# Patient Record
Sex: Female | Born: 1950 | Race: Asian | Hispanic: No | Smoking: Never smoker
Health system: Southern US, Community
[De-identification: ages and names within clinical notes are randomized; demographics above are authoritative.]

## PROBLEM LIST (undated history)

## (undated) DIAGNOSIS — E119 Type 2 diabetes mellitus without complications: Secondary | ICD-10-CM

## (undated) DIAGNOSIS — R0989 Other specified symptoms and signs involving the circulatory and respiratory systems: Secondary | ICD-10-CM

## (undated) DIAGNOSIS — I10 Essential (primary) hypertension: Secondary | ICD-10-CM

## (undated) DIAGNOSIS — E785 Hyperlipidemia, unspecified: Secondary | ICD-10-CM

## (undated) HISTORY — DX: Hyperlipidemia, unspecified: E78.5

## (undated) HISTORY — DX: Essential (primary) hypertension: I10

## (undated) HISTORY — DX: Type 2 diabetes mellitus without complications: E11.9

## (undated) HISTORY — DX: Other specified symptoms and signs involving the circulatory and respiratory systems: R09.89

---

## 2000-11-11 ENCOUNTER — Ambulatory Visit (HOSPITAL_COMMUNITY): Admission: RE | Admit: 2000-11-11 | Discharge: 2000-11-11 | Payer: Self-pay | Admitting: Obstetrics & Gynecology

## 2001-10-22 ENCOUNTER — Encounter: Payer: Self-pay | Admitting: Internal Medicine

## 2001-10-22 ENCOUNTER — Observation Stay (HOSPITAL_COMMUNITY): Admission: AD | Admit: 2001-10-22 | Discharge: 2001-10-23 | Payer: Self-pay | Admitting: Internal Medicine

## 2001-11-02 ENCOUNTER — Ambulatory Visit (HOSPITAL_COMMUNITY): Admission: RE | Admit: 2001-11-02 | Discharge: 2001-11-02 | Payer: Self-pay | Admitting: Gastroenterology

## 2001-11-12 ENCOUNTER — Ambulatory Visit (HOSPITAL_COMMUNITY): Admission: RE | Admit: 2001-11-12 | Discharge: 2001-11-12 | Payer: Self-pay | Admitting: Internal Medicine

## 2002-11-22 ENCOUNTER — Encounter: Payer: Self-pay | Admitting: Internal Medicine

## 2002-11-22 ENCOUNTER — Ambulatory Visit (HOSPITAL_COMMUNITY): Admission: RE | Admit: 2002-11-22 | Discharge: 2002-11-22 | Payer: Self-pay | Admitting: Internal Medicine

## 2002-11-23 ENCOUNTER — Other Ambulatory Visit: Admission: RE | Admit: 2002-11-23 | Discharge: 2002-11-23 | Payer: Self-pay | Admitting: Obstetrics and Gynecology

## 2003-12-01 ENCOUNTER — Ambulatory Visit (HOSPITAL_COMMUNITY): Admission: RE | Admit: 2003-12-01 | Discharge: 2003-12-01 | Payer: Self-pay | Admitting: Internal Medicine

## 2003-12-07 ENCOUNTER — Other Ambulatory Visit: Admission: RE | Admit: 2003-12-07 | Discharge: 2003-12-07 | Payer: Self-pay | Admitting: Obstetrics and Gynecology

## 2004-12-13 ENCOUNTER — Ambulatory Visit (HOSPITAL_COMMUNITY): Admission: RE | Admit: 2004-12-13 | Discharge: 2004-12-13 | Payer: Self-pay | Admitting: Internal Medicine

## 2004-12-24 ENCOUNTER — Other Ambulatory Visit: Admission: RE | Admit: 2004-12-24 | Discharge: 2004-12-24 | Payer: Self-pay | Admitting: Obstetrics and Gynecology

## 2005-06-17 DIAGNOSIS — I10 Essential (primary) hypertension: Secondary | ICD-10-CM

## 2005-06-17 HISTORY — DX: Essential (primary) hypertension: I10

## 2005-12-16 ENCOUNTER — Ambulatory Visit (HOSPITAL_COMMUNITY): Admission: RE | Admit: 2005-12-16 | Discharge: 2005-12-16 | Payer: Self-pay | Admitting: Internal Medicine

## 2005-12-25 ENCOUNTER — Other Ambulatory Visit: Admission: RE | Admit: 2005-12-25 | Discharge: 2005-12-25 | Payer: Self-pay | Admitting: Obstetrics and Gynecology

## 2006-12-22 ENCOUNTER — Ambulatory Visit (HOSPITAL_COMMUNITY): Admission: RE | Admit: 2006-12-22 | Discharge: 2006-12-22 | Payer: Self-pay | Admitting: Internal Medicine

## 2006-12-29 ENCOUNTER — Other Ambulatory Visit: Admission: RE | Admit: 2006-12-29 | Discharge: 2006-12-29 | Payer: Self-pay | Admitting: Obstetrics and Gynecology

## 2008-01-11 ENCOUNTER — Ambulatory Visit (HOSPITAL_COMMUNITY): Admission: RE | Admit: 2008-01-11 | Discharge: 2008-01-11 | Payer: Self-pay | Admitting: Internal Medicine

## 2009-01-26 ENCOUNTER — Ambulatory Visit (HOSPITAL_COMMUNITY): Admission: RE | Admit: 2009-01-26 | Discharge: 2009-01-26 | Payer: Self-pay | Admitting: Internal Medicine

## 2010-02-27 ENCOUNTER — Ambulatory Visit (HOSPITAL_COMMUNITY): Admission: RE | Admit: 2010-02-27 | Discharge: 2010-02-27 | Payer: Self-pay | Admitting: Internal Medicine

## 2010-07-09 ENCOUNTER — Encounter: Payer: Self-pay | Admitting: Internal Medicine

## 2010-11-02 NOTE — Procedures (Signed)
[place]  Patient:    Cindy Ross, Cindy Ross Visit Number: [id number] MRN: [id number]          Service Type: END Location: ENDO Attending Physician:  Charna Elizabeth Dictated by:   Anselmo Rod, M.D. Proc. Date: 11/02/01 Admit Date:  11/02/2001 Discharge Date: 11/02/2001   CC:         Kern Reap, M.D.   Procedure Report  DATE OF BIRTH:  04-Feb-1951  REFERRING PHYSICIAN:  Kern Reap, M.D.  PROCEDURE PERFORMED:  Esophagogastroduodenoscopy.  ENDOSCOPIST:  Anselmo Rod, M.D.  INSTRUMENT USED:  Olympus video panendoscope.  INDICATIONS FOR PROCEDURE:  Iron deficiency anemia with hemoglobin down to 6.1 gm/dl requiring three units for packed cells in a 60 year old Asian female.  Rule out peptic ulcer disease, esophagitis, gastritis, etc.  PREPROCEDURE PREPARATION:  Informed consent was procured from the patient. The patient was fasted for eight hours prior to the procedure.  PREPROCEDURE PHYSICAL:  The patient had stable vital signs.  Neck supple. Chest clear to auscultation.  S1, S2 regular.  Abdomen soft with normal abdominal bowel sounds.  DESCRIPTION OF PROCEDURE:  The patient was placed in left lateral decubitus position and sedated with 70 mg of Demerol and 7 mg of Versed intravenously. Once the patient was adequately sedated and maintained on low-flow oxygen and continuous cardiac monitoring, the Olympus video panendoscope was advanced through the mouthpiece, over the tongue, into the esophagus under direct vision.  The entire esophagus appeared normal and without lesions.  On advancing the scope into the stomach, a small hiatal hernia was seen on high retroflexion.  The rest of the gastric mucosa and the proximal small bowel appeared normal.  IMPRESSION:  Normal esophagogastroduodenoscopy except for a small hiatal hernia.  RECOMMENDATION: 1. Proceed with a colonoscopy at this time. Dictated by:   Anselmo Rod,  M.D. Attending Physician:  Charna Elizabeth DD:  11/03/01 TD:  11/04/01 Job: [id number]

## 2010-11-02 NOTE — Procedures (Signed)
[place]  Patient:    Cindy Ross, Cindy Ross Visit Number: [id number] MRN: [id number]          Service Type: END Location: ENDO Attending Physician:  Charna Elizabeth Dictated by:   Anselmo Rod, M.D. Proc. Date: 11/02/01 Admit Date:  11/02/2001 Discharge Date: 11/02/2001   CC:         Kern Reap, M.D.   Procedure Report  DATE OF BIRTH:  Dec 03, 1950  PROCEDURE PERFORMED:  Colonoscopy.  ENDOSCOPIST:  Anselmo Rod, M.D.  INSTRUMENT USED:  Olympus video colonoscope (adjustable pediatric scope).  INDICATIONS FOR PROCEDURE:  Iron-deficiency anemia in a 60 year old Asian female, rule out colonic polyps, masses, hemorrhoids, etc.  PREPROCEDURE PREPARATION:  Informed consent was procured from the patient. The patient was fasted for eight hours prior to the procedure, and prepped with a bottle of magnesium citrate and a gallon of NuLytely the night prior to the procedure.  PREPROCEDURE PHYSICAL:  VITAL SIGNS:  Stable.  NECK:  Supple.  CHEST:  Clear to auscultation, S1 and S2 regular.  ABDOMEN:  Soft with normal bowel sounds.  DESCRIPTION OF PROCEDURE:  The patient was placed in the left lateral decubitus position and sedated with Demerol and Versed for the EGD.  No additional sedation was used for the colonoscopy.  Once the patient was adequately sedated, maintained on low flow oxygen and continuous cardiac monitoring, the Olympus video colonoscope was advanced from the rectum to the cecum and terminal ileum without difficulty, except for nonbleeding small internal hemorrhoids seen on retroflexion.  The entire colonic mucosa up to the cecum appeared healthy.  The terminal ileum was normal as well as without lesions.  IMPRESSION: 1. Normal colonoscopy up to the terminal ileum. 2. Small nonbleeding internal hemorrhoids.  RECOMMENDATIONS: 1. A gynecological evaluation has been recommended for the patient. 2. Continue serial CBCs. 3.  Outpatient follow up in the next seven to ten days. Dictated by:   Anselmo Rod, M.D. Attending Physician:  Charna Elizabeth DD:  11/03/01 TD:  11/04/01 Job: [id number]

## 2011-02-07 ENCOUNTER — Other Ambulatory Visit (HOSPITAL_COMMUNITY): Payer: Self-pay | Admitting: Family Medicine

## 2011-02-07 DIAGNOSIS — Z1231 Encounter for screening mammogram for malignant neoplasm of breast: Secondary | ICD-10-CM

## 2011-03-04 ENCOUNTER — Ambulatory Visit (HOSPITAL_COMMUNITY)
Admission: RE | Admit: 2011-03-04 | Discharge: 2011-03-04 | Disposition: A | Discharge status: Home / Self Care | Payer: BC Managed Care – PPO | Source: Ambulatory Visit | Attending: Family Medicine | Admitting: Family Medicine

## 2011-03-04 DIAGNOSIS — Z1231 Encounter for screening mammogram for malignant neoplasm of breast: Secondary | ICD-10-CM | POA: Insufficient documentation

## 2012-03-03 ENCOUNTER — Other Ambulatory Visit (HOSPITAL_COMMUNITY): Payer: Self-pay | Admitting: Internal Medicine

## 2012-03-03 DIAGNOSIS — Z1231 Encounter for screening mammogram for malignant neoplasm of breast: Secondary | ICD-10-CM

## 2012-03-13 ENCOUNTER — Ambulatory Visit (HOSPITAL_COMMUNITY)
Admission: RE | Admit: 2012-03-13 | Discharge: 2012-03-13 | Disposition: A | Discharge status: Home / Self Care | Payer: BC Managed Care – PPO | Source: Ambulatory Visit | Attending: Internal Medicine | Admitting: Internal Medicine

## 2012-03-13 DIAGNOSIS — Z1231 Encounter for screening mammogram for malignant neoplasm of breast: Secondary | ICD-10-CM | POA: Insufficient documentation

## 2013-04-29 ENCOUNTER — Other Ambulatory Visit (HOSPITAL_COMMUNITY): Payer: Self-pay | Admitting: Internal Medicine

## 2013-04-29 DIAGNOSIS — Z1231 Encounter for screening mammogram for malignant neoplasm of breast: Secondary | ICD-10-CM

## 2013-05-19 ENCOUNTER — Ambulatory Visit (HOSPITAL_COMMUNITY)
Admission: RE | Admit: 2013-05-19 | Discharge: 2013-05-19 | Disposition: A | Discharge status: Home / Self Care | Payer: Self-pay | Source: Ambulatory Visit | Attending: Internal Medicine | Admitting: Internal Medicine

## 2013-05-19 DIAGNOSIS — Z1231 Encounter for screening mammogram for malignant neoplasm of breast: Secondary | ICD-10-CM

## 2013-11-25 ENCOUNTER — Other Ambulatory Visit: Payer: Self-pay | Admitting: Physician Assistant

## 2013-11-25 ENCOUNTER — Ambulatory Visit (INDEPENDENT_AMBULATORY_CARE_PROVIDER_SITE_OTHER): Payer: BC Managed Care – PPO | Admitting: Family Medicine

## 2013-11-25 ENCOUNTER — Ambulatory Visit (INDEPENDENT_AMBULATORY_CARE_PROVIDER_SITE_OTHER): Payer: BC Managed Care – PPO

## 2013-11-25 VITALS — BP 125/78 | HR 60 | Temp 97.7°F | Resp 18 | Ht 61.0 in | Wt 146.0 lb

## 2013-11-25 DIAGNOSIS — M25561 Pain in right knee: Secondary | ICD-10-CM

## 2013-11-25 DIAGNOSIS — M25569 Pain in unspecified knee: Secondary | ICD-10-CM

## 2013-11-25 MED ORDER — MELOXICAM 15 MG PO TABS: 15.0000 mg | ORAL_TABLET | Freq: Every day | ORAL | Status: DC

## 2013-11-25 NOTE — Progress Notes (Signed)
   Subjective:    Patient ID: Cindy Ross, female    DOB: 10/01/1950, 63 y.o.   MRN: [id number]  HPI   Cindy Ross is a very pleasant 62 yr old female here with complaint of RIGHT knee pain for 1 week.  She woke up one morning with knee pain - could hardly walk due to pain - ?locking, catching but pt cannot articulate this.  There is a language barrier.  She denies injury to the knee.  She denies prior knee problems.  She has not currently having difficulty walking.  She works at [employer] and is on her feet during the day.  She has not taken any medicine for the knee.  She otherwise feels well today.     Review of Systems  Constitutional: Negative for fever and chills.  Respiratory: Negative.   Cardiovascular: Negative.   Gastrointestinal: Negative.   Musculoskeletal: Positive for arthralgias.  Skin: Negative.        Objective:   Physical Exam  Vitals reviewed. Constitutional: She is oriented to person, place, and time. She appears well-developed and well-nourished. No distress.  HENT:  Head: Normocephalic and atraumatic.  Eyes: Conjunctivae are normal. No scleral icterus.  Cardiovascular: Intact distal pulses.   Pulmonary/Chest: Effort normal.  Musculoskeletal:       Right knee: She exhibits swelling (slight, compared with left). She exhibits normal range of motion, no ecchymosis, no deformity, no erythema and no LCL laxity. Tenderness (anterior knee) found.       Legs: Full PROM; negative anterior drawer; negative McMurray's; no laxity; mild TTP anterior knee; full WB; normal gait  Neurological: She is alert and oriented to person, place, and time.  Skin: Skin is warm and dry.  Psychiatric: She has a normal mood and affect. Her behavior is normal.     [facility] reading (PRIMARY) by  Dr. Nyoka Cowden - well preserved joint space; ?flabella vs bony island off lateral femoral condyle; lateral patellar tilt       Assessment & Plan:  Right knee pain - Plan: meloxicam (MOBIC) 15 MG tablet,  CANCELED: DG Knee Complete 4 Views Right   Cindy Ross is a very pleasant 63 yr old female here with 1 wk of RIGHT knee pain.  Suspect degenerative disease.  Xrays with no acute findings today.  Will start mobic once daily.  Pt placed in hinged knee brace.  If she is worsening or not improving in the next 1-2 weeks she will RTC  Pt to call or RTC if worsening or not improving  E. Natividad Brood MHS, PA-C [facility] 6/11/201512:53 PM

## 2013-11-25 NOTE — Patient Instructions (Signed)
Your xrays of your knee look ok today  Wear the knee brace for about the next week  Take the meloxicam (Mobic) once daily for the next 1-2 weeks.  Do not take any additional ibuprofen or aleve with this medicine  Apply ice to the need 1-2 times per day  If your knee is worsening or not getting better, please come back   Knee Pain Knee pain can be a result of an injury or other medical conditions. Treatment will depend on the cause of your pain. HOME CARE  Only take medicine as told by your doctor.  Keep a healthy weight. Being overweight can make the knee hurt more.  Stretch before exercising or playing sports.  If there is constant knee pain, change the way you exercise. Ask your doctor for advice.  Make sure shoes fit well. Choose the right shoe for the sport or activity.  Protect your knees. Wear kneepads if needed.  Rest when you are tired. GET HELP RIGHT AWAY IF:   Your knee pain does not stop.  Your knee pain does not get better.  Your knee joint feels hot to the touch.  You have a fever. MAKE SURE YOU:   Understand these instructions.  Will watch this condition.  Will get help right away if you are not doing well or get worse. Document Released: 08/30/2008 Document Revised: 08/26/2011 Document Reviewed: 08/30/2008 [facility] Patient Information 2014 [place], [place].

## 2013-11-25 NOTE — Progress Notes (Signed)
Xray read and patient discussed with Ms. Elana Alm. Agree with assessment and plan of care per her note.

## 2014-04-18 ENCOUNTER — Other Ambulatory Visit (HOSPITAL_COMMUNITY): Payer: Self-pay | Admitting: Internal Medicine

## 2014-04-18 DIAGNOSIS — Z1231 Encounter for screening mammogram for malignant neoplasm of breast: Secondary | ICD-10-CM

## 2014-05-23 ENCOUNTER — Ambulatory Visit (HOSPITAL_COMMUNITY)
Admission: RE | Admit: 2014-05-23 | Discharge: 2014-05-23 | Disposition: A | Discharge status: Home / Self Care | Payer: BC Managed Care – PPO | Source: Ambulatory Visit | Attending: Internal Medicine | Admitting: Internal Medicine

## 2014-05-23 DIAGNOSIS — Z1231 Encounter for screening mammogram for malignant neoplasm of breast: Secondary | ICD-10-CM | POA: Insufficient documentation

## 2015-04-17 ENCOUNTER — Other Ambulatory Visit: Payer: Self-pay

## 2015-04-17 DIAGNOSIS — Z1231 Encounter for screening mammogram for malignant neoplasm of breast: Secondary | ICD-10-CM

## 2015-05-25 ENCOUNTER — Ambulatory Visit
Admission: RE | Admit: 2015-05-25 | Discharge: 2015-05-25 | Disposition: A | Discharge status: Home / Self Care | Payer: BLUE CROSS/BLUE SHIELD | Source: Ambulatory Visit

## 2015-05-25 DIAGNOSIS — Z1231 Encounter for screening mammogram for malignant neoplasm of breast: Secondary | ICD-10-CM

## 2015-11-28 ENCOUNTER — Encounter: Payer: Self-pay | Admitting: Internal Medicine

## 2015-11-28 ENCOUNTER — Ambulatory Visit (INDEPENDENT_AMBULATORY_CARE_PROVIDER_SITE_OTHER): Payer: Self-pay | Admitting: Internal Medicine

## 2015-11-28 VITALS — BP 150/100 | HR 80 | Resp 16 | Ht 61.0 in | Wt 141.0 lb

## 2015-11-28 DIAGNOSIS — K029 Dental caries, unspecified: Secondary | ICD-10-CM

## 2015-11-28 DIAGNOSIS — E118 Type 2 diabetes mellitus with unspecified complications: Secondary | ICD-10-CM

## 2015-11-28 DIAGNOSIS — I10 Essential (primary) hypertension: Secondary | ICD-10-CM

## 2015-11-28 LAB — GLUCOSE, POCT (MANUAL RESULT ENTRY): POC Glucose: 142 mg/dl — AB (ref 70–99)

## 2015-11-28 MED ORDER — GLUCOSE BLOOD VI STRP: ORAL_STRIP | Status: DC

## 2015-11-28 MED ORDER — METFORMIN HCL 1000 MG PO TABS: 1000.0000 mg | ORAL_TABLET | Freq: Two times a day (BID) | ORAL | Status: DC

## 2015-11-28 MED ORDER — LOSARTAN POTASSIUM-HCTZ 50-12.5 MG PO TABS: 1.0000 | ORAL_TABLET | Freq: Every day | ORAL | Status: DC

## 2015-11-28 MED ORDER — AGAMATRIX PRESTO PRO METER DEVI: Status: DC

## 2015-11-28 NOTE — Progress Notes (Signed)
   Subjective:    Patient ID: Cindy Ross, female    DOB: 06-08-1951, 65 y.o.   MRN: [id number]  HPI   New patient to establish. Previously follow by Dr. Priscille Loveless  1.  Essential Hypertension:  For over 10 years.  Has not taken medication for about 3 years. Called [facility] and they state she was on Losartan/HCTZ 50/12.5 mg  2.  DM Type 2:  Unknown length of time.  Has not taken Metformin 1000 mg twice daily since about 12.2016. Last eye check with Dr. Alois Cliche [phone].  Was told everything was fine. Did receive a flu vaccine this past winter Does not believe she has had a pneumovax. Denies renal, eye, cardiac, neurologic injury due to DM. Last blood work in October 2016.  Meds:  None currently  Allergies:  NKDA--though later gives a history that sounds like an ACE I cough  Past Medical History  Diagnosis Date  . Diabetes mellitus without complication ([place])   . Hypertension 2007   No past surgical history on file.   Family History  Problem Relation Age of Onset  . Hyperlipidemia Sister   . Diabetes Sister   . Hypertension Sister   . Heart disease Brother   . Liver disease Brother    Social History   Social History  . Marital Status: Widowed    Spouse Name: N/A  . Number of Children: 1  . Years of Education: 12   Occupational History  . unemployed     Worked for [employer] in past   Social History Main Topics  . Smoking status: Never Smoker   . Smokeless tobacco: Never Used  . Alcohol Use: No  . Drug Use: No  . Sexual Activity: No   Other Topics Concern  . Not on file   Social History Narrative   Originally from [place]   Came to [place]. In 16   Lives with younger sister, Freida Timmermans         Review of Systems     Objective:   Physical Exam NAD HEENT:  Pinguecula, left nasal iris with arcus senilis bilaterally.  TMs pearly gray, throat without injection, diffuse dental decay. Neck:  Supple, no adenopathy no thyromegaly Chest:  CTA CV:  RRR with normal  S1 and S2, No S3, S4 or murmur, no carotid bruits, carotid, radial and DP pulses normal and equal. LE:  No edema       Assessment & Plan:  1.  Essential Hypertension:  Restart LOsartan/HCT at [place].  CMP in [redacted] week along with other fasting labs  2.  DM type 2:  Restart Metformin.  A1C and FLP in 1 week  3.  Dental decay:  Dental referral  Send for old records

## 2015-12-07 ENCOUNTER — Other Ambulatory Visit (INDEPENDENT_AMBULATORY_CARE_PROVIDER_SITE_OTHER): Payer: Self-pay

## 2015-12-07 VITALS — BP 124/70 | HR 66

## 2015-12-07 DIAGNOSIS — I10 Essential (primary) hypertension: Secondary | ICD-10-CM

## 2015-12-07 DIAGNOSIS — E118 Type 2 diabetes mellitus with unspecified complications: Secondary | ICD-10-CM

## 2015-12-08 LAB — COMPREHENSIVE METABOLIC PANEL
ALT: 60 IU/L — ABNORMAL HIGH (ref 0–32)
AST: 42 IU/L — ABNORMAL HIGH (ref 0–40)
Albumin/Globulin Ratio: 1.2 (ref 1.2–2.2)
Albumin: 4.3 g/dL (ref 3.6–4.8)
Alkaline Phosphatase: 91 IU/L (ref 39–117)
BUN/Creatinine Ratio: 25 (ref 12–28)
BUN: 13 mg/dL (ref 8–27)
Bilirubin Total: 0.5 mg/dL (ref 0.0–1.2)
CO2: 21 mmol/L (ref 18–29)
Calcium: 9.9 mg/dL (ref 8.7–10.3)
Chloride: 99 mmol/L (ref 96–106)
Creatinine, Ser: 0.53 mg/dL — ABNORMAL LOW (ref 0.57–1.00)
GFR calc Af Amer: 117 mL/min/{1.73_m2} (ref >59)
GFR calc non Af Amer: 101 mL/min/{1.73_m2} (ref >59)
Globulin, Total: 3.5 g/dL (ref 1.5–4.5)
Glucose: 131 mg/dL — ABNORMAL HIGH (ref 65–99)
Potassium: 3.9 mmol/L (ref 3.5–5.2)
Sodium: 140 mmol/L (ref 134–144)
Total Protein: 7.8 g/dL (ref 6.0–8.5)

## 2015-12-08 LAB — HGB A1C W/O EAG: Hgb A1c MFr Bld: 9.1 % — ABNORMAL HIGH (ref 4.8–5.6)

## 2015-12-08 LAB — LIPID PANEL W/O CHOL/HDL RATIO
Cholesterol, Total: 198 mg/dL (ref 100–199)
HDL: 34 mg/dL — ABNORMAL LOW (ref >39)
LDL Calculated: 100 mg/dL — ABNORMAL HIGH (ref 0–99)
Triglycerides: 319 mg/dL — ABNORMAL HIGH (ref 0–149)
VLDL Cholesterol Cal: 64 mg/dL — ABNORMAL HIGH (ref 5–40)

## 2016-01-30 ENCOUNTER — Encounter: Payer: Self-pay | Admitting: Internal Medicine

## 2016-01-30 ENCOUNTER — Ambulatory Visit (INDEPENDENT_AMBULATORY_CARE_PROVIDER_SITE_OTHER): Payer: Self-pay | Admitting: Internal Medicine

## 2016-01-30 VITALS — BP 120/70 | HR 68 | Resp 16 | Ht 61.0 in | Wt 138.0 lb

## 2016-01-30 DIAGNOSIS — E119 Type 2 diabetes mellitus without complications: Secondary | ICD-10-CM

## 2016-01-30 DIAGNOSIS — E785 Hyperlipidemia, unspecified: Secondary | ICD-10-CM

## 2016-01-30 DIAGNOSIS — I10 Essential (primary) hypertension: Secondary | ICD-10-CM

## 2016-01-30 DIAGNOSIS — E118 Type 2 diabetes mellitus with unspecified complications: Secondary | ICD-10-CM

## 2016-01-30 DIAGNOSIS — Z23 Encounter for immunization: Secondary | ICD-10-CM

## 2016-01-30 HISTORY — DX: Hyperlipidemia, unspecified: E78.5

## 2016-01-30 LAB — GLUCOSE, POCT (MANUAL RESULT ENTRY): POC Glucose: 116 mg/dl — AB (ref 70–99)

## 2016-01-30 NOTE — Patient Instructions (Signed)
Keep track of your sugars and bring them in at each visit.

## 2016-01-30 NOTE — Progress Notes (Signed)
Subjective:    Patient ID: Cindy Ross, female    DOB: March 07, 1951, 65 y.o.   MRN: [id number]  HPI   1.  DM Type 2:  Only checking sugars 3 times weekly.  Sugars when checked are never higher than 115, even in the evening.  Taking Metformin twice daily  No polydipsia. No polyuria.  Last eye check in December. Dr. Alois Cliche, [address]  No diabetic changes. Glasses working well for her Last Flu vaccine in September of 2016 Has never had a pneumovax No numbness or tingling in hands or feet.  2.  Essential Hypertension:  Taking Losartan HCTZ regularly.  No problems.  Walks about 1/2 hour 3 times daily.  3.  Elevated Liver Enzymes:  Mild with labs in June.  No history of jaundice with illness when younger.  No history of hepatitis.  Does not drink alcohol.  Hepatic Function Latest Ref Rng & Units 12/07/2015  Total Protein 6.0 - 8.5 g/dL 7.8  Albumin 3.6 - 4.8 g/dL 4.3  AST 0 - 40 IU/L 42(H)  ALT 0 - 32 IU/L 60(H)  Alk Phosphatase 39 - 117 IU/L 91  Total Bilirubin 0.0 - 1.2 mg/dL 0.5   4.  Hyperlipidemia:  Total just under goal.  Trigs and LDL high and HDL low.  This just as restarting meds.  Lipid Panel     Component Value Date/Time   CHOL 198 12/07/2015 0929   TRIG 319 (H) 12/07/2015 0929   HDL 34 (L) 12/07/2015 0929   LDLCALC 100 (H) 12/07/2015 0929   Current Meds  Medication Sig  . Blood Glucose Monitoring Suppl (AGAMATRIX PRESTO PRO METER) DEVI Check sugars twice daily  . cholecalciferol (VITAMIN D) 1000 units tablet Take 1,000 Units by mouth daily.  Marland Kitchen glucose blood (AGAMATRIX PRESTO TEST) test strip Twice daily glucose checks  . losartan-hydrochlorothiazide (HYZAAR) 50-12.5 MG tablet Take 1 tablet by mouth daily.  . metFORMIN (GLUCOPHAGE) 1000 MG tablet Take 1 tablet (1,000 mg total) by mouth 2 (two) times daily with a meal.  . Multiple Vitamins-Minerals (ONE-A-DAY WOMENS 50+ ADVANTAGE PO) Take 1 tablet by mouth daily at 12 noon.   No Known Allergies        Review of Systems     Objective:   Physical Exam  Lab Results  Component Value Date   POCGLU 116 (A) 01/30/2016   NAD HEENT:  Nasal pinguecula, left eye.  PERRL, EOMI Throat without injection Neck:  Supple, No adenopathy Chest:  CTA CV:  RRR without murmur or rub, radial, DP and PT pulses normal and equal Abd:  S, NT, No HSM or mass, + BS LE: No edema Feet:  Dry and mild callous formation along edges of great toes and feet.  No wounds, Toenails clear and healthy.  10 g monofilament testing WNL.  Diabetic Foot Exam - Simple   Simple Foot Form Diabetic Foot exam was performed with the following findings:  Yes 01/30/2016  9:00 AM  Visual Inspection Sensation Testing Pulse Check Comments           Assessment & Plan:  1.  DM:  Asked to check sugars twice daily and bring those documented next visit.  A1C end of Sept. Up to date with eye check Pneumovax at next visit.  2.  Essential Hypertension:  Well controlled.  3.  Elevated Liver Enzymes:  Mild.  Likely fatty liver, but will recheck in Sept and if still high, reevaluate.  4.  Dyslipidemia:  Recheck in September.  If not at goal, start Statin.  To continue to work on lifestyle changes.  5.  Health Maintenance:  Had CPE with pap and mammogram 12.2016.  Went to physician on [place]. [facility] shows her last Td was in 1996, so Tdap today We are out of Pneumovax 23 valent--will give at lab visit Sept 22nd.

## 2016-03-08 ENCOUNTER — Other Ambulatory Visit (INDEPENDENT_AMBULATORY_CARE_PROVIDER_SITE_OTHER): Payer: Self-pay

## 2016-03-08 DIAGNOSIS — E785 Hyperlipidemia, unspecified: Secondary | ICD-10-CM

## 2016-03-08 DIAGNOSIS — I10 Essential (primary) hypertension: Secondary | ICD-10-CM

## 2016-03-08 DIAGNOSIS — E119 Type 2 diabetes mellitus without complications: Secondary | ICD-10-CM

## 2016-03-09 LAB — COMPREHENSIVE METABOLIC PANEL
ALT: 46 IU/L — ABNORMAL HIGH (ref 0–32)
AST: 38 IU/L (ref 0–40)
Albumin/Globulin Ratio: 1.1 — ABNORMAL LOW (ref 1.2–2.2)
Albumin: 4 g/dL (ref 3.6–4.8)
Alkaline Phosphatase: 70 IU/L (ref 39–117)
BUN/Creatinine Ratio: 25 (ref 12–28)
BUN: 18 mg/dL (ref 8–27)
Bilirubin Total: 0.5 mg/dL (ref 0.0–1.2)
CO2: 24 mmol/L (ref 18–29)
Calcium: 9.2 mg/dL (ref 8.7–10.3)
Chloride: 101 mmol/L (ref 96–106)
Creatinine, Ser: 0.73 mg/dL (ref 0.57–1.00)
GFR calc Af Amer: 101 mL/min/{1.73_m2} (ref >59)
GFR calc non Af Amer: 88 mL/min/{1.73_m2} (ref >59)
Globulin, Total: 3.8 g/dL (ref 1.5–4.5)
Glucose: 114 mg/dL — ABNORMAL HIGH (ref 65–99)
Potassium: 4 mmol/L (ref 3.5–5.2)
Sodium: 141 mmol/L (ref 134–144)
Total Protein: 7.8 g/dL (ref 6.0–8.5)

## 2016-03-09 LAB — LIPID PANEL W/O CHOL/HDL RATIO
Cholesterol, Total: 201 mg/dL — ABNORMAL HIGH (ref 100–199)
HDL: 37 mg/dL — ABNORMAL LOW (ref >39)
LDL Calculated: 133 mg/dL — ABNORMAL HIGH (ref 0–99)
Triglycerides: 157 mg/dL — ABNORMAL HIGH (ref 0–149)
VLDL Cholesterol Cal: 31 mg/dL (ref 5–40)

## 2016-03-09 LAB — HGB A1C W/O EAG: Hgb A1c MFr Bld: 6.7 % — ABNORMAL HIGH (ref 4.8–5.6)

## 2016-03-09 MED ORDER — ATORVASTATIN CALCIUM 20 MG PO TABS: ORAL_TABLET | ORAL | 11 refills | Status: DC

## 2016-03-19 ENCOUNTER — Encounter: Payer: Self-pay | Admitting: Internal Medicine

## 2016-03-19 ENCOUNTER — Ambulatory Visit (INDEPENDENT_AMBULATORY_CARE_PROVIDER_SITE_OTHER): Payer: Self-pay | Admitting: Internal Medicine

## 2016-03-19 VITALS — BP 144/80 | HR 60 | Resp 16 | Ht 61.0 in | Wt 134.5 lb

## 2016-03-19 DIAGNOSIS — Z23 Encounter for immunization: Secondary | ICD-10-CM

## 2016-03-19 NOTE — Progress Notes (Signed)
   Subjective:    Patient ID: Cindy Ross, female    DOB: 02-16-1951, 65 y.o.   MRN: [id number]  HPI   1.  DM Type 2:  A1C dropped to 6.7% with last check 03/08/2016.    2.  Hyperlipidemia with high triglycerides and LDL and low HDL.  Triglycerides almost halved with recent check, though LDL up from 100 to 133 with labs in Sept.  Pt. Recommended to start Atorvastatin, which she has not apparently yet picked up.  She is to have FLP and liver panel 6 weeks after starting.  3.  Elevated Liver enzymes:  Also improving.  AST normalized and ALT dropped to 40 range from 60.  Immunization History  Administered Date(s) Administered  . Td 10/31/1994, 04/10/1995  . Tdap 01/30/2016    Current Meds  Medication Sig  . Blood Glucose Monitoring Suppl (AGAMATRIX PRESTO PRO METER) DEVI Check sugars twice daily  . cholecalciferol (VITAMIN D) 1000 units tablet Take 1,000 Units by mouth daily.  Marland Kitchen glucose blood (AGAMATRIX PRESTO TEST) test strip Twice daily glucose checks  . losartan-hydrochlorothiazide (HYZAAR) 50-12.5 MG tablet Take 1 tablet by mouth daily.  . metFORMIN (GLUCOPHAGE) 1000 MG tablet Take 1 tablet (1,000 mg total) by mouth 2 (two) times daily with a meal.   No Known Allergies  Review of Systems     Objective:   Physical Exam NAD LUngs:  CTA CV:  RRR without murmur or rub, radial pulses normal and equal. Abd:  S, NT, No HSM or mass, + BS LE:  No edema Diabetic Foot Exam - Simple   Simple Foot Form Diabetic Foot exam was performed with the following findings:  Yes 03/19/2016  9:15 AM  Visual Inspection No deformities, no ulcerations, no other skin breakdown bilaterally:  Yes Sensation Testing Intact to touch and monofilament testing bilaterally:  Yes Pulse Check Posterior Tibialis and Dorsalis pulse intact bilaterally:  Yes Comments Dry feet          Assessment & Plan:  1.  DM Type 2:  Much improved control.  Encourage lotion to feet nightly  2.  Essential Hypertension:   Up a bit today, follow for now without change to regimen.  If still up next check, consider increase in medication.  Controlled on current Losartan HCTZ previously  3.  Hyperlipidemia:  To pick up Atorvastatin with recheck of FLP and liver profile in 6 weeks.  4.  HM  PNeumovax 23 V  Given.  2.

## 2016-05-02 ENCOUNTER — Other Ambulatory Visit (INDEPENDENT_AMBULATORY_CARE_PROVIDER_SITE_OTHER): Payer: Self-pay | Admitting: Internal Medicine

## 2016-05-02 DIAGNOSIS — Z79899 Other long term (current) drug therapy: Secondary | ICD-10-CM

## 2016-05-02 DIAGNOSIS — E785 Hyperlipidemia, unspecified: Secondary | ICD-10-CM

## 2016-05-03 LAB — LIPID PANEL W/O CHOL/HDL RATIO
Cholesterol, Total: 155 mg/dL (ref 100–199)
HDL: 39 mg/dL — ABNORMAL LOW (ref >39)
LDL Calculated: 85 mg/dL (ref 0–99)
Triglycerides: 154 mg/dL — ABNORMAL HIGH (ref 0–149)
VLDL Cholesterol Cal: 31 mg/dL (ref 5–40)

## 2016-05-03 LAB — HEPATIC FUNCTION PANEL
ALT: 43 IU/L — ABNORMAL HIGH (ref 0–32)
AST: 40 IU/L (ref 0–40)
Albumin: 4.6 g/dL (ref 3.6–4.8)
Alkaline Phosphatase: 68 IU/L (ref 39–117)
Bilirubin Total: 0.6 mg/dL (ref 0.0–1.2)
Bilirubin, Direct: 0.14 mg/dL (ref 0.00–0.40)
Total Protein: 8 g/dL (ref 6.0–8.5)

## 2016-05-03 NOTE — Progress Notes (Signed)
Duplicate message. 

## 2016-05-03 NOTE — Progress Notes (Signed)
Discussed lab results with patient. 

## 2016-06-26 ENCOUNTER — Other Ambulatory Visit: Payer: Self-pay | Admitting: Internal Medicine

## 2016-06-26 DIAGNOSIS — Z1231 Encounter for screening mammogram for malignant neoplasm of breast: Secondary | ICD-10-CM

## 2016-06-27 ENCOUNTER — Ambulatory Visit
Admission: RE | Admit: 2016-06-27 | Discharge: 2016-06-27 | Disposition: A | Discharge status: Home / Self Care | Payer: No Typology Code available for payment source | Source: Ambulatory Visit | Attending: Internal Medicine | Admitting: Internal Medicine

## 2016-06-27 DIAGNOSIS — Z1231 Encounter for screening mammogram for malignant neoplasm of breast: Secondary | ICD-10-CM

## 2016-07-23 ENCOUNTER — Encounter: Payer: Self-pay | Admitting: Internal Medicine

## 2016-07-23 ENCOUNTER — Ambulatory Visit (INDEPENDENT_AMBULATORY_CARE_PROVIDER_SITE_OTHER): Payer: Self-pay | Admitting: Internal Medicine

## 2016-07-23 VITALS — BP 130/82 | HR 76 | Resp 12 | Ht 60.0 in | Wt 134.0 lb

## 2016-07-23 DIAGNOSIS — I10 Essential (primary) hypertension: Secondary | ICD-10-CM

## 2016-07-23 DIAGNOSIS — E782 Mixed hyperlipidemia: Secondary | ICD-10-CM

## 2016-07-23 DIAGNOSIS — E119 Type 2 diabetes mellitus without complications: Secondary | ICD-10-CM

## 2016-07-23 DIAGNOSIS — Z79899 Other long term (current) drug therapy: Secondary | ICD-10-CM

## 2016-07-23 LAB — GLUCOSE, POCT (MANUAL RESULT ENTRY): POC Glucose: 111 mg/dl — AB (ref 70–99)

## 2016-07-23 NOTE — Patient Instructions (Signed)
Mix [place] Foot Cream with a drop or two of Tea Tree Oil and massage into feet, especially callouses at bedtime nightly--do not rub in between toes

## 2016-07-23 NOTE — Progress Notes (Signed)
   Subjective:    Patient ID: Cindy Ross, female    DOB: 1951/03/02, 66 y.o.   MRN: [id number]  HPI   1.  DM:  Last A1C was 6.7% 03/08/16.  Checking sugars 2 times daily.  Highest sugar is 113.    Has not had eye check this year. Pneumovax up to date. Diabetic foot exam up to date. Not checking feet nightly.  2.  Health Maintenance:  Reportedly had last pap in 2016.  Always normal.  Had mammogram last month.  3.  Essential Hypertension:  Taking Losartan/HCTZ.  Does not miss.  4.  Hyperlipidemia:  Improved last check in November with Atorvastatin, though was not quite at goal.  Has started exercising regularly for about 30 minutes.  Feels her diet is good already, eating lots of fruits and veggies.  Fasting today.  Current Meds  Medication Sig  . atorvastatin (LIPITOR) 20 MG tablet 1 tab by mouth with evening meal daily  . Blood Glucose Monitoring Suppl (AGAMATRIX PRESTO PRO METER) DEVI Check sugars twice daily  . cholecalciferol (VITAMIN D) 1000 units tablet Take 1,000 Units by mouth daily.  Marland Kitchen glucose blood (AGAMATRIX PRESTO TEST) test strip Twice daily glucose checks  . losartan-hydrochlorothiazide (HYZAAR) 50-12.5 MG tablet Take 1 tablet by mouth daily.  . metFORMIN (GLUCOPHAGE) 1000 MG tablet Take 1 tablet (1,000 mg total) by mouth 2 (two) times daily with a meal.  . Multiple Vitamins-Minerals (ONE-A-DAY WOMENS 50+ ADVANTAGE PO) Take 1 tablet by mouth daily at 12 noon.    No Known Allergies    Review of Systems     Objective:   Physical Exam NAD Lungs:  CTA CV:  RRR without murmur or rub, radial pulses normal and equal Abd:  S, NT, No HSM or mass, + BS Feet:  Extensive callus formation on posterior heel, 1st MTP area and great toes.  Feet dry.       Assessment & Plan:  1.  DM:   Has been well controlled.  Check A1C.  Eye referral.  2.  Hypertension:  BP okay.  Continue Losartan/HCTZ.  CMP  3.  Hyperlipidemia:  Atorvastatin.  FLP.  Increase medication if not yet at  goals this time.  4.  HM:  Schedule CPE without pap.  Mammogram done.   Encouraged obtaining flu vaccine.

## 2016-07-24 LAB — COMPREHENSIVE METABOLIC PANEL
ALT: 40 IU/L — ABNORMAL HIGH (ref 0–32)
AST: 30 IU/L (ref 0–40)
Albumin/Globulin Ratio: 1.3 (ref 1.2–2.2)
Albumin: 4.5 g/dL (ref 3.6–4.8)
Alkaline Phosphatase: 68 IU/L (ref 39–117)
BUN/Creatinine Ratio: 33 — ABNORMAL HIGH (ref 12–28)
BUN: 24 mg/dL (ref 8–27)
Bilirubin Total: 0.5 mg/dL (ref 0.0–1.2)
CO2: 23 mmol/L (ref 18–29)
Calcium: 9.2 mg/dL (ref 8.7–10.3)
Chloride: 100 mmol/L (ref 96–106)
Creatinine, Ser: 0.73 mg/dL (ref 0.57–1.00)
GFR calc Af Amer: 101 mL/min/{1.73_m2} (ref >59)
GFR calc non Af Amer: 88 mL/min/{1.73_m2} (ref >59)
Globulin, Total: 3.4 g/dL (ref 1.5–4.5)
Glucose: 110 mg/dL — ABNORMAL HIGH (ref 65–99)
Potassium: 4.3 mmol/L (ref 3.5–5.2)
Sodium: 140 mmol/L (ref 134–144)
Total Protein: 7.9 g/dL (ref 6.0–8.5)

## 2016-07-24 LAB — LIPID PANEL W/O CHOL/HDL RATIO
Cholesterol, Total: 131 mg/dL (ref 100–199)
HDL: 40 mg/dL (ref >39)
LDL Calculated: 65 mg/dL (ref 0–99)
Triglycerides: 129 mg/dL (ref 0–149)
VLDL Cholesterol Cal: 26 mg/dL (ref 5–40)

## 2016-07-24 LAB — HGB A1C W/O EAG: Hgb A1c MFr Bld: 6.3 % — ABNORMAL HIGH (ref 4.8–5.6)

## 2016-07-24 NOTE — Progress Notes (Signed)
Spoke with patient. Informed patient of lab results

## 2016-07-31 ENCOUNTER — Ambulatory Visit (INDEPENDENT_AMBULATORY_CARE_PROVIDER_SITE_OTHER): Payer: Self-pay

## 2016-07-31 DIAGNOSIS — Z23 Encounter for immunization: Secondary | ICD-10-CM

## 2016-10-24 ENCOUNTER — Other Ambulatory Visit: Payer: Self-pay

## 2016-10-24 DIAGNOSIS — I10 Essential (primary) hypertension: Secondary | ICD-10-CM

## 2016-10-24 MED ORDER — LOSARTAN POTASSIUM-HCTZ 50-12.5 MG PO TABS: 1.0000 | ORAL_TABLET | Freq: Every day | ORAL | 11 refills | Status: DC

## 2016-11-18 ENCOUNTER — Encounter: Payer: Self-pay | Admitting: Internal Medicine

## 2016-11-18 ENCOUNTER — Ambulatory Visit (INDEPENDENT_AMBULATORY_CARE_PROVIDER_SITE_OTHER): Payer: Self-pay | Admitting: Internal Medicine

## 2016-11-18 VITALS — BP 118/70 | HR 80 | Resp 12 | Ht 60.0 in | Wt 133.0 lb

## 2016-11-18 DIAGNOSIS — Z79899 Other long term (current) drug therapy: Secondary | ICD-10-CM

## 2016-11-18 DIAGNOSIS — E118 Type 2 diabetes mellitus with unspecified complications: Secondary | ICD-10-CM

## 2016-11-18 DIAGNOSIS — Z0000 Encounter for general adult medical examination without abnormal findings: Secondary | ICD-10-CM

## 2016-11-18 DIAGNOSIS — R918 Other nonspecific abnormal finding of lung field: Secondary | ICD-10-CM

## 2016-11-18 DIAGNOSIS — R748 Abnormal levels of other serum enzymes: Secondary | ICD-10-CM

## 2016-11-18 DIAGNOSIS — E782 Mixed hyperlipidemia: Secondary | ICD-10-CM

## 2016-11-18 DIAGNOSIS — I10 Essential (primary) hypertension: Secondary | ICD-10-CM

## 2016-11-18 LAB — GLUCOSE, POCT (MANUAL RESULT ENTRY): POC Glucose: 121 mg/dl — AB (ref 70–99)

## 2016-11-18 MED ORDER — METFORMIN HCL 1000 MG PO TABS: 1000.0000 mg | ORAL_TABLET | Freq: Two times a day (BID) | ORAL | 11 refills | Status: DC

## 2016-11-18 MED ORDER — GLUCOSE BLOOD VI STRP: ORAL_STRIP | 11 refills | Status: DC

## 2016-11-18 MED ORDER — ASPIRIN EC 81 MG PO TBEC: 81.0000 mg | DELAYED_RELEASE_TABLET | Freq: Every day | ORAL | Status: AC

## 2016-11-18 MED ORDER — LOSARTAN POTASSIUM-HCTZ 50-12.5 MG PO TABS: 1.0000 | ORAL_TABLET | Freq: Every day | ORAL | 11 refills | Status: DC

## 2016-11-18 MED ORDER — ATORVASTATIN CALCIUM 20 MG PO TABS: ORAL_TABLET | ORAL | 11 refills | Status: DC

## 2016-11-18 NOTE — Patient Instructions (Signed)
Can google "advance directives, [place]"  And bring up form from Secretary of State. Print and fill out Or can go to "5 wishes"  Which is also in Spanish and fill out--this costs $5--perhaps easier to use. Designate a Medical Power of Attorney to speak for you if you are unable to speak for yourself when ill or injured  

## 2016-11-18 NOTE — Progress Notes (Addendum)
Subjective:    Patient ID: Cindy Ross, female    DOB: 1950/09/03, 66 y.o.   MRN: [id number]  HPI   CPE without pap  1.  Pap:  States last was 2016 with a Dr. Quentin Cornwall on [place].  Always normal.  No family history.  2.  Mammogram:  Last mammogram was 06/27/16 and normal.  Always normal.  No family history.    3.  Osteoprevention:  Feels she had DXA done with Dr Quentin Cornwall 3 years ago and was told it was normal.  Little dairy.  Takes Calcium, but cannot say how much.  Takes cholecalciferol 1000 units daily.  Walks or runs about 30 minutes every day.   4.  Guaiac Cards:  Not clear if she has performed these before.  No family history of colon cancer.  5.  Colonoscopy:  Does not think she has had this done.  6.  Immunizations: Immunization History  Administered Date(s) Administered  . Influenza Inj Mdck Quad Pf 07/31/2016  . Pneumococcal Polysaccharide-23 03/19/2016  . Td 10/31/1994, 04/10/1995  . Tdap 01/30/2016     7.  Glucose/Cholesterol:  Has DM and Hyperlipidemia.  These are well controlled with last lab testing in February 2018.  Lipid Panel     Component Value Date/Time   CHOL 131 07/23/2016 0916   TRIG 129 07/23/2016 0916   HDL 40 07/23/2016 0916   LDLCALC 65 07/23/2016 0916   A1C 6.3% same date  Current Meds  Medication Sig  . atorvastatin (LIPITOR) 20 MG tablet 1 tab by mouth with evening meal daily  . Blood Glucose Monitoring Suppl (AGAMATRIX PRESTO PRO METER) DEVI Check sugars twice daily  . cholecalciferol (VITAMIN D) 1000 units tablet Take 1,000 Units by mouth daily.  Marland Kitchen glucose blood (AGAMATRIX PRESTO TEST) test strip Twice daily glucose checks  . losartan-hydrochlorothiazide (HYZAAR) 50-12.5 MG tablet Take 1 tablet by mouth daily.  . metFORMIN (GLUCOPHAGE) 1000 MG tablet Take 1 tablet (1,000 mg total) by mouth 2 (two) times daily with a meal.  . Multiple Vitamins-Minerals (ONE-A-DAY WOMENS 50+ ADVANTAGE PO) Take 1 tablet by mouth daily at 12 noon.     No Known Allergies   Past Medical History:  Diagnosis Date  . Diabetes mellitus without complication ([place])   . Hyperlipidemia 01/30/2016  . Hypertension 2007    History reviewed. No pertinent surgical history.   Family History  Problem Relation Age of Onset  . Hyperlipidemia Sister   . Diabetes Sister   . Hypertension Sister   . Heart disease Brother   . Liver disease Brother     Social History   Social History  . Marital status: Widowed    Spouse name: N/A  . Number of children: 1  . 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 Smoking status: Never Smoker  . Smokeless tobacco: Never Used  . Alcohol use No  . Drug use: No  . Sexual activity: No   Other Topics Concern  . Not on file   Social History Narrative   Originally from [place]   Came to [place]. In 41   Lives with younger sister, Khaleelah Yowell   Son lives in [place].  Works in [employer].       Review of Systems  Constitutional: Negative for appetite change, fatigue, fever and unexpected weight change.  HENT: Positive for dental problem (cavities).  Negative for ear pain, hearing loss, rhinorrhea, sneezing and sore throat.   Eyes: Positive for visual disturbance (wears bifocal--new Rx obtained in February). Negative for itching.  Respiratory: Negative for cough and shortness of breath.   Cardiovascular: Negative for chest pain, palpitations and leg swelling.  Gastrointestinal: Negative for abdominal pain, blood in stool (no melena), constipation, diarrhea and nausea.  Genitourinary: Negative for dysuria, frequency and vaginal discharge.  Musculoskeletal: Negative for arthralgias.  Skin: Negative for rash.       No skin lesions with changes  Neurological: Negative for dizziness, weakness, numbness and headaches.  Hematological: Negative for adenopathy. Does not bruise/bleed easily.  Psychiatric/Behavioral:  Negative for dysphoric mood. The patient is not nervous/anxious.        Objective:   Physical Exam  Constitutional: She is oriented to person, place, and time. She appears well-developed and well-nourished.  HENT:  Head: Normocephalic and atraumatic.  Right Ear: Hearing, tympanic membrane, external ear and ear canal normal.  Left Ear: Hearing, tympanic membrane, external ear and ear canal normal.  Nose: Nose normal.  Mouth/Throat: Uvula is midline, oropharynx is clear and moist and mucous membranes are normal.  Missing and caried teeth.  Eyes: Conjunctivae and EOM are normal. Pupils are equal, round, and reactive to light.  Pterygium nasal left eye.  Does not involve pupil area.  Neck: Normal range of motion and full passive range of motion without pain. Neck supple. No thyroid mass and no thyromegaly present.  Cardiovascular: Normal rate, regular rhythm, S1 normal and S2 normal.  Exam reveals no S3, no S4 and no friction rub.   No murmur heard. No carotid bruits.  Carotid, radial, femoral, DP and PT pulses normal and equal.   Pulmonary/Chest: Effort normal. Right breast exhibits no inverted nipple, no mass, no nipple discharge, no skin change and no tenderness. Left breast exhibits no inverted nipple, no mass, no nipple discharge, no skin change and no tenderness.  Dry crackles left posterior base area.  Otherwise normal BS.  Resonant to percussion at bases bilaterally.  Abdominal: Soft. Bowel sounds are normal. She exhibits no mass. There is no hepatosplenomegaly. There is no tenderness. No hernia.  Genitourinary: Rectal exam shows guaiac negative stool.  Genitourinary Comments: Normal external genitalia. No vaginal discharge No uterine or adnexal mass or tenderness.   Rectal exam with large amount of soft stool palpable, but no mass.  Good anal sphincter tone.  Heme negative light brown stool.  Musculoskeletal: Normal range of motion.  Lymphadenopathy:       Head (right side): No  submental and no submandibular adenopathy present.       Head (left side): No submental and no submandibular adenopathy present.    She has no cervical adenopathy.    She has no axillary adenopathy.       Right: No inguinal and no supraclavicular adenopathy present.       Left: No inguinal and no supraclavicular adenopathy present.  Neurological: She is alert and oriented to person, place, and time. She has normal strength and normal reflexes. No cranial nerve deficit or sensory deficit. Coordination and gait normal.  Diabetic Foot Exam - Simple   Simple Foot Form Diabetic Foot exam was performed with the following findings:  Yes  11/18/2016 10:02 AM  Visual Inspection No deformities, no ulcerations, no other skin breakdown bilaterally:  Yes Sensation Testing Intact to touch and monofilament testing bilaterally:  Yes Pulse Check Posterior Tibialis and Dorsalis pulse intact bilaterally:  Yes Comments Some  dryness and callousing at edges of feet    Skin: Skin is warm and dry. No lesion and no rash noted.  Psychiatric: She has a normal mood and affect. Her speech is normal and behavior is normal. Judgment and thought content normal. Cognition and memory are normal.          Assessment & Plan:  1.  CPE without pap Guaiac Card packet to return in 2 weeks. If positive, will set up with colonoscopy, otherwise, unable to obtain through orange card. Immunizations up to date ASA 81 mg daily Hep C, HIV, CBC   2.  Hyperlipidemia:  FLP, hepatic profile.  Looking to see increase in HDL  3.  DM:  Has been very well controlled.  A1C, urine microalbumin/crea  4.  Essential Hypertension:  Controlled  5.  Abnormal lung exam:  Check CXR.

## 2016-11-18 NOTE — Addendum Note (Signed)
Addended by: Marcelino Duster on: 11/18/2016 10:09 AM   Modules accepted: Orders

## 2016-11-19 LAB — CBC WITH DIFFERENTIAL/PLATELET
Basophils Absolute: 0 10*3/uL (ref 0.0–0.2)
Basos: 1 %
EOS (ABSOLUTE): 0.6 10*3/uL — ABNORMAL HIGH (ref 0.0–0.4)
Eos: 9 %
Hematocrit: 38.2 % (ref 34.0–46.6)
Hemoglobin: 12.4 g/dL (ref 11.1–15.9)
Immature Grans (Abs): 0 10*3/uL (ref 0.0–0.1)
Immature Granulocytes: 0 %
Lymphocytes Absolute: 3.9 10*3/uL — ABNORMAL HIGH (ref 0.7–3.1)
Lymphs: 60 %
MCH: 30.5 pg (ref 26.6–33.0)
MCHC: 32.5 g/dL (ref 31.5–35.7)
MCV: 94 fL (ref 79–97)
Monocytes Absolute: 0.5 10*3/uL (ref 0.1–0.9)
Monocytes: 7 %
Neutrophils Absolute: 1.5 10*3/uL (ref 1.4–7.0)
Neutrophils: 23 %
Platelets: 250 10*3/uL (ref 150–379)
RBC: 4.07 x10E6/uL (ref 3.77–5.28)
RDW: 12.9 % (ref 12.3–15.4)
WBC: 6.5 10*3/uL (ref 3.4–10.8)

## 2016-11-19 LAB — HGB A1C W/O EAG: Hgb A1c MFr Bld: 6.3 % — ABNORMAL HIGH (ref 4.8–5.6)

## 2016-11-19 LAB — HEPATITIS C ANTIBODY: Hep C Virus Ab: 0.4 s/co ratio (ref 0.0–0.9)

## 2016-11-19 LAB — LIPID PANEL W/O CHOL/HDL RATIO
Cholesterol, Total: 131 mg/dL (ref 100–199)
HDL: 42 mg/dL (ref >39)
LDL Calculated: 66 mg/dL (ref 0–99)
Triglycerides: 114 mg/dL (ref 0–149)
VLDL Cholesterol Cal: 23 mg/dL (ref 5–40)

## 2016-11-19 LAB — HEPATIC FUNCTION PANEL
ALT: 37 IU/L — ABNORMAL HIGH (ref 0–32)
AST: 33 IU/L (ref 0–40)
Albumin: 4.7 g/dL (ref 3.6–4.8)
Alkaline Phosphatase: 69 IU/L (ref 39–117)
Bilirubin Total: 0.6 mg/dL (ref 0.0–1.2)
Bilirubin, Direct: 0.16 mg/dL (ref 0.00–0.40)
Total Protein: 8.4 g/dL (ref 6.0–8.5)

## 2016-11-19 LAB — HIV ANTIBODY (ROUTINE TESTING W REFLEX): HIV Screen 4th Generation wRfx: NONREACTIVE

## 2016-11-19 LAB — MICROALBUMIN / CREATININE URINE RATIO
Creatinine, Urine: 51.3 mg/dL
Microalb/Creat Ratio: 29 mg/g creat (ref 0.0–30.0)
Microalbumin, Urine: 14.9 ug/mL

## 2016-12-10 ENCOUNTER — Other Ambulatory Visit (INDEPENDENT_AMBULATORY_CARE_PROVIDER_SITE_OTHER): Payer: Self-pay

## 2016-12-10 DIAGNOSIS — Z1211 Encounter for screening for malignant neoplasm of colon: Secondary | ICD-10-CM

## 2016-12-10 LAB — POC HEMOCCULT BLD/STL (HOME/3-CARD/SCREEN)
Card #2 Fecal Occult Blod, POC: NEGATIVE
Card #3 Fecal Occult Blood, POC: NEGATIVE
Fecal Occult Blood, POC: NEGATIVE

## 2017-05-19 ENCOUNTER — Ambulatory Visit: Payer: Self-pay | Admitting: Internal Medicine

## 2017-05-19 ENCOUNTER — Encounter: Payer: Self-pay | Admitting: Internal Medicine

## 2017-05-19 VITALS — BP 128/82 | HR 82 | Resp 12 | Ht 60.0 in | Wt 130.0 lb

## 2017-05-19 DIAGNOSIS — E782 Mixed hyperlipidemia: Secondary | ICD-10-CM

## 2017-05-19 DIAGNOSIS — Z1231 Encounter for screening mammogram for malignant neoplasm of breast: Secondary | ICD-10-CM

## 2017-05-19 DIAGNOSIS — R748 Abnormal levels of other serum enzymes: Secondary | ICD-10-CM

## 2017-05-19 DIAGNOSIS — Z23 Encounter for immunization: Secondary | ICD-10-CM

## 2017-05-19 DIAGNOSIS — R0989 Other specified symptoms and signs involving the circulatory and respiratory systems: Secondary | ICD-10-CM | POA: Insufficient documentation

## 2017-05-19 DIAGNOSIS — Z1239 Encounter for other screening for malignant neoplasm of breast: Secondary | ICD-10-CM

## 2017-05-19 DIAGNOSIS — I10 Essential (primary) hypertension: Secondary | ICD-10-CM

## 2017-05-19 DIAGNOSIS — E119 Type 2 diabetes mellitus without complications: Secondary | ICD-10-CM

## 2017-05-19 HISTORY — DX: Other specified symptoms and signs involving the circulatory and respiratory systems: R09.89

## 2017-05-19 LAB — GLUCOSE, POCT (MANUAL RESULT ENTRY): POC Glucose: 142 mg/dl — AB (ref 70–99)

## 2017-05-19 NOTE — Progress Notes (Signed)
   Subjective:    Patient ID: Cindy Ross, female    DOB: July 05, 1950, 66 y.o.   MRN: [id number]  HPI   1.  Mildly elevated ALT back in June.  Not clear why she did not get in for repeat in September.    2.  DM:  Sugars generally running in 130 to 140 range.  She feels she is eating healthy and is physically active.   Continues on Metformin. Eye exam due in Feb. Not checking feet nightly.  No numbness or tingling/burning. Has just received her influenza vaccine for the season. Up to date with Pneumococcal and Tdap No microalbuminuria earlier in June of this year.  3.  Hypertension:  Taking Losartan/hctz regularly.  No problems.  4.  Dyslipidemia:  Taking Atorvastatin.  HDL was just a bit low in June at 42, but rest of panel at goal.  Lipid Panel     Component Value Date/Time   CHOL 131 11/18/2016 0936   TRIG 114 11/18/2016 0936   HDL 42 11/18/2016 0936   LDLCALC 66 11/18/2016 0936    5.  Left posterior base lung crackles:  She did have a CXR for this on September 4th.  CXR was normal.  This was done at [facility] and is in [place].  No cough or dyspnea.  Current Meds  Medication Sig  . aspirin EC 81 MG tablet Take 1 tablet (81 mg total) by mouth daily.  Marland Kitchen atorvastatin (LIPITOR) 20 MG tablet 1 tab by mouth with evening meal daily  . Blood Glucose Monitoring Suppl (AGAMATRIX PRESTO PRO METER) DEVI Check sugars twice daily  . cholecalciferol (VITAMIN D) 1000 units tablet Take 1,000 Units by mouth daily.  Marland Kitchen glucose blood (AGAMATRIX PRESTO TEST) test strip Twice daily glucose checks  . losartan-hydrochlorothiazide (HYZAAR) 50-12.5 MG tablet Take 1 tablet by mouth daily.  . metFORMIN (GLUCOPHAGE) 1000 MG tablet Take 1 tablet (1,000 mg total) by mouth 2 (two) times daily with a meal.  . Multiple Vitamins-Minerals (ONE-A-DAY WOMENS 50+ ADVANTAGE PO) Take 1 tablet by mouth daily at 12 noon.    No Known Allergies    Review of Systems     Objective:   Physical Exam   NAD HEENT:  PERRL, EOMI, throat without injection Neck:  Supple, No adenopathy, no thyromegaly Chest:  CTA, still with dry crackles at left posterior base. CV:  RRR without murmur or rub, carotid, radial and DP pulses normal and equal. Abd:  S, NT, No HSM or mass, + BS LE:  Feet healthy, no edema of ankles or feet.        Assessment & Plan:  1.  Mildly elevated ALT:  Hepatic profile in recheck today.  2.  DM:  Has been well controlled since restarted on meds in June of 2017.  A1C one more time this year, then move toward checking 1-2 times yearly if remains at goal. Eye check due in Feb. Discussed PCV 13 due in May when turns 67 Encouraged checking feet nightly.  3.  Essential Hypertension:  Controlled.  4.  Hyperlipidemia:  Recheck to day to see if lifestyle changes have her HDL up at goal.  5.  Left lung base crackles:  No findings on CXR.  Likely atelectasis, though even that is not seen on XR.  6.  HM:  Due for mammogram after Jan 11.  Follow up in 6 months.

## 2017-05-20 LAB — LIPID PANEL W/O CHOL/HDL RATIO
Cholesterol, Total: 147 mg/dL (ref 100–199)
HDL: 43 mg/dL (ref >39)
LDL Calculated: 82 mg/dL (ref 0–99)
Triglycerides: 112 mg/dL (ref 0–149)
VLDL Cholesterol Cal: 22 mg/dL (ref 5–40)

## 2017-05-20 LAB — HEPATIC FUNCTION PANEL
ALT: 25 IU/L (ref 0–32)
AST: 33 IU/L (ref 0–40)
Albumin: 4.5 g/dL (ref 3.6–4.8)
Alkaline Phosphatase: 67 IU/L (ref 39–117)
Bilirubin Total: 0.3 mg/dL (ref 0.0–1.2)
Bilirubin, Direct: 0.1 mg/dL (ref 0.00–0.40)
Total Protein: 7.7 g/dL (ref 6.0–8.5)

## 2017-05-20 LAB — HGB A1C W/O EAG: Hgb A1c MFr Bld: 6.1 % — ABNORMAL HIGH (ref 4.8–5.6)

## 2017-07-24 ENCOUNTER — Other Ambulatory Visit: Payer: Self-pay | Admitting: Internal Medicine

## 2017-07-24 DIAGNOSIS — Z1239 Encounter for other screening for malignant neoplasm of breast: Secondary | ICD-10-CM

## 2017-08-20 ENCOUNTER — Ambulatory Visit
Admission: RE | Admit: 2017-08-20 | Discharge: 2017-08-20 | Disposition: A | Discharge status: Home / Self Care | Payer: No Typology Code available for payment source | Source: Ambulatory Visit | Attending: Internal Medicine | Admitting: Internal Medicine

## 2017-08-20 DIAGNOSIS — Z1239 Encounter for other screening for malignant neoplasm of breast: Secondary | ICD-10-CM

## 2017-09-01 ENCOUNTER — Other Ambulatory Visit (HOSPITAL_COMMUNITY): Payer: Self-pay | Admitting: *Deleted

## 2017-09-01 DIAGNOSIS — R928 Other abnormal and inconclusive findings on diagnostic imaging of breast: Secondary | ICD-10-CM

## 2017-09-18 ENCOUNTER — Ambulatory Visit
Admission: RE | Admit: 2017-09-18 | Discharge: 2017-09-18 | Disposition: A | Discharge status: Home / Self Care | Payer: No Typology Code available for payment source | Source: Ambulatory Visit | Attending: Obstetrics and Gynecology | Admitting: Obstetrics and Gynecology

## 2017-09-18 ENCOUNTER — Ambulatory Visit (HOSPITAL_COMMUNITY)
Admission: RE | Admit: 2017-09-18 | Discharge: 2017-09-18 | Disposition: A | Discharge status: Home / Self Care | Payer: Self-pay | Source: Ambulatory Visit | Attending: Obstetrics and Gynecology | Admitting: Obstetrics and Gynecology

## 2017-09-18 ENCOUNTER — Encounter (HOSPITAL_COMMUNITY): Payer: Self-pay

## 2017-09-18 ENCOUNTER — Other Ambulatory Visit (HOSPITAL_COMMUNITY): Payer: Self-pay | Admitting: Obstetrics and Gynecology

## 2017-09-18 VITALS — BP 124/70 | Ht 61.0 in

## 2017-09-18 DIAGNOSIS — Z1239 Encounter for other screening for malignant neoplasm of breast: Secondary | ICD-10-CM

## 2017-09-18 DIAGNOSIS — R599 Enlarged lymph nodes, unspecified: Secondary | ICD-10-CM

## 2017-09-18 DIAGNOSIS — R928 Other abnormal and inconclusive findings on diagnostic imaging of breast: Secondary | ICD-10-CM

## 2017-09-18 NOTE — Progress Notes (Signed)
Patient referred to [facility] by the [place] due to additional imaging of the right breast is recommended. Screening mammogram completed 08/20/2017.  Pap Smear: Pap smear not completed today. Last Pap smear was 11/18/2016 at [facility] and normal. Per patient has no history of an abnormal Pap smear. Last Pap smear result is in Epic.  Physical exam: Breasts Breasts symmetrical. No skin abnormalities bilateral breasts. No nipple retraction bilateral breasts. No nipple discharge bilateral breasts. No lymphadenopathy. No lumps palpated bilateral breasts. No complaints of pain or tenderness on exam. Referred patient to the [facility] for a right breast diagnostic mammogram and possible right breast ultrasound per recommendation. Appointment scheduled for Thursday, September 18, 2017 at 1130.        Pelvic/Bimanual No Pap smear completed today since last Pap smear was 11/18/2016. Pap smear not indicated per BCCCP guidelines.   Smoking History: Patient has never smoked.  Patient Navigation: Patient education provided. Access to services provided for patient through [place] program. Patient's sister Elky Funches interpreted for patient. Patient's sister signed form to interpret for patient.  Colorectal Cancer Screening: Per patient has never had a colonoscopy completed. No complaints today. FIT Test completed 12/10/2016 that was normal.  Breast and Cervical Cancer Risk Assessment: Patient has no family history of breast cancer, known genetic mutations, or radiation treatment to the chest before age 49. Patient has no history of cervical dysplasia, immunocompromised, or DES exposure in-utero.  Used patients sister Janese Radabaugh to assist with interpreting [place].

## 2017-09-18 NOTE — Patient Instructions (Signed)
Explained breast self awareness with Maxcine N Amason. Patient did not need a Pap smear today due to last Pap smear was 11/18/2016. Let her know BCCCP will cover Pap smears every 3 years unless has a history of abnormal Pap smears. Referred patient to the [facility] for a right breast diagnostic mammogram and possible right breast ultrasound per recommendation. Appointment scheduled for Thursday, September 18, 2017 at 1130. Tinley N Ebrahimi verbalized understanding.  Krishav Mamone, Arvil Chaco, RN 12:01 PM

## 2017-09-19 ENCOUNTER — Encounter (HOSPITAL_COMMUNITY): Payer: Self-pay | Admitting: *Deleted

## 2017-09-29 ENCOUNTER — Ambulatory Visit
Admission: RE | Admit: 2017-09-29 | Discharge: 2017-09-29 | Disposition: A | Discharge status: Home / Self Care | Payer: No Typology Code available for payment source | Source: Ambulatory Visit | Attending: Obstetrics and Gynecology | Admitting: Obstetrics and Gynecology

## 2017-09-29 DIAGNOSIS — R599 Enlarged lymph nodes, unspecified: Secondary | ICD-10-CM

## 2017-09-30 ENCOUNTER — Other Ambulatory Visit: Payer: Self-pay | Admitting: Obstetrics and Gynecology

## 2017-09-30 DIAGNOSIS — R599 Enlarged lymph nodes, unspecified: Secondary | ICD-10-CM

## 2017-09-30 DIAGNOSIS — N63 Unspecified lump in unspecified breast: Secondary | ICD-10-CM

## 2017-10-03 ENCOUNTER — Inpatient Hospital Stay: Admission: RE | Admit: 2017-10-03 | Payer: No Typology Code available for payment source | Source: Ambulatory Visit

## 2017-10-09 ENCOUNTER — Ambulatory Visit
Admission: RE | Admit: 2017-10-09 | Discharge: 2017-10-09 | Disposition: A | Discharge status: Home / Self Care | Payer: No Typology Code available for payment source | Source: Ambulatory Visit | Attending: Obstetrics and Gynecology | Admitting: Obstetrics and Gynecology

## 2017-10-09 ENCOUNTER — Other Ambulatory Visit (HOSPITAL_COMMUNITY)
Admission: RE | Admit: 2017-10-09 | Discharge: 2017-10-09 | Disposition: A | Discharge status: Home / Self Care | Payer: No Typology Code available for payment source | Source: Ambulatory Visit | Attending: Diagnostic Radiology | Admitting: Diagnostic Radiology

## 2017-10-09 DIAGNOSIS — R599 Enlarged lymph nodes, unspecified: Secondary | ICD-10-CM

## 2017-10-13 ENCOUNTER — Other Ambulatory Visit: Payer: Self-pay | Admitting: Obstetrics and Gynecology

## 2017-10-13 DIAGNOSIS — Z09 Encounter for follow-up examination after completed treatment for conditions other than malignant neoplasm: Secondary | ICD-10-CM

## 2017-10-28 ENCOUNTER — Other Ambulatory Visit: Payer: Self-pay | Admitting: Obstetrics and Gynecology

## 2017-10-28 DIAGNOSIS — N6310 Unspecified lump in the right breast, unspecified quadrant: Secondary | ICD-10-CM

## 2017-11-17 ENCOUNTER — Ambulatory Visit: Payer: Medicare Other | Admitting: Internal Medicine

## 2017-11-17 DIAGNOSIS — E782 Mixed hyperlipidemia: Secondary | ICD-10-CM

## 2017-11-17 DIAGNOSIS — I10 Essential (primary) hypertension: Secondary | ICD-10-CM

## 2017-11-17 DIAGNOSIS — E118 Type 2 diabetes mellitus with unspecified complications: Secondary | ICD-10-CM

## 2017-11-17 MED ORDER — LOSARTAN POTASSIUM-HCTZ 50-12.5 MG PO TABS: 1.0000 | ORAL_TABLET | Freq: Every day | ORAL | 5 refills | Status: DC

## 2017-11-17 MED ORDER — ATORVASTATIN CALCIUM 20 MG PO TABS: ORAL_TABLET | ORAL | 5 refills | Status: DC

## 2017-11-17 MED ORDER — METFORMIN HCL 1000 MG PO TABS: 1000.0000 mg | ORAL_TABLET | Freq: Two times a day (BID) | ORAL | 5 refills | Status: DC

## 2017-11-17 NOTE — Progress Notes (Signed)
Patient now with Medicare with [employer] with which we are not credentialed. Discussed we could see her for our flat fee for uninsured, but she states she cannot afford.  She will try to enroll with another Medicare entity when open enrollment occurs again.  Discussed [facility] for in the future to sign up for an appropriate Medicare.  She states she needs med refills. Sending her Losartan/HCTZ Metformin, Atorvastatin to [facility] on [address] and [address]

## 2017-12-02 DIAGNOSIS — R05 Cough: Secondary | ICD-10-CM | POA: Diagnosis not present

## 2017-12-02 DIAGNOSIS — E119 Type 2 diabetes mellitus without complications: Secondary | ICD-10-CM | POA: Diagnosis not present

## 2017-12-02 DIAGNOSIS — E559 Vitamin D deficiency, unspecified: Secondary | ICD-10-CM | POA: Diagnosis not present

## 2017-12-02 DIAGNOSIS — I10 Essential (primary) hypertension: Secondary | ICD-10-CM | POA: Diagnosis not present

## 2017-12-02 DIAGNOSIS — Z7689 Persons encountering health services in other specified circumstances: Secondary | ICD-10-CM | POA: Diagnosis not present

## 2017-12-09 DIAGNOSIS — H25012 Cortical age-related cataract, left eye: Secondary | ICD-10-CM | POA: Diagnosis not present

## 2017-12-09 DIAGNOSIS — H35033 Hypertensive retinopathy, bilateral: Secondary | ICD-10-CM | POA: Diagnosis not present

## 2017-12-09 DIAGNOSIS — H25011 Cortical age-related cataract, right eye: Secondary | ICD-10-CM | POA: Diagnosis not present

## 2017-12-09 DIAGNOSIS — E119 Type 2 diabetes mellitus without complications: Secondary | ICD-10-CM | POA: Diagnosis not present

## 2017-12-09 LAB — HM DIABETES EYE EXAM

## 2017-12-17 DIAGNOSIS — Z1212 Encounter for screening for malignant neoplasm of rectum: Secondary | ICD-10-CM | POA: Diagnosis not present

## 2017-12-17 DIAGNOSIS — E119 Type 2 diabetes mellitus without complications: Secondary | ICD-10-CM | POA: Diagnosis not present

## 2017-12-17 DIAGNOSIS — Z124 Encounter for screening for malignant neoplasm of cervix: Secondary | ICD-10-CM | POA: Diagnosis not present

## 2017-12-17 DIAGNOSIS — Z0000 Encounter for general adult medical examination without abnormal findings: Secondary | ICD-10-CM | POA: Diagnosis not present

## 2017-12-17 DIAGNOSIS — Z1211 Encounter for screening for malignant neoplasm of colon: Secondary | ICD-10-CM | POA: Diagnosis not present

## 2018-01-06 DIAGNOSIS — H5213 Myopia, bilateral: Secondary | ICD-10-CM | POA: Diagnosis not present

## 2018-01-08 DIAGNOSIS — Z1211 Encounter for screening for malignant neoplasm of colon: Secondary | ICD-10-CM | POA: Diagnosis not present

## 2018-01-08 DIAGNOSIS — K641 Second degree hemorrhoids: Secondary | ICD-10-CM | POA: Diagnosis not present

## 2018-02-23 DIAGNOSIS — K635 Polyp of colon: Secondary | ICD-10-CM | POA: Diagnosis not present

## 2018-02-23 DIAGNOSIS — D120 Benign neoplasm of cecum: Secondary | ICD-10-CM | POA: Diagnosis not present

## 2018-02-23 DIAGNOSIS — D125 Benign neoplasm of sigmoid colon: Secondary | ICD-10-CM | POA: Diagnosis not present

## 2018-02-23 DIAGNOSIS — Z1211 Encounter for screening for malignant neoplasm of colon: Secondary | ICD-10-CM | POA: Diagnosis not present

## 2018-02-23 DIAGNOSIS — D124 Benign neoplasm of descending colon: Secondary | ICD-10-CM | POA: Diagnosis not present

## 2018-02-23 LAB — HM COLONOSCOPY

## 2018-03-05 LAB — HM COLONOSCOPY

## 2018-04-13 ENCOUNTER — Ambulatory Visit
Admission: RE | Admit: 2018-04-13 | Discharge: 2018-04-13 | Disposition: A | Discharge status: Home / Self Care | Payer: No Typology Code available for payment source | Source: Ambulatory Visit | Attending: Obstetrics and Gynecology | Admitting: Obstetrics and Gynecology

## 2018-04-13 ENCOUNTER — Ambulatory Visit
Admission: RE | Admit: 2018-04-13 | Discharge: 2018-04-13 | Disposition: A | Discharge status: Home / Self Care | Payer: Medicare Other | Source: Ambulatory Visit | Attending: Obstetrics and Gynecology | Admitting: Obstetrics and Gynecology

## 2018-04-13 DIAGNOSIS — N6310 Unspecified lump in the right breast, unspecified quadrant: Secondary | ICD-10-CM

## 2018-04-13 DIAGNOSIS — N6489 Other specified disorders of breast: Secondary | ICD-10-CM | POA: Diagnosis not present

## 2018-04-13 DIAGNOSIS — R922 Inconclusive mammogram: Secondary | ICD-10-CM | POA: Diagnosis not present

## 2018-04-13 DIAGNOSIS — Z09 Encounter for follow-up examination after completed treatment for conditions other than malignant neoplasm: Secondary | ICD-10-CM

## 2018-04-17 ENCOUNTER — Other Ambulatory Visit: Payer: Self-pay | Admitting: Internal Medicine

## 2018-04-17 DIAGNOSIS — E782 Mixed hyperlipidemia: Secondary | ICD-10-CM

## 2018-04-17 DIAGNOSIS — E118 Type 2 diabetes mellitus with unspecified complications: Secondary | ICD-10-CM

## 2018-05-17 ENCOUNTER — Other Ambulatory Visit: Payer: Self-pay | Admitting: Internal Medicine

## 2018-06-02 ENCOUNTER — Other Ambulatory Visit: Payer: Self-pay

## 2018-06-02 ENCOUNTER — Ambulatory Visit (INDEPENDENT_AMBULATORY_CARE_PROVIDER_SITE_OTHER): Payer: Medicare Other | Admitting: Internal Medicine

## 2018-06-02 ENCOUNTER — Encounter: Payer: Self-pay | Admitting: Internal Medicine

## 2018-06-02 VITALS — BP 130/70 | HR 79 | Temp 97.8°F | Ht 60.0 in | Wt 131.6 lb

## 2018-06-02 DIAGNOSIS — E1165 Type 2 diabetes mellitus with hyperglycemia: Secondary | ICD-10-CM | POA: Diagnosis not present

## 2018-06-02 DIAGNOSIS — I10 Essential (primary) hypertension: Secondary | ICD-10-CM

## 2018-06-02 DIAGNOSIS — E782 Mixed hyperlipidemia: Secondary | ICD-10-CM

## 2018-06-02 DIAGNOSIS — E118 Type 2 diabetes mellitus with unspecified complications: Secondary | ICD-10-CM

## 2018-06-02 DIAGNOSIS — Z0000 Encounter for general adult medical examination without abnormal findings: Secondary | ICD-10-CM

## 2018-06-02 LAB — CBC
Hematocrit: 32.4 % — ABNORMAL LOW (ref 34.0–46.6)
Hemoglobin: 10.9 g/dL — ABNORMAL LOW (ref 11.1–15.9)
MCH: 30.6 pg (ref 26.6–33.0)
MCHC: 33.6 g/dL (ref 31.5–35.7)
MCV: 91 fL (ref 79–97)
Platelets: 223 10*3/uL (ref 150–450)
RBC: 3.56 x10E6/uL — ABNORMAL LOW (ref 3.77–5.28)
RDW: 12.9 % (ref 12.3–15.4)
WBC: 5.6 10*3/uL (ref 3.4–10.8)

## 2018-06-02 LAB — POCT URINALYSIS DIPSTICK
Bilirubin, UA: NEGATIVE
Glucose, UA: NEGATIVE
Ketones, UA: NEGATIVE
Nitrite, UA: NEGATIVE
Protein, UA: NEGATIVE
Spec Grav, UA: 1.01 (ref 1.010–1.025)
Urobilinogen, UA: 0.2 E.U./dL
pH, UA: 5.5 (ref 5.0–8.0)

## 2018-06-02 LAB — CMP14 + ANION GAP
ALT: 31 IU/L (ref 0–32)
AST: 25 IU/L (ref 0–40)
Albumin/Globulin Ratio: 1.4 (ref 1.2–2.2)
Albumin: 4.7 g/dL (ref 3.6–4.8)
Alkaline Phosphatase: 73 IU/L (ref 39–117)
Anion Gap: 15 mmol/L (ref 10.0–18.0)
BUN/Creatinine Ratio: 24 (ref 12–28)
BUN: 21 mg/dL (ref 8–27)
Bilirubin Total: 0.6 mg/dL (ref 0.0–1.2)
CO2: 23 mmol/L (ref 20–29)
Calcium: 10 mg/dL (ref 8.7–10.3)
Chloride: 102 mmol/L (ref 96–106)
Creatinine, Ser: 0.86 mg/dL (ref 0.57–1.00)
GFR calc Af Amer: 82 mL/min/{1.73_m2} (ref >59)
GFR calc non Af Amer: 71 mL/min/{1.73_m2} (ref >59)
Globulin, Total: 3.3 g/dL (ref 1.5–4.5)
Glucose: 114 mg/dL — ABNORMAL HIGH (ref 65–99)
Potassium: 3.9 mmol/L (ref 3.5–5.2)
Sodium: 140 mmol/L (ref 134–144)
Total Protein: 8 g/dL (ref 6.0–8.5)

## 2018-06-02 LAB — POCT UA - MICROALBUMIN
Albumin/Creatinine Ratio, Urine, POC: 30
Creatinine, POC: 300 mg/dL
Microalbumin Ur, POC: 10 mg/L

## 2018-06-02 LAB — HEMOGLOBIN A1C
Est. average glucose Bld gHb Est-mCnc: 134 mg/dL
Hgb A1c MFr Bld: 6.3 % — ABNORMAL HIGH (ref 4.8–5.6)

## 2018-06-02 MED ORDER — METFORMIN HCL 1000 MG PO TABS: 1000.0000 mg | ORAL_TABLET | Freq: Two times a day (BID) | ORAL | 0 refills | Status: DC

## 2018-06-02 MED ORDER — ATORVASTATIN CALCIUM 20 MG PO TABS: ORAL_TABLET | ORAL | 1 refills | Status: DC

## 2018-06-02 MED ORDER — LOSARTAN POTASSIUM-HCTZ 50-12.5 MG PO TABS: 1.0000 | ORAL_TABLET | Freq: Every day | ORAL | 0 refills | Status: DC

## 2018-06-02 NOTE — Progress Notes (Signed)
Subjective:     Patient ID: Cindy Ross , female    DOB: 16-Jan-1951 , 67 y.o.   MRN: [id number]   Chief Complaint  Patient presents with  . Medicare Wellness    HPI Pt is here for Welcome to Medicare Visit. She has a Optometrist with her today.    Past Medical History:  Diagnosis Date  . Diabetes mellitus without complication ([place])   . Hyperlipidemia 01/30/2016  . Hypertension 2007  . Respiratory crackles at left lung base 05/19/2017   CXR 02/2017 in [facility] normal     Family History  Problem Relation Age of Onset  . Hyperlipidemia Sister   . Diabetes Sister   . Hypertension Sister   . Heart disease Brother   . Liver disease Brother      Current Outpatient Medications:  .  aspirin EC 81 MG tablet, Take 1 tablet (81 mg total) by mouth daily., Disp: , Rfl:  .  Blood Glucose Monitoring Suppl (AGAMATRIX PRESTO PRO METER) DEVI, Check sugars twice daily, Disp: 1 Device, Rfl: 0 .  cholecalciferol (VITAMIN D) 1000 units tablet, Take 1,000 Units by mouth daily., Disp: , Rfl:  .  glucose blood (AGAMATRIX PRESTO TEST) test strip, Twice daily glucose checks, Disp: 100 each, Rfl: 11 .  Multiple Vitamins-Minerals (ONE-A-DAY WOMENS 50+ ADVANTAGE PO), Take 1 tablet by mouth daily at 12 noon., Disp: , Rfl:  .  atorvastatin (LIPITOR) 20 MG tablet, TAKE 1 TABLET BY MOUTH  EVERY DAY, Disp: 90 tablet, Rfl: 1 .  losartan-hydrochlorothiazide (HYZAAR) 50-12.5 MG tablet, Take 1 tablet by mouth daily., Disp: 90 tablet, Rfl: 0 .  metFORMIN (GLUCOPHAGE) 1000 MG tablet, Take 1 tablet (1,000 mg total) by mouth 2 (two) times daily., Disp: 180 tablet, Rfl: 0   No Known Allergies   Review of Systems  Constitutional: Negative.   HENT: Negative.   Eyes: Negative.   Respiratory: Negative.   Cardiovascular: Negative.   Gastrointestinal: Negative.   Endocrine: Negative.   Genitourinary: Negative.   Musculoskeletal: Negative.   Skin: Negative.   Allergic/Immunologic: Negative.     Neurological: Negative.   Hematological: Negative.   Psychiatric/Behavioral: Negative.      Today's Vitals   06/02/18 1000  BP: 130/70  Pulse: 79  Temp: 97.8 F (36.6 C)  TempSrc: Oral  SpO2: 96%  Weight: 131 lb 9.6 oz (59.7 kg)  Height: 5' (1.524 m)   Body mass index is 25.7 kg/m.   Objective:  Physical Exam Cardiovascular:     Rate and Rhythm: Normal rate and regular rhythm.     Pulses: Normal pulses.     Heart sounds: No murmur.  Pulmonary:     Effort: Pulmonary effort is normal.     Breath sounds: Normal breath sounds. No stridor. No rhonchi or rales.  Neurological:     Mental Status: She is alert.  Psychiatric:        Mood and Affect: Mood normal.        Behavior: Behavior normal.     Assessment And Plan:     1. Uncontrolled type 2 diabetes mellitus with hyperglycemia (HCC)-chronic FU 3 MONTHS - POCT Urinalysis Dipstick (81002) - POCT UA - Microalbumin - Hemoglobin A1c  2. Essential hypertension- chronic and stable. May continue same meds. Fu 3 MONTHS. - POCT Urinalysis Dipstick (81002) - POCT UA - Microalbumin - CBC no Diff - CMP14 + Anion Gap 3- Welcome to Medicare visit. Normal. Fu Affton, PA-C  Objective:    Today's Vitals   06/02/18 1000  BP: 130/70  Pulse: 79  Temp: 97.8 F (36.6 C)  TempSrc: Oral  SpO2: 96%  Weight: 131 lb 9.6 oz (59.7 kg)  Height: 5' (1.524 m)  Body mass index is 25.7 kg/m.  Medications Outpatient Encounter Medications as of 06/02/2018  Medication Sig  . aspirin EC 81 MG tablet Take 1 tablet (81 mg total) by mouth daily.  . Blood Glucose Monitoring Suppl (AGAMATRIX PRESTO PRO METER) DEVI Check sugars twice daily  . cholecalciferol (VITAMIN D) 1000 units tablet Take 1,000 Units by mouth daily.  Marland Kitchen glucose blood (AGAMATRIX PRESTO TEST) test strip Twice daily glucose checks  . Multiple Vitamins-Minerals (ONE-A-DAY WOMENS 50+ ADVANTAGE PO) Take 1 tablet by mouth daily at 12 noon.   . [DISCONTINUED] atorvastatin (LIPITOR) 20 MG tablet TAKE 1 TABLET BY MOUTH  EVERY DAY  . [DISCONTINUED] losartan-hydrochlorothiazide (HYZAAR) 50-12.5 MG tablet Take 1 tablet by mouth daily.  . [DISCONTINUED] metFORMIN (GLUCOPHAGE) 1000 MG tablet TAKE 1 TABLET BY MOUTH  TWICE A DAY  . [DISCONTINUED] olmesartan-hydrochlorothiazide (BENICAR HCT) 40-12.5 MG tablet TAKE 1 TABLET BY MOUTH EVERY DAY   No facility-administered encounter medications on file as of 06/02/2018.      History: Past Medical History:  Diagnosis Date  . Diabetes mellitus without complication ([place])   . Hyperlipidemia 01/30/2016  . Hypertension 2007  . Respiratory crackles at left lung base 05/19/2017   CXR 02/2017 in [facility] normal   History reviewed. No pertinent surgical history.  Family History  Problem Relation Age of Onset  . Hyperlipidemia Sister   . Diabetes Sister   . Hypertension Sister   . Heart disease Brother   . Liver disease Brother    Social History   Occupational History  . 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 Smoking status: Never Smoker  . Smokeless tobacco: Never Used  Substance and Sexual Activity  . Alcohol use: No    Alcohol/week: 0.0 standard drinks  . Drug use: No  . Sexual activity: Never    Tobacco Counseling Does not smoke  Immunizations and Health Maintenance Immunization History  Administered Date(s) Administered  . Influenza Inj Mdck Quad Pf 07/31/2016, 05/19/2017  . Influenza-Unspecified 04/17/2018  . Pneumococcal Polysaccharide-23 03/19/2016  . Td 10/31/1994, 04/10/1995  . Tdap 01/30/2016   Health Maintenance Due  Topic Date Due  . COLONOSCOPY  11/06/2002  . PAP SMEAR-Modifier  06/17/2017  . OPHTHALMOLOGY EXAM  07/18/2017  . DEXA SCAN  11/05/2017  . PNA vac Low Risk Adult (1 of 2 - PCV13) 11/05/2017  . HEMOGLOBIN A1C  11/17/2017  . INFLUENZA VACCINE  01/15/2018    Activities of Daily Living In  your present state of health, do you have any difficulty performing the following activities: 06/02/2018  Hearing? N  Vision? N  Difficulty concentrating or making decisions? N  Walking or climbing stairs? N  Dressing or bathing? N  Doing errands, shopping? N  Some recent data might be hidden   Advanced Directives: unknown      Assessment:      Vision/Hearing screen  Visual Acuity Screening   Right eye Left eye Both eyes  Without correction:     With correction: 20/30 20/30 20/30    HEARING- NORMAL WHISPER TEST AT 5 FT BILATERALLY Dietary issues and exercise activities discussed:  Current Exercise Habits: Structured exercise class;Home exercise routine, Type of exercise: walking, Time (Minutes): 60,  Frequency (Times/Week): 7, Weekly Exercise (Minutes/Week): 420, Intensity: Mild  Goals   None    Depression Screen PHQ 2/9 Scores 06/02/2018 11/28/2015  PHQ - 2 Score 0 0  PHQ- 9 Score - 0     Fall Risk Fall Risk  06/02/2018  Falls in the past year? 0    Cognitive Function: MMSE - Mini Mental State Exam 06/02/2018  Orientation to time 5  Orientation to Place 5  Attention/ Calculation 5  Language- name 2 objects 2  Language- repeat 1  Language- read & follow direction 1  Write a sentence 1  Copy design 1     6CIT Screen 06/02/2018  What Year? 0 points  What month? 0 points  What time? 0 points  Count back from 20 0 points  Months in reverse 0 points  Repeat phrase 0 points  Total Score 0    Patient Care Team: Glendale Chard, MD as PCP - General (Internal Medicine)         Shelby Mattocks, PA-C 06/02/2018

## 2018-06-02 NOTE — Patient Instructions (Signed)
  Cindy Ross , Thank you for taking time to come for your Medicare Wellness Visit. I appreciate your ongoing commitment to your health goals. Please review the following plan we discussed and let me know if I can assist you in the future.   These are the goals we discussed: Goals   None     This is a list of the screening recommended for you and due dates:  Health Maintenance  Topic Date Due  . Colon Cancer Screening  11/06/2002  . Pap Smear  06/17/2017  . Eye exam for diabetics  07/18/2017  . DEXA scan (bone density measurement)  11/05/2017  . Pneumonia vaccines (1 of 2 - PCV13) 11/05/2017  . Hemoglobin A1C  11/17/2017  . Flu Shot  01/15/2018  . Complete foot exam   06/03/2019  . Mammogram  08/21/2019  . Tetanus Vaccine  01/29/2026  .  Hepatitis C: One time screening is recommended by Center for Disease Control  (CDC) for  adults born from [birth year] through 1963.   Completed  . HIV Screening  Completed

## 2018-06-04 ENCOUNTER — Other Ambulatory Visit: Payer: Self-pay | Admitting: Internal Medicine

## 2018-06-04 DIAGNOSIS — D649 Anemia, unspecified: Secondary | ICD-10-CM

## 2018-06-23 ENCOUNTER — Ambulatory Visit: Payer: Self-pay | Admitting: Internal Medicine

## 2018-07-07 ENCOUNTER — Other Ambulatory Visit (HOSPITAL_COMMUNITY): Payer: Self-pay | Admitting: Internal Medicine

## 2018-07-07 DIAGNOSIS — R7989 Other specified abnormal findings of blood chemistry: Secondary | ICD-10-CM

## 2018-07-07 NOTE — Progress Notes (Signed)
Repeat Ferritin levels ordered for Feb.

## 2018-07-14 ENCOUNTER — Telehealth: Payer: Self-pay

## 2018-07-14 DIAGNOSIS — E782 Mixed hyperlipidemia: Secondary | ICD-10-CM

## 2018-07-14 MED ORDER — ATORVASTATIN CALCIUM 20 MG PO TABS: ORAL_TABLET | ORAL | 1 refills | Status: DC

## 2018-07-14 NOTE — Telephone Encounter (Signed)
Pt's sister Holli Rengel notified that the pt's atorvastatin refill faxed to the pharmacy.

## 2018-07-18 LAB — IRON AND TIBC
Iron Saturation: 31 % (ref 15–55)
Iron: 83 ug/dL (ref 27–139)
Total Iron Binding Capacity: 269 ug/dL (ref 250–450)
UIBC: 186 ug/dL (ref 118–369)

## 2018-07-18 LAB — SPECIMEN STATUS REPORT

## 2018-07-18 LAB — FERRITIN: Ferritin: 281 ng/mL — ABNORMAL HIGH (ref 15–150)

## 2018-07-22 ENCOUNTER — Encounter (HOSPITAL_COMMUNITY): Payer: Self-pay | Admitting: Internal Medicine

## 2018-07-30 ENCOUNTER — Other Ambulatory Visit: Payer: Self-pay | Admitting: Internal Medicine

## 2018-07-30 DIAGNOSIS — Z1231 Encounter for screening mammogram for malignant neoplasm of breast: Secondary | ICD-10-CM

## 2018-08-28 ENCOUNTER — Ambulatory Visit
Admission: RE | Admit: 2018-08-28 | Discharge: 2018-08-28 | Disposition: A | Discharge status: Home / Self Care | Payer: Medicare Other | Source: Ambulatory Visit | Attending: Internal Medicine | Admitting: Internal Medicine

## 2018-08-28 ENCOUNTER — Other Ambulatory Visit: Payer: Self-pay

## 2018-08-28 ENCOUNTER — Other Ambulatory Visit: Payer: Self-pay | Admitting: Internal Medicine

## 2018-08-28 DIAGNOSIS — Z1231 Encounter for screening mammogram for malignant neoplasm of breast: Secondary | ICD-10-CM

## 2018-09-08 ENCOUNTER — Other Ambulatory Visit: Payer: Self-pay

## 2018-09-08 ENCOUNTER — Ambulatory Visit (INDEPENDENT_AMBULATORY_CARE_PROVIDER_SITE_OTHER): Payer: Medicare Other | Admitting: Internal Medicine

## 2018-09-08 ENCOUNTER — Encounter: Payer: Self-pay | Admitting: Internal Medicine

## 2018-09-08 VITALS — BP 130/68 | HR 79 | Temp 97.7°F | Ht 59.6 in | Wt 131.0 lb

## 2018-09-08 DIAGNOSIS — I10 Essential (primary) hypertension: Secondary | ICD-10-CM | POA: Diagnosis not present

## 2018-09-08 DIAGNOSIS — E118 Type 2 diabetes mellitus with unspecified complications: Secondary | ICD-10-CM | POA: Diagnosis not present

## 2018-09-08 DIAGNOSIS — E7800 Pure hypercholesterolemia, unspecified: Secondary | ICD-10-CM | POA: Diagnosis not present

## 2018-09-08 DIAGNOSIS — R7989 Other specified abnormal findings of blood chemistry: Secondary | ICD-10-CM

## 2018-09-08 NOTE — Progress Notes (Signed)
Subjective:     Patient ID: Cindy Ross , female    DOB: 06/14/1951 , 68 y.o.   MRN: [id number]   Chief Complaint  Patient presents with  . Hyperlipidemia  . Diabetes    HPI  Pt is here for DM and HTN, states she has been doing well. The interpretor did not show up, so her son in law translated for me. Pt does not have any complaints and has been doing well. I asked her if she rascals being told about Her ferritin being elevated and she said yes. She denies bone pains, fatigue, weakness or abdominal pain. I gave her son in law the lab note from January with foods that can cause elevated ferritin and the only thing from it was greens which they add to soup. I told him not having in excess and 1 cup with her soup was fine. She also wanted to know about her pneumonia shot, and per old chart, looked like she needed to have Prevnar 13 which I told her was sent to her pharmacy when she had her Medicare visit, but she never went to get it.  Past Medical History:  Diagnosis Date  . Diabetes mellitus without complication ([place])   . Hyperlipidemia 01/30/2016  . Hypertension 2007  . Respiratory crackles at left lung base 05/19/2017   CXR 02/2017 in [facility] normal     Family History  Problem Relation Age of Onset  . Hyperlipidemia Sister   . Diabetes Sister   . Hypertension Sister   . Heart disease Brother   . Liver disease Brother      Current Outpatient Medications:  .  aspirin EC 81 MG tablet, Take 1 tablet (81 mg total) by mouth daily., Disp: , Rfl:  .  atorvastatin (LIPITOR) 20 MG tablet, TAKE 1 TABLET BY MOUTH  EVERY DAY, Disp: 90 tablet, Rfl: 1 .  Blood Glucose Monitoring Suppl (AGAMATRIX PRESTO PRO METER) DEVI, Check sugars twice daily, Disp: 1 Device, Rfl: 0 .  cholecalciferol (VITAMIN D) 1000 units tablet, Take 1,000 Units by mouth daily., Disp: , Rfl:  .  glucose blood (AGAMATRIX PRESTO TEST) test strip, Twice daily glucose checks, Disp: 100 each, Rfl: 11 .  metFORMIN  (GLUCOPHAGE) 1000 MG tablet, Take 1 tablet (1,000 mg total) by mouth 2 (two) times daily., Disp: 180 tablet, Rfl: 0 .  Multiple Vitamins-Minerals (ONE-A-DAY WOMENS 50+ ADVANTAGE PO), Take 1 tablet by mouth daily at 12 noon., Disp: , Rfl:  .  losartan-hydrochlorothiazide (HYZAAR) 50-12.5 MG tablet, Take 1 tablet by mouth daily., Disp: 90 tablet, Rfl: 0 .  olmesartan-hydrochlorothiazide (BENICAR HCT) 40-12.5 MG tablet, TAKE 1 TABLET BY MOUTH EVERY DAY, Disp: 90 tablet, Rfl: 0   No Known Allergies   Review of Systems  Review of Systems  Constitutional: Negative for diaphoresis and unexpected weight change.  HENT: Negative for tinnitus.   Eyes: Negative for visual disturbance.  Respiratory: Negative for chest tightness and shortness of breath.   Cardiovascular: Negative for chest pain, palpitations and leg swelling.  Gastrointestinal: Negative for constipation, diarrhea and nausea.  Endocrine: Negative for polydipsia, polyphagia and polyuria.  Genitourinary: Negative for dysuria and frequency.  Skin: Negative for rash and wound.  Neurological: Negative for dizziness, speech difficulty, weakness, numbness and headaches.  Today's Vitals   09/08/18 0857  BP: 130/68  Pulse: 79  Temp: 97.7 F (36.5 C)  TempSrc: Oral  SpO2: 97%  Weight: 131 lb (59.4 kg)  Height: 4' 11.6" (1.514 m)  Body mass index is 25.93 kg/m.   Objective:  Physical Exam   Constitutional: She is oriented to person, place, and time. She appears well-developed and well-nourished. No distress.  HENT:  Head: Normocephalic and atraumatic.  Right Ear: External ear normal.  Left Ear: External ear normal.  Nose: Nose normal.  Eyes: Conjunctivae are normal. Right eye exhibits no discharge. Left eye exhibits no discharge. No scleral icterus.  Neck: Neck supple. No thyromegaly present.  No carotid bruits bilaterally  Cardiovascular: Normal rate and regular rhythm.  No murmur heard. Pulmonary/Chest: Effort normal and  breath sounds normal. No respiratory distress.  Musculoskeletal: Normal range of motion. She exhibits no edema.  Lymphadenopathy:    She has no cervical adenopathy.  Neurological: She is alert and oriented to person, place, and time.  Skin: Skin is warm and dry. Capillary refill takes less than 2 seconds. No rash noted. She is not diaphoretic.  Psychiatric: She has a normal mood and affect. Her behavior is normal. Judgment and thought content normal.  Nursing note reviewed.  Assessment And Plan:    1. Essential hypertension-stable - CMP14 + Anion Gap - CBC no Diff  2. Controlled type 2 diabetes mellitus with complication, without long-term current use of insulin ([place])- stable - Hemoglobin A1c  3. Elevated ferritin- chronic - Ferritin - Iron and TIBC([facility])  4. Pure hypercholesterolemia- chronic - Lipid Profile  She will continue current meds. We will call her with results when they are back. She wants [place] to call the Mobile number and talk with her sister [facility], PA-C

## 2018-09-09 ENCOUNTER — Encounter: Payer: Self-pay | Admitting: Internal Medicine

## 2018-09-09 LAB — CMP14 + ANION GAP
ALT: 28 IU/L (ref 0–32)
AST: 25 IU/L (ref 0–40)
Albumin/Globulin Ratio: 1.2 (ref 1.2–2.2)
Albumin: 4.4 g/dL (ref 3.8–4.8)
Alkaline Phosphatase: 99 IU/L (ref 39–117)
Anion Gap: 15 mmol/L (ref 10.0–18.0)
BUN/Creatinine Ratio: 24 (ref 12–28)
BUN: 21 mg/dL (ref 8–27)
Bilirubin Total: <0.2 mg/dL (ref 0.0–1.2)
CO2: 24 mmol/L (ref 20–29)
Calcium: 10.3 mg/dL (ref 8.7–10.3)
Chloride: 106 mmol/L (ref 96–106)
Creatinine, Ser: 0.88 mg/dL (ref 0.57–1.00)
GFR calc Af Amer: 80 mL/min/{1.73_m2} (ref >59)
GFR calc non Af Amer: 69 mL/min/{1.73_m2} (ref >59)
Globulin, Total: 3.7 g/dL (ref 1.5–4.5)
Glucose: 115 mg/dL — ABNORMAL HIGH (ref 65–99)
Potassium: 4.9 mmol/L (ref 3.5–5.2)
Sodium: 145 mmol/L — ABNORMAL HIGH (ref 134–144)
Total Protein: 8.1 g/dL (ref 6.0–8.5)

## 2018-09-09 LAB — CBC
Hematocrit: 32.7 % — ABNORMAL LOW (ref 34.0–46.6)
Hemoglobin: 11 g/dL — ABNORMAL LOW (ref 11.1–15.9)
MCH: 30.8 pg (ref 26.6–33.0)
MCHC: 33.6 g/dL (ref 31.5–35.7)
MCV: 92 fL (ref 79–97)
Platelets: 239 10*3/uL (ref 150–450)
RBC: 3.57 x10E6/uL — ABNORMAL LOW (ref 3.77–5.28)
RDW: 11.8 % (ref 11.7–15.4)
WBC: 5 10*3/uL (ref 3.4–10.8)

## 2018-09-09 LAB — IRON AND TIBC
Iron Saturation: 21 % (ref 15–55)
Iron: 52 ug/dL (ref 27–139)
Total Iron Binding Capacity: 253 ug/dL (ref 250–450)
UIBC: 201 ug/dL (ref 118–369)

## 2018-09-09 LAB — LIPID PANEL
Chol/HDL Ratio: 3.2 ratio (ref 0.0–4.4)
Cholesterol, Total: 125 mg/dL (ref 100–199)
HDL: 39 mg/dL — ABNORMAL LOW (ref >39)
LDL Calculated: 64 mg/dL (ref 0–99)
Triglycerides: 112 mg/dL (ref 0–149)
VLDL Cholesterol Cal: 22 mg/dL (ref 5–40)

## 2018-09-09 LAB — HEMOGLOBIN A1C
Est. average glucose Bld gHb Est-mCnc: 128 mg/dL
Hgb A1c MFr Bld: 6.1 % — ABNORMAL HIGH (ref 4.8–5.6)

## 2018-09-09 LAB — FERRITIN: Ferritin: 257 ng/mL — ABNORMAL HIGH (ref 15–150)

## 2018-09-09 NOTE — Progress Notes (Signed)
Letter sent of her results.

## 2018-09-18 ENCOUNTER — Other Ambulatory Visit: Payer: Self-pay | Admitting: Internal Medicine

## 2018-09-18 DIAGNOSIS — E118 Type 2 diabetes mellitus with unspecified complications: Secondary | ICD-10-CM

## 2018-11-18 ENCOUNTER — Other Ambulatory Visit: Payer: Self-pay | Admitting: Internal Medicine

## 2018-12-10 ENCOUNTER — Other Ambulatory Visit: Payer: Self-pay | Admitting: Internal Medicine

## 2018-12-10 DIAGNOSIS — E782 Mixed hyperlipidemia: Secondary | ICD-10-CM

## 2018-12-15 ENCOUNTER — Other Ambulatory Visit: Payer: Self-pay

## 2018-12-15 ENCOUNTER — Ambulatory Visit (INDEPENDENT_AMBULATORY_CARE_PROVIDER_SITE_OTHER): Payer: Medicare Other | Admitting: Internal Medicine

## 2018-12-15 ENCOUNTER — Encounter: Payer: Self-pay | Admitting: Internal Medicine

## 2018-12-15 ENCOUNTER — Ambulatory Visit (INDEPENDENT_AMBULATORY_CARE_PROVIDER_SITE_OTHER): Payer: Medicare Other

## 2018-12-15 VITALS — BP 138/78 | HR 78 | Temp 98.6°F | Ht 59.6 in | Wt 129.4 lb

## 2018-12-15 DIAGNOSIS — Z0000 Encounter for general adult medical examination without abnormal findings: Secondary | ICD-10-CM | POA: Diagnosis not present

## 2018-12-15 DIAGNOSIS — E2839 Other primary ovarian failure: Secondary | ICD-10-CM

## 2018-12-15 DIAGNOSIS — E119 Type 2 diabetes mellitus without complications: Secondary | ICD-10-CM

## 2018-12-15 DIAGNOSIS — Z23 Encounter for immunization: Secondary | ICD-10-CM

## 2018-12-15 DIAGNOSIS — E7800 Pure hypercholesterolemia, unspecified: Secondary | ICD-10-CM

## 2018-12-15 DIAGNOSIS — E118 Type 2 diabetes mellitus with unspecified complications: Secondary | ICD-10-CM | POA: Diagnosis not present

## 2018-12-15 DIAGNOSIS — I10 Essential (primary) hypertension: Secondary | ICD-10-CM

## 2018-12-15 LAB — POCT URINALYSIS DIPSTICK
Bilirubin, UA: NEGATIVE
Blood, UA: NEGATIVE
Glucose, UA: NEGATIVE
Ketones, UA: NEGATIVE
Nitrite, UA: NEGATIVE
Protein, UA: NEGATIVE
Spec Grav, UA: 1.015 (ref 1.010–1.025)
Urobilinogen, UA: 0.2 E.U./dL
pH, UA: 6 (ref 5.0–8.0)

## 2018-12-15 LAB — BMP8+EGFR
BUN/Creatinine Ratio: 25 (ref 12–28)
BUN: 23 mg/dL (ref 8–27)
CO2: 22 mmol/L (ref 20–29)
Calcium: 9.8 mg/dL (ref 8.7–10.3)
Chloride: 103 mmol/L (ref 96–106)
Creatinine, Ser: 0.91 mg/dL (ref 0.57–1.00)
GFR calc Af Amer: 76 mL/min/{1.73_m2} (ref >59)
GFR calc non Af Amer: 66 mL/min/{1.73_m2} (ref >59)
Glucose: 123 mg/dL — ABNORMAL HIGH (ref 65–99)
Potassium: 4.4 mmol/L (ref 3.5–5.2)
Sodium: 143 mmol/L (ref 134–144)

## 2018-12-15 LAB — POCT UA - MICROALBUMIN
Albumin/Creatinine Ratio, Urine, POC: <30
Creatinine, POC: 100 mg/dL
Microalbumin Ur, POC: 10 mg/L

## 2018-12-15 LAB — HEMOGLOBIN A1C
Est. average glucose Bld gHb Est-mCnc: 126 mg/dL
Hgb A1c MFr Bld: 6 % — ABNORMAL HIGH (ref 4.8–5.6)

## 2018-12-15 MED ORDER — PREVNAR 13 IM SUSP: 0.5000 mL | INTRAMUSCULAR | 0 refills | Status: DC

## 2018-12-15 NOTE — Patient Instructions (Addendum)
Cindy Ross , Thank you for taking time to come for your Medicare Wellness Visit. I appreciate your ongoing commitment to your health goals. Please review the following plan we discussed and let me know if I can assist you in the future.   Screening recommendations/referrals: Colonoscopy: 02/2018 Mammogram: 08/2018 Bone Density: referred Recommended yearly ophthalmology/optometry visit for glaucoma screening and checkup Recommended yearly dental visit for hygiene and checkup  Vaccinations: Influenza vaccine: 04/2018 Pneumococcal vaccine: 08/2018 Tdap vaccine: 01/2016 Shingles vaccine: discussed    Advanced directives: Advance directive discussed with you today. I have provided a copy for you to complete at home and have notarized. Once this is complete please bring a copy in to our office so we can scan it into your chart.   Conditions/risks identified: overweight  Next appointment: 06/09/2019 at 9:00   Preventive Care 46 Years and Older, Female Preventive care refers to lifestyle choices and visits with your health care provider that can promote health and wellness. What does preventive care include?  A yearly physical exam. This is also called an annual well check.  Dental exams once or twice a year.  Routine eye exams. Ask your health care provider how often you should have your eyes checked.  Personal lifestyle choices, including:  Daily care of your teeth and gums.  Regular physical activity.  Eating a healthy diet.  Avoiding tobacco and drug use.  Limiting alcohol use.  Practicing safe sex.  Taking low-dose aspirin every day.  Taking vitamin and mineral supplements as recommended by your health care provider. What happens during an annual well check? The services and screenings done by your health care provider during your annual well check will depend on your age, overall health, lifestyle risk factors, and family history of disease. Counseling  Your health care  provider may ask you questions about your:  Alcohol use.  Tobacco use.  Drug use.  Emotional well-being.  Home and relationship well-being.  Sexual activity.  Eating habits.  History of falls.  Memory and ability to understand (cognition).  Work and work Statistician.  Reproductive health. Screening  You may have the following tests or measurements:  Height, weight, and BMI.  Blood pressure.  Lipid and cholesterol levels. These may be checked every 5 years, or more frequently if you are over 51 years old.  Skin check.  Lung cancer screening. You may have this screening every year starting at age 69 if you have a 30-pack-year history of smoking and currently smoke or have quit within the past 15 years.  Fecal occult blood test (FOBT) of the stool. You may have this test every year starting at age 62.  Flexible sigmoidoscopy or colonoscopy. You may have a sigmoidoscopy every 5 years or a colonoscopy every 10 years starting at age 56.  Hepatitis C blood test.  Hepatitis B blood test.  Sexually transmitted disease (STD) testing.  Diabetes screening. This is done by checking your blood sugar (glucose) after you have not eaten for a while (fasting). You may have this done every 1-3 years.  Bone density scan. This is done to screen for osteoporosis. You may have this done starting at age 46.  Mammogram. This may be done every 1-2 years. Talk to your health care provider about how often you should have regular mammograms. Talk with your health care provider about your test results, treatment options, and if necessary, the need for more tests. Vaccines  Your health care provider may recommend certain vaccines, such as:  Influenza vaccine. This is recommended every year.  Tetanus, diphtheria, and acellular pertussis (Tdap, Td) vaccine. You may need a Td booster every 10 years.  Zoster vaccine. You may need this after age 85.  Pneumococcal 13-valent conjugate (PCV13)  vaccine. One dose is recommended after age 5.  Pneumococcal polysaccharide (PPSV23) vaccine. One dose is recommended after age 14. Talk to your health care provider about which screenings and vaccines you need and how often you need them. This information is not intended to replace advice given to you by your health care provider. Make sure you discuss any questions you have with your health care provider. Document Released: 06/30/2015 Document Revised: 02/21/2016 Document Reviewed: 04/04/2015 Elsevier Interactive Patient Education  2017 [place] Prevention in the Home Falls can cause injuries. They can happen to people of all ages. There are many things you can do to make your home safe and to help prevent falls. What can I do on the outside of my home?  Regularly fix the edges of walkways and driveways and fix any cracks.  Remove anything that might make you trip as you walk through a door, such as a raised step or threshold.  Trim any bushes or trees on the path to your home.  Use bright outdoor lighting.  Clear any walking paths of anything that might make someone trip, such as rocks or tools.  Regularly check to see if handrails are loose or broken. Make sure that both sides of any steps have handrails.  Any raised decks and porches should have guardrails on the edges.  Have any leaves, snow, or ice cleared regularly.  Use sand or salt on walking paths during winter.  Clean up any spills in your garage right away. This includes oil or grease spills. What can I do in the bathroom?  Use night lights.  Install grab bars by the toilet and in the tub and shower. Do not use towel bars as grab bars.  Use non-skid mats or decals in the tub or shower.  If you need to sit down in the shower, use a plastic, non-slip stool.  Keep the floor dry. Clean up any water that spills on the floor as soon as it happens.  Remove soap buildup in the tub or shower regularly.   Attach bath mats securely with double-sided non-slip rug tape.  Do not have throw rugs and other things on the floor that can make you trip. What can I do in the bedroom?  Use night lights.  Make sure that you have a light by your bed that is easy to reach.  Do not use any sheets or blankets that are too big for your bed. They should not hang down onto the floor.  Have a firm chair that has side arms. You can use this for support while you get dressed.  Do not have throw rugs and other things on the floor that can make you trip. What can I do in the kitchen?  Clean up any spills right away.  Avoid walking on wet floors.  Keep items that you use a lot in easy-to-reach places.  If you need to reach something above you, use a strong step stool that has a grab bar.  Keep electrical cords out of the way.  Do not use floor polish or wax that makes floors slippery. If you must use wax, use non-skid floor wax.  Do not have throw rugs and other things on the floor that  can make you trip. What can I do with my stairs?  Do not leave any items on the stairs.  Make sure that there are handrails on both sides of the stairs and use them. Fix handrails that are broken or loose. Make sure that handrails are as long as the stairways.  Check any carpeting to make sure that it is firmly attached to the stairs. Fix any carpet that is loose or worn.  Avoid having throw rugs at the top or bottom of the stairs. If you do have throw rugs, attach them to the floor with carpet tape.  Make sure that you have a light switch at the top of the stairs and the bottom of the stairs. If you do not have them, ask someone to add them for you. What else can I do to help prevent falls?  Wear shoes that:  Do not have high heels.  Have rubber bottoms.  Are comfortable and fit you well.  Are closed at the toe. Do not wear sandals.  If you use a stepladder:  Make sure that it is fully opened. Do not climb  a closed stepladder.  Make sure that both sides of the stepladder are locked into place.  Ask someone to hold it for you, if possible.  Clearly mark and make sure that you can see:  Any grab bars or handrails.  First and last steps.  Where the edge of each step is.  Use tools that help you move around (mobility aids) if they are needed. These include:  Canes.  Walkers.  Scooters.  Crutches.  Turn on the lights when you go into a dark area. Replace any light bulbs as soon as they burn out.  Set up your furniture so you have a clear path. Avoid moving your furniture around.  If any of your floors are uneven, fix them.  If there are any pets around you, be aware of where they are.  Review your medicines with your doctor. Some medicines can make you feel dizzy. This can increase your chance of falling. Ask your doctor what other things that you can do to help prevent falls. This information is not intended to replace advice given to you by your health care provider. Make sure you discuss any questions you have with your health care provider. Document Released: 03/30/2009 Document Revised: 11/09/2015 Document Reviewed: 07/08/2014 Elsevier Interactive Patient Education  2017 [employer].

## 2018-12-15 NOTE — Patient Instructions (Signed)
Diabetes Mellitus and Foot Care Foot care is an important part of your health, especially when you have diabetes. Diabetes may cause you to have problems because of poor blood flow (circulation) to your feet and legs, which can cause your skin to:  Become thinner and drier.  Break more easily.  Heal more slowly.  Peel and crack. You may also have nerve damage (neuropathy) in your legs and feet, causing decreased feeling in them. This means that you may not notice minor injuries to your feet that could lead to more serious problems. Noticing and addressing any potential problems early is the best way to prevent future foot problems. How to care for your feet Foot hygiene  Wash your feet daily with warm water and mild soap. Do not use hot water. Then, pat your feet and the areas between your toes until they are completely dry. Do not soak your feet as this can dry your skin.  Trim your toenails straight across. Do not dig under them or around the cuticle. File the edges of your nails with an emery board or nail file.  Apply a moisturizing lotion or petroleum jelly to the skin on your feet and to dry, brittle toenails. Use lotion that does not contain alcohol and is unscented. Do not apply lotion between your toes. Shoes and socks  Wear clean socks or stockings every day. Make sure they are not too tight. Do not wear knee-high stockings since they may decrease blood flow to your legs.  Wear shoes that fit properly and have enough cushioning. Always look in your shoes before you put them on to be sure there are no objects inside.  To break in new shoes, wear them for just a few hours a day. This prevents injuries on your feet. Wounds, scrapes, corns, and calluses  Check your feet daily for blisters, cuts, bruises, sores, and redness. If you cannot see the bottom of your feet, use a mirror or ask someone for help.  Do not cut corns or calluses or try to remove them with medicine.  If you  find a minor scrape, cut, or break in the skin on your feet, keep it and the skin around it clean and dry. You may clean these areas with mild soap and water. Do not clean the area with peroxide, alcohol, or iodine.  If you have a wound, scrape, corn, or callus on your foot, look at it several times a day to make sure it is healing and not infected. Check for: ? Redness, swelling, or pain. ? Fluid or blood. ? Warmth. ? Pus or a bad smell. General instructions  Do not cross your legs. This may decrease blood flow to your feet.  Do not use heating pads or hot water bottles on your feet. They may burn your skin. If you have lost feeling in your feet or legs, you may not know this is happening until it is too late.  Protect your feet from hot and cold by wearing shoes, such as at the beach or on hot pavement.  Schedule a complete foot exam at least once a year (annually) or more often if you have foot problems. If you have foot problems, report any cuts, sores, or bruises to your health care provider immediately. Contact a health care provider if:  You have a medical condition that increases your risk of infection and you have any cuts, sores, or bruises on your feet.  You have an injury that is not   healing.  You have redness on your legs or feet.  You feel burning or tingling in your legs or feet.  You have pain or cramps in your legs and feet.  Your legs or feet are numb.  Your feet always feel cold.  You have pain around a toenail. Get help right away if:  You have a wound, scrape, corn, or callus on your foot and: ? You have pain, swelling, or redness that gets worse. ? You have fluid or blood coming from the wound, scrape, corn, or callus. ? Your wound, scrape, corn, or callus feels warm to the touch. ? You have pus or a bad smell coming from the wound, scrape, corn, or callus. ? You have a fever. ? You have a red line going up your leg. Summary  Check your feet every day  for cuts, sores, red spots, swelling, and blisters.  Moisturize feet and legs daily.  Wear shoes that fit properly and have enough cushioning.  If you have foot problems, report any cuts, sores, or bruises to your health care provider immediately.  Schedule a complete foot exam at least once a year (annually) or more often if you have foot problems. This information is not intended to replace advice given to you by your health care provider. Make sure you discuss any questions you have with your health care provider. Document Released: 05/31/2000 Document Revised: 07/16/2017 Document Reviewed: 07/05/2016 Elsevier Patient Education  2020 [employer]  

## 2018-12-15 NOTE — Addendum Note (Signed)
Addended by: Kellie Simmering on: 12/15/2018 12:28 PM   Modules accepted: Orders

## 2018-12-15 NOTE — Progress Notes (Signed)
Subjective:   Cindy Ross is a 68 y.o. female who presents for an Initial Medicare Annual Wellness Visit.  Review of Systems    n/a  Cardiac Risk Factors include: advanced age (>91men, >93 women);diabetes mellitus;dyslipidemia;hypertension     Objective:    Today's Vitals   12/15/18 0849  BP: 138/78  Pulse: 78  Temp: 98.6 F (37 C)  TempSrc: Oral  SpO2: 97%  Weight: 129 lb 6.4 oz (58.7 kg)  Height: 4' 11.6" (1.514 m)  PainSc: 0-No pain   Body mass index is 25.61 kg/m.  Advanced Directives 12/15/2018 11/18/2016  Does Patient Have a Medical Advance Directive? No No  Would patient like information on creating a medical advance directive? Yes (MAU/Ambulatory/Procedural Areas - Information given) Yes (MAU/Ambulatory/Procedural Areas - Information given)    Current Medications (verified) Outpatient Encounter Medications as of 12/15/2018  Medication Sig  . aspirin EC 81 MG tablet Take 1 tablet (81 mg total) by mouth daily.  Marland Kitchen atorvastatin (LIPITOR) 20 MG tablet TAKE 1 TABLET BY MOUTH EVERY DAY  . Blood Glucose Monitoring Suppl (AGAMATRIX PRESTO PRO METER) DEVI Check sugars twice daily  . cholecalciferol (VITAMIN D) 1000 units tablet Take 1,000 Units by mouth daily.  Marland Kitchen glucose blood (AGAMATRIX PRESTO TEST) test strip Twice daily glucose checks  . metFORMIN (GLUCOPHAGE) 1000 MG tablet TAKE 1 TABLET BY MOUTH TWICE A DAY  . Multiple Vitamins-Minerals (ONE-A-DAY WOMENS 50+ ADVANTAGE PO) Take 1 tablet by mouth daily at 12 noon.  Marland Kitchen olmesartan-hydrochlorothiazide (BENICAR HCT) 40-12.5 MG tablet TAKE 1 TABLET BY MOUTH EVERY DAY  . pneumococcal 13-valent conjugate vaccine (PREVNAR 13) SUSP injection Inject 0.5 mLs into the muscle tomorrow at 10 am for 1 dose.   No facility-administered encounter medications on file as of 12/15/2018.     Allergies (verified) Patient has no known allergies.   History: Past Medical History:  Diagnosis Date  . Diabetes mellitus without complication ([place])    . Hyperlipidemia 01/30/2016  . Hypertension 2007  . Respiratory crackles at left lung base 05/19/2017   CXR 02/2017 in [facility] normal   History reviewed. No pertinent surgical history. Family History  Problem Relation Age of Onset  . Hyperlipidemia Sister   . Diabetes Sister   . Hypertension Sister   . Heart disease Brother   . Liver disease Brother    Social History   Socioeconomic History  . Marital status: Widowed    Spouse name: Not on file  . Number of children: 1  . Years of education: 31  . Highest education level: Not on file  Occupational History  . Occupation: house cleaning.    Comment: Worked for bakery in past  . Occupation: retired  Scientific laboratory technician  . Financial resource strain: Not hard at all  . Food insecurity    Worry: Never true    Inability: Never true  . Transportation needs    Medical: No    Non-medical: No  Tobacco Use  . Smoking status: Never Smoker  . Smokeless tobacco: Never Used  Substance and Sexual Activity  . Alcohol use: No    Alcohol/week: 0.0 standard drinks  . Drug use: No  . Sexual activity: Not Currently  Lifestyle  . Physical activity    Days per week: 0 days    Minutes per session: 0 min  . Stress: Not at all  Relationships  . Social Herbalist on phone: Not on file    Gets together: Not on file  Attends religious service: Not on file    Active member of club or organization: Not on file    Attends meetings of clubs or organizations: Not on file    Relationship status: Not on file  Other Topics Concern  . Not on file  Social History Narrative   Originally from [place]   Came to [place]. In 48   Lives with younger sister, Cindy Ross   Son lives in [place].  Works in [employer].      Tobacco Counseling Counseling given: Not Answered   Clinical Intake:  Pre-visit preparation completed: Yes  Pain : No/denies pain Pain Score: 0-No pain     Nutritional Status: BMI 25 -29 Overweight  Nutritional Risks: None Diabetes: Yes CBG done?: No Did pt. bring in CBG monitor from home?: No  How often do you need to have someone help you when you read instructions, pamphlets, or other written materials from your doctor or pharmacy?: 1 - Never What is the last grade level you completed in school?: 11th grade  Interpreter Needed?: Yes Interpreter Agency: [employer] Interpreter Name: Cindy Ross Patient Declined Interpreter : No  Information entered by :: NAllen LPN   Activities of Daily Living In your present state of health, do you have any difficulty performing the following activities: 12/15/2018 06/02/2018  Hearing? N N  Vision? N N  Difficulty concentrating or making decisions? N N  Walking or climbing stairs? N N  Dressing or bathing? N N  Doing errands, shopping? N N  Preparing Food and eating ? N -  Using the Toilet? N -  In the past six months, have you accidently leaked urine? N -  Do you have problems with loss of bowel control? N -  Managing your Medications? N -  Managing your Finances? N -  Housekeeping or managing your Housekeeping? N -  Some recent data might be hidden     Immunizations and Health Maintenance Immunization History  Administered Date(s) Administered  . Influenza Inj Mdck Quad Pf 07/31/2016, 05/19/2017  . Influenza-Unspecified 04/17/2018  . Pneumococcal Polysaccharide-23 03/19/2016  . Td 10/31/1994, 04/10/1995  . Tdap 01/30/2016   Health Maintenance Due  Topic Date Due  . DEXA SCAN  11/05/2017  . PNA vac Low Risk Adult (1 of 2 - PCV13) 11/05/2017  . OPHTHALMOLOGY EXAM  12/10/2018    Patient Care Team: Glendale Chard, MD as PCP - General (Internal Medicine)  Indicate any recent Medical Services you may have received from other than [facility] providers in the past year (date may be approximate).     Assessment:   This is a routine wellness examination for Cindy Ross.  Hearing/Vision screen  Hearing Screening   125Hz  250Hz  500Hz   1000Hz  2000Hz  3000Hz  4000Hz  6000Hz  8000Hz   Right ear:           Left ear:           Vision Screening Comments: Annual eye exams  Dietary issues and exercise activities discussed: Current Exercise Habits: The patient does not participate in regular exercise at present  Goals    . Patient Stated     Wants to stay healthy      Depression Screen PHQ 2/9 Scores 12/15/2018 09/08/2018 06/02/2018 11/28/2015  PHQ - 2 Score 0 0 0 0  PHQ- 9 Score 0 - - 0    Fall Risk Fall Risk  12/15/2018 09/08/2018 06/02/2018  Falls in the past year? 0 0 0  Risk for fall due to : Medication  side effect - -  Follow up Falls evaluation completed;Education provided;Falls prevention discussed - -    Is the patient's home free of loose throw rugs in walkways, pet beds, electrical cords, etc?   yes      Grab bars in the bathroom? no      Handrails on the stairs?   yes      Adequate lighting?   yes  Timed Get Up and Go Performed n/a  Cognitive Function: MMSE - Mini Mental State Exam 06/02/2018  Orientation to time 5  Orientation to Place 5  Attention/ Calculation 5  Language- name 2 objects 2  Language- repeat 1  Language- read & follow direction 1  Write a sentence 1  Copy design 1     6CIT Screen 06/02/2018  What Year? 0 points  What month? 0 points  What time? 0 points  Count back from 20 0 points  Months in reverse 0 points  Repeat phrase 0 points  Total Score 0    Screening Tests Health Maintenance  Topic Date Due  . DEXA SCAN  11/05/2017  . PNA vac Low Risk Adult (1 of 2 - PCV13) 11/05/2017  . OPHTHALMOLOGY EXAM  12/10/2018  . INFLUENZA VACCINE  01/16/2019  . HEMOGLOBIN A1C  03/11/2019  . FOOT EXAM  06/03/2019  . MAMMOGRAM  08/27/2020  . TETANUS/TDAP  01/29/2026  . COLONOSCOPY  02/24/2028  . Hepatitis C Screening  Completed    Qualifies for Shingles Vaccine? yes  Cancer Screenings: Lung: Low Dose CT Chest recommended if Age 60-80 years, 30 pack-year currently smoking OR have  quit w/in 15years. Patient does not qualify. Breast: Up to date on Mammogram? Yes   Up to date of Bone Density/Dexa? No Colorectal: up to date  Additional Screenings:  Hepatitis C Screening: 01/27/2014     Plan:    6 CIT not done due to language barrier. Referral for DEXA scan  I have personally reviewed and noted the following in the patient's chart:   . Medical and social history . Use of alcohol, tobacco or illicit drugs  . Current medications and supplements . Functional ability and status . Nutritional status . Physical activity . Advanced directives . List of other physicians . Hospitalizations, surgeries, and ER visits in previous 12 months . Vitals . Screenings to include cognitive, depression, and falls . Referrals and appointments  In addition, I have reviewed and discussed with patient certain preventive protocols, quality metrics, and best practice recommendations. A written personalized care plan for preventive services as well as general preventive health recommendations were provided to patient.     Kellie Simmering, LPN   2/63/[id number]

## 2018-12-27 NOTE — Progress Notes (Signed)
Subjective:     Patient ID: Cindy Ross , female    DOB: 09-21-1950 , 68 y.o.   MRN: [id number]   Chief Complaint  Patient presents with  . Hypertension  . Hyperlipidemia  . Diabetes    HPI  She presents today for bp/dm check.  She is accompanied by a translator today. She has no specific concerns or complaints at this time. This is my first time seeing this pleasant 68 year old female. She is usually followed by Sunday Spillers, [place] or Doreene Burke, NP. She reports compliance with meds. She denies having any questions regarding her chronic illnesses.   Hypertension This is a chronic problem. The current episode started more than 1 year ago. The problem has been gradually improving since onset. The problem is controlled. Pertinent negatives include no blurred vision, chest pain, palpitations or shortness of breath. Risk factors for coronary artery disease include diabetes mellitus, dyslipidemia and post-menopausal state. The current treatment provides moderate improvement.  Hyperlipidemia This is a chronic problem. The current episode started more than 1 year ago. The problem is controlled. Exacerbating diseases include diabetes. There are no known factors aggravating her hyperlipidemia. Pertinent negatives include no chest pain or shortness of breath. Current antihyperlipidemic treatment includes statins. The current treatment provides moderate improvement of lipids.  Diabetes She presents for her follow-up diabetic visit. She has type 2 diabetes mellitus. There are no hypoglycemic associated symptoms. Pertinent negatives for diabetes include no blurred vision and no chest pain. There are no hypoglycemic complications. She is compliant with treatment most of the time. She is following a diabetic diet. When asked about meal planning, she reported none. She participates in exercise intermittently. Her home blood glucose trend is fluctuating minimally. An ACE inhibitor/angiotensin II receptor blocker is being  taken. Eye exam is not current.     Past Medical History:  Diagnosis Date  . Diabetes mellitus without complication ([place])   . Hyperlipidemia 01/30/2016  . Hypertension 2007  . Respiratory crackles at left lung base 05/19/2017   CXR 02/2017 in [facility] normal     Family History  Problem Relation Age of Onset  . Hyperlipidemia Sister   . Diabetes Sister   . Hypertension Sister   . Heart disease Brother   . Liver disease Brother      Current Outpatient Medications:  .  aspirin EC 81 MG tablet, Take 1 tablet (81 mg total) by mouth daily., Disp: , Rfl:  .  atorvastatin (LIPITOR) 20 MG tablet, TAKE 1 TABLET BY MOUTH EVERY DAY, Disp: 90 tablet, Rfl: 1 .  Blood Glucose Monitoring Suppl (AGAMATRIX PRESTO PRO METER) DEVI, Check sugars twice daily, Disp: 1 Device, Rfl: 0 .  cholecalciferol (VITAMIN D) 1000 units tablet, Take 1,000 Units by mouth daily., Disp: , Rfl:  .  glucose blood (AGAMATRIX PRESTO TEST) test strip, Twice daily glucose checks, Disp: 100 each, Rfl: 11 .  metFORMIN (GLUCOPHAGE) 1000 MG tablet, TAKE 1 TABLET BY MOUTH TWICE A DAY, Disp: 180 tablet, Rfl: 1 .  Multiple Vitamins-Minerals (ONE-A-DAY WOMENS 50+ ADVANTAGE PO), Take 1 tablet by mouth daily at 12 noon., Disp: , Rfl:  .  olmesartan-hydrochlorothiazide (BENICAR HCT) 40-12.5 MG tablet, TAKE 1 TABLET BY MOUTH EVERY DAY, Disp: 90 tablet, Rfl: 0   No Known Allergies   Review of Systems  Constitutional: Negative.   Eyes: Negative for blurred vision.  Respiratory: Negative.  Negative for shortness of breath.   Cardiovascular: Negative.  Negative for chest pain and palpitations.  Gastrointestinal: Negative.  Neurological: Negative.   Psychiatric/Behavioral: Negative.      Today's Vitals   12/15/18 0921  BP: 138/78  Pulse: 78  Temp: 98.6 F (37 C)  TempSrc: Oral  Weight: 129 lb 6.4 oz (58.7 kg)  Height: 4' 11.6" (1.514 m)  PainSc: 0-No pain   Body mass index is 25.61 kg/m.   Objective:   Physical Exam Vitals signs and nursing note reviewed.  Constitutional:      Appearance: Normal appearance.  HENT:     Head: Normocephalic and atraumatic.  Cardiovascular:     Rate and Rhythm: Normal rate and regular rhythm.     Heart sounds: Normal heart sounds.  Pulmonary:     Effort: Pulmonary effort is normal.     Breath sounds: Normal breath sounds.  Skin:    General: Skin is warm.  Neurological:     General: No focal deficit present.     Mental Status: She is alert.  Psychiatric:        Mood and Affect: Mood normal.        Behavior: Behavior normal.         Assessment And Plan:     1. Essential hypertension  Fair control. She will continue with current meds. She is encouraged to avoid adding salt to her foods.   2. Pure hypercholesterolemia  Chronic. She is encouraged to comply with current meds and to avoid fried foods when possible.   3. Controlled type 2 diabetes mellitus with complication, without long-term current use of insulin ([place])  I will check labs as listed below. She is encouraged to avoid sugary beverages. Dietary compliance was also stressed to the patient. Previous labs reviewed in full detail.   - BMP8+EGFR - Hemoglobin A1c  4. Decreased estrogen level  She has been referred for bone density by [facility]. Importance of calcium and vitamin D supplementation, and participation in weight bearing exercises was discussed with the patient.   [name], MD    THE PATIENT IS ENCOURAGED TO PRACTICE SOCIAL DISTANCING DUE TO THE COVID-19 PANDEMIC.

## 2019-01-11 DIAGNOSIS — E113292 Type 2 diabetes mellitus with mild nonproliferative diabetic retinopathy without macular edema, left eye: Secondary | ICD-10-CM | POA: Diagnosis not present

## 2019-02-10 ENCOUNTER — Other Ambulatory Visit: Payer: Self-pay | Admitting: Internal Medicine

## 2019-02-10 DIAGNOSIS — E118 Type 2 diabetes mellitus with unspecified complications: Secondary | ICD-10-CM

## 2019-02-11 ENCOUNTER — Other Ambulatory Visit: Payer: Self-pay | Admitting: Internal Medicine

## 2019-04-06 ENCOUNTER — Encounter: Payer: Self-pay | Admitting: Internal Medicine

## 2019-04-26 ENCOUNTER — Ambulatory Visit: Payer: Medicare Other | Admitting: Internal Medicine

## 2019-05-04 ENCOUNTER — Other Ambulatory Visit: Payer: Self-pay | Admitting: Internal Medicine

## 2019-05-04 DIAGNOSIS — E782 Mixed hyperlipidemia: Secondary | ICD-10-CM

## 2019-05-06 ENCOUNTER — Encounter: Payer: Self-pay | Admitting: Internal Medicine

## 2019-05-06 ENCOUNTER — Other Ambulatory Visit: Payer: Self-pay

## 2019-05-06 ENCOUNTER — Ambulatory Visit (INDEPENDENT_AMBULATORY_CARE_PROVIDER_SITE_OTHER): Payer: Medicare Other | Admitting: Internal Medicine

## 2019-05-06 VITALS — BP 132/68 | HR 82 | Temp 98.4°F | Ht 59.6 in | Wt 129.6 lb

## 2019-05-06 DIAGNOSIS — I10 Essential (primary) hypertension: Secondary | ICD-10-CM

## 2019-05-06 DIAGNOSIS — E118 Type 2 diabetes mellitus with unspecified complications: Secondary | ICD-10-CM

## 2019-05-06 DIAGNOSIS — E119 Type 2 diabetes mellitus without complications: Secondary | ICD-10-CM | POA: Diagnosis not present

## 2019-05-06 DIAGNOSIS — E782 Mixed hyperlipidemia: Secondary | ICD-10-CM

## 2019-05-06 LAB — CMP14 + ANION GAP
ALT: 18 IU/L (ref 0–32)
AST: 19 IU/L (ref 0–40)
Albumin/Globulin Ratio: 1.4 (ref 1.2–2.2)
Albumin: 4.5 g/dL (ref 3.8–4.8)
Alkaline Phosphatase: 76 IU/L (ref 39–117)
Anion Gap: 16 mmol/L (ref 10.0–18.0)
BUN/Creatinine Ratio: 26 (ref 12–28)
BUN: 21 mg/dL (ref 8–27)
Bilirubin Total: 0.4 mg/dL (ref 0.0–1.2)
CO2: 21 mmol/L (ref 20–29)
Calcium: 9.8 mg/dL (ref 8.7–10.3)
Chloride: 103 mmol/L (ref 96–106)
Creatinine, Ser: 0.81 mg/dL (ref 0.57–1.00)
GFR calc Af Amer: 88 mL/min/{1.73_m2} (ref >59)
GFR calc non Af Amer: 76 mL/min/{1.73_m2} (ref >59)
Globulin, Total: 3.2 g/dL (ref 1.5–4.5)
Glucose: 95 mg/dL (ref 65–99)
Potassium: 4.1 mmol/L (ref 3.5–5.2)
Sodium: 140 mmol/L (ref 134–144)
Total Protein: 7.7 g/dL (ref 6.0–8.5)

## 2019-05-06 LAB — CBC
Hematocrit: 31 % — ABNORMAL LOW (ref 34.0–46.6)
Hemoglobin: 10.3 g/dL — ABNORMAL LOW (ref 11.1–15.9)
MCH: 29.9 pg (ref 26.6–33.0)
MCHC: 33.2 g/dL (ref 31.5–35.7)
MCV: 90 fL (ref 79–97)
Platelets: 211 10*3/uL (ref 150–450)
RBC: 3.45 x10E6/uL — ABNORMAL LOW (ref 3.77–5.28)
RDW: 12.4 % (ref 11.7–15.4)
WBC: 5.8 10*3/uL (ref 3.4–10.8)

## 2019-05-06 LAB — HEMOGLOBIN A1C
Est. average glucose Bld gHb Est-mCnc: 128 mg/dL
Hgb A1c MFr Bld: 6.1 % — ABNORMAL HIGH (ref 4.8–5.6)

## 2019-05-06 MED ORDER — OLMESARTAN MEDOXOMIL-HCTZ 40-12.5 MG PO TABS: 1.0000 | ORAL_TABLET | Freq: Every day | ORAL | 0 refills | Status: DC

## 2019-05-06 MED ORDER — METFORMIN HCL 1000 MG PO TABS: 1000.0000 mg | ORAL_TABLET | Freq: Two times a day (BID) | ORAL | 0 refills | Status: DC

## 2019-05-06 MED ORDER — ATORVASTATIN CALCIUM 20 MG PO TABS: 20.0000 mg | ORAL_TABLET | Freq: Every day | ORAL | 0 refills | Status: DC

## 2019-05-06 NOTE — Addendum Note (Signed)
Addended by: Candiss Norse T on: 05/06/2019 09:11 AM   Modules accepted: Orders

## 2019-05-06 NOTE — Progress Notes (Signed)
Subjective:     Patient ID: Cindy Ross , female    DOB: 05/16/1951 , 68 y.o.   MRN: [id number]   Chief Complaint  Patient presents with  . Hypertension    HPI  Hereo fr Dm and HTN FU. She does not check her glucose or BP. A few times has gonve to [facility] and checked her BP and her systolic runs around [id number]. Denies polydipsia, polyuria, wounds, or neuropathy symptoms.   Past Medical History:  Diagnosis Date  . Diabetes mellitus without complication ([place])   . Hyperlipidemia 01/30/2016  . Hypertension 2007  . Respiratory crackles at left lung base 05/19/2017   CXR 02/2017 in [facility] normal     Family History  Problem Relation Age of Onset  . Hyperlipidemia Sister   . Diabetes Sister   . Hypertension Sister   . Heart disease Brother   . Liver disease Brother      Current Outpatient Medications:  .  aspirin EC 81 MG tablet, Take 1 tablet (81 mg total) by mouth daily., Disp: , Rfl:  .  atorvastatin (LIPITOR) 20 MG tablet, TAKE 1 TABLET BY MOUTH EVERY DAY, Disp: 90 tablet, Rfl: 1 .  cholecalciferol (VITAMIN D) 1000 units tablet, Take 1,000 Units by mouth daily., Disp: , Rfl:  .  metFORMIN (GLUCOPHAGE) 1000 MG tablet, TAKE 1 TABLET BY MOUTH TWICE A DAY, Disp: 180 tablet, Rfl: 1 .  Multiple Vitamins-Minerals (ONE-A-DAY WOMENS 50+ ADVANTAGE PO), Take 1 tablet by mouth daily at 12 noon., Disp: , Rfl:  .  olmesartan-hydrochlorothiazide (BENICAR HCT) 40-12.5 MG tablet, TAKE 1 TABLET BY MOUTH EVERY DAY, Disp: 90 tablet, Rfl: 0 .  Blood Glucose Monitoring Suppl (AGAMATRIX PRESTO PRO METER) DEVI, Check sugars twice daily (Patient not taking: Reported on 05/06/2019), Disp: 1 Device, Rfl: 0 .  glucose blood (AGAMATRIX PRESTO TEST) test strip, Twice daily glucose checks (Patient not taking: Reported on 05/06/2019), Disp: 100 each, Rfl: 11   No Known Allergies      Review of Systems  Constitutional: Negative for diaphoresis and unexpected weight change.  HENT: Negative for  tinnitus.   Eyes: Negative for visual disturbance.  Respiratory: Negative for chest tightness and shortness of breath.   Cardiovascular: Negative for chest pain, palpitations and leg swelling.  Gastrointestinal: Negative for constipation, diarrhea and nausea.  Endocrine: Negative for polydipsia, polyphagia and polyuria.  Genitourinary: Negative for dysuria and frequency.  Skin: Negative for rash and wound today, but some days gets an itchy rash on her R thigh, and resolves on its own. Not present today.   Neurological: Negative for dizziness, speech difficulty, weakness, numbness and headaches.   Today's Vitals   05/06/19 0840  BP: 132/68  Pulse: 82  Temp: 98.4 F (36.9 C)  TempSrc: Oral  Weight: 129 lb 9.6 oz (58.8 kg)  Height: 4' 11.6" (1.514 m)   Body mass index is 25.65 kg/m.   Objective:  Physical Exam   Constitutional: She is oriented to person, place, and time. She appears well-developed and well-nourished. No distress.  HENT:  Head: Normocephalic and atraumatic.  Right Ear: External ear normal.  Left Ear: External ear normal.  Nose: Nose normal.  Eyes: Conjunctivae are normal. Right eye exhibits no discharge. Left eye exhibits no discharge. No scleral icterus.  Neck: Neck supple. No thyromegaly present.  No carotid bruits bilaterally  Cardiovascular: Normal rate and regular rhythm.  No murmur heard. Pulmonary/Chest: Effort normal and breath sounds normal. No respiratory distress.  Musculoskeletal: Normal range  of motion. She exhibits no edema.  Lymphadenopathy:    She has no cervical adenopathy.  Neurological: She is alert and oriented to person, place, and time.  Skin: Skin is warm and dry. Capillary refill takes less than 2 seconds. No rash noted. She is not diaphoretic.  Psychiatric: She has a normal mood and affect. Her behavior is normal. Judgment and thought content normal.  Nursing note reviewed.     Assessment And Plan:     1. Type 2 diabetes mellitus  without complication, without long-term current use of insulin ([place])- chronic - Hemoglobin A1c  2. Essential hypertension- stable - CMP14 + Anion Gap - CBC no Diff   I refilled her medications, and FU in 3 months. Advised to come in when she has the rash next time so we can look at it.   Ryder Chesmore RODRIGUEZ-SOUTHWORTH, PA-C    THE PATIENT IS ENCOURAGED TO PRACTICE SOCIAL DISTANCING DUE TO THE COVID-19 PANDEMIC.

## 2019-05-11 ENCOUNTER — Telehealth: Payer: Self-pay

## 2019-05-11 ENCOUNTER — Other Ambulatory Visit (HOSPITAL_COMMUNITY): Payer: Self-pay | Admitting: Internal Medicine

## 2019-05-11 ENCOUNTER — Encounter: Payer: Self-pay | Admitting: Internal Medicine

## 2019-05-11 DIAGNOSIS — D508 Other iron deficiency anemias: Secondary | ICD-10-CM

## 2019-05-11 DIAGNOSIS — R718 Other abnormality of red blood cells: Secondary | ICD-10-CM

## 2019-05-11 NOTE — Telephone Encounter (Signed)
Spoke with patient, also left vm on cell number for son, or sister to call back to receive message  Rodriguez-[place], Sandrea Matte  Candiss Norse T, CMA        Please call her son and inform him that her DM is stable, chemistry is normal, and her anemia is a little worse. Needs to eat more greens, beef, prunes, and Iron called Ferrus Sulfate 325 mg one a day. We need to recheck a CBC in 1 month.

## 2019-05-12 ENCOUNTER — Telehealth: Payer: Self-pay

## 2019-05-12 NOTE — Telephone Encounter (Signed)
LVM returning pt call. 

## 2019-06-09 ENCOUNTER — Ambulatory Visit: Payer: Medicare Other

## 2019-06-09 ENCOUNTER — Ambulatory Visit: Payer: Medicare Other | Admitting: Internal Medicine

## 2019-07-15 DIAGNOSIS — E113292 Type 2 diabetes mellitus with mild nonproliferative diabetic retinopathy without macular edema, left eye: Secondary | ICD-10-CM | POA: Diagnosis not present

## 2019-07-15 LAB — HM DIABETES EYE EXAM

## 2019-07-25 ENCOUNTER — Ambulatory Visit: Payer: Medicare Other | Attending: Internal Medicine

## 2019-07-28 ENCOUNTER — Other Ambulatory Visit: Payer: Self-pay | Admitting: Internal Medicine

## 2019-07-30 ENCOUNTER — Other Ambulatory Visit: Payer: Self-pay | Admitting: Internal Medicine

## 2019-07-30 DIAGNOSIS — Z1231 Encounter for screening mammogram for malignant neoplasm of breast: Secondary | ICD-10-CM

## 2019-08-10 ENCOUNTER — Ambulatory Visit: Payer: Medicare Other

## 2019-08-10 ENCOUNTER — Ambulatory Visit: Payer: Self-pay

## 2019-08-23 ENCOUNTER — Ambulatory Visit (INDEPENDENT_AMBULATORY_CARE_PROVIDER_SITE_OTHER): Payer: Medicare Other | Admitting: Internal Medicine

## 2019-08-23 ENCOUNTER — Encounter: Payer: Self-pay | Admitting: Internal Medicine

## 2019-08-23 ENCOUNTER — Other Ambulatory Visit: Payer: Self-pay

## 2019-08-23 VITALS — BP 116/74 | HR 76 | Temp 98.8°F | Ht 59.6 in | Wt 127.2 lb

## 2019-08-23 DIAGNOSIS — E119 Type 2 diabetes mellitus without complications: Secondary | ICD-10-CM

## 2019-08-23 DIAGNOSIS — I10 Essential (primary) hypertension: Secondary | ICD-10-CM

## 2019-08-23 DIAGNOSIS — Z6825 Body mass index (BMI) 25.0-25.9, adult: Secondary | ICD-10-CM

## 2019-08-23 DIAGNOSIS — E782 Mixed hyperlipidemia: Secondary | ICD-10-CM

## 2019-08-23 DIAGNOSIS — E2839 Other primary ovarian failure: Secondary | ICD-10-CM

## 2019-08-23 DIAGNOSIS — E663 Overweight: Secondary | ICD-10-CM

## 2019-08-23 MED ORDER — METFORMIN HCL 1000 MG PO TABS: 1000.0000 mg | ORAL_TABLET | Freq: Two times a day (BID) | ORAL | 2 refills | Status: DC

## 2019-08-23 MED ORDER — OLMESARTAN MEDOXOMIL-HCTZ 40-12.5 MG PO TABS: 1.0000 | ORAL_TABLET | Freq: Every day | ORAL | 2 refills | Status: DC

## 2019-08-23 MED ORDER — ATORVASTATIN CALCIUM 20 MG PO TABS: 20.0000 mg | ORAL_TABLET | Freq: Every day | ORAL | 2 refills | Status: DC

## 2019-08-23 NOTE — Progress Notes (Signed)
This visit occurred during the SARS-CoV-2 public health emergency.  Safety protocols were in place, including screening questions prior to the visit, additional usage of staff PPE, and extensive cleaning of exam room while observing appropriate contact time as indicated for disinfecting solutions.  Subjective:     Patient ID: Cindy Ross , female    DOB: 06/21/1950 , 70 y.o.   MRN: [id number]   Chief Complaint  Patient presents with  . Hypertension  . Diabetes    HPI  She presents today for bp/dm check.  She is accompanied by an interpreter today. She has no specific concerns or complaints at this time. She is usually followed by Sunday Spillers, [place] or Doreene Burke, NP. She reports compliance with meds. She denies having any questions regarding her chronic illnesses.   Hypertension This is a chronic problem. The current episode started more than 1 year ago. The problem has been gradually improving since onset. The problem is controlled. Pertinent negatives include no blurred vision or palpitations. Risk factors for coronary artery disease include diabetes mellitus, dyslipidemia and post-menopausal state. The current treatment provides moderate improvement.  Diabetes She presents for her follow-up diabetic visit. She has type 2 diabetes mellitus. There are no hypoglycemic associated symptoms. Pertinent negatives for diabetes include no blurred vision. There are no hypoglycemic complications. She is compliant with treatment most of the time. She is following a diabetic diet. When asked about meal planning, she reported none. She participates in exercise intermittently. Her home blood glucose trend is fluctuating minimally. An ACE inhibitor/angiotensin II receptor blocker is being taken. Eye exam is not current.     Past Medical History:  Diagnosis Date  . Diabetes mellitus without complication ([place])   . Hyperlipidemia 01/30/2016  . Hypertension 2007  . Respiratory crackles at left lung base 05/19/2017   CXR  02/2017 in [facility] normal     Family History  Problem Relation Age of Onset  . Hyperlipidemia Sister   . Diabetes Sister   . Hypertension Sister   . Heart disease Brother   . Liver disease Brother      Current Outpatient Medications:  .  aspirin EC 81 MG tablet, Take 1 tablet (81 mg total) by mouth daily., Disp: , Rfl:  .  atorvastatin (LIPITOR) 20 MG tablet, Take 1 tablet (20 mg total) by mouth daily., Disp: 90 tablet, Rfl: 2 .  cholecalciferol (VITAMIN D) 1000 units tablet, Take 1,000 Units by mouth daily., Disp: , Rfl:  .  metFORMIN (GLUCOPHAGE) 1000 MG tablet, Take 1 tablet (1,000 mg total) by mouth 2 (two) times daily., Disp: 180 tablet, Rfl: 2 .  Multiple Vitamins-Minerals (ONE-A-DAY WOMENS 50+ ADVANTAGE PO), Take 1 tablet by mouth daily at 12 noon., Disp: , Rfl:  .  olmesartan-hydrochlorothiazide (BENICAR HCT) 40-12.5 MG tablet, Take 1 tablet by mouth daily., Disp: 90 tablet, Rfl: 2   No Known Allergies   Review of Systems  Constitutional: Negative.   Eyes: Negative for blurred vision.  Respiratory: Negative.   Cardiovascular: Negative.  Negative for palpitations.  Gastrointestinal: Negative.   Neurological: Negative.   Psychiatric/Behavioral: Negative.      Today's Vitals   08/23/19 0843  BP: 116/74  Pulse: 76  Temp: 98.8 F (37.1 C)  TempSrc: Oral  Weight: 127 lb 3.2 oz (57.7 kg)  Height: 4' 11.6" (1.514 m)  PainSc: 0-No pain   Body mass index is 25.18 kg/m.   Objective:  Physical Exam Vitals and nursing note reviewed.  Constitutional:  Appearance: Normal appearance.  HENT:     Head: Normocephalic and atraumatic.  Cardiovascular:     Rate and Rhythm: Normal rate and regular rhythm.     Heart sounds: Normal heart sounds.  Pulmonary:     Effort: Pulmonary effort is normal.     Breath sounds: Normal breath sounds.  Skin:    General: Skin is warm.  Neurological:     General: No focal deficit present.     Mental Status: She is  alert.  Psychiatric:        Mood and Affect: Mood normal.        Behavior: Behavior normal.         Assessment And Plan:     1. Essential hypertension  Chronic, well controlled. She will continue with current meds.   2. Type 2 diabetes mellitus without complication, without long-term current use of insulin ([place])  She is encouraged to check her feet daily. I will check labs as listed below. She will rto in 4 months for a full physical exam.   - Hemoglobin A1c - BMP8+EGFR  3. Mixed hyperlipidemia  Chronic. I will check fasting lipid panel.  She is encouraged to avoid fried foods and to continue with her regular exercise regimen.   - atorvastatin (LIPITOR) 20 MG tablet; Take 1 tablet (20 mg total) by mouth daily.  Dispense: 90 tablet; Refill: 2 - Lipid panel  4. Estrogen deficiency  She is scheduled for mammo on 3/24. I will have [facility] to see if bone density can be scheduled on the same day.   5. Overweight with body mass index (BMI) of 25 to 25.9 in adult  Her weight is stable for her demographic. She was congratulated on her regular walking schedule and encouraged to keep it up.   [name], MD    THE PATIENT IS ENCOURAGED TO PRACTICE SOCIAL DISTANCING DUE TO THE COVID-19 PANDEMIC.

## 2019-08-23 NOTE — Patient Instructions (Signed)
Diabetes Mellitus and Foot Care Foot care is an important part of your health, especially when you have diabetes. Diabetes may cause you to have problems because of poor blood flow (circulation) to your feet and legs, which can cause your skin to:  Become thinner and drier.  Break more easily.  Heal more slowly.  Peel and crack. You may also have nerve damage (neuropathy) in your legs and feet, causing decreased feeling in them. This means that you may not notice minor injuries to your feet that could lead to more serious problems. Noticing and addressing any potential problems early is the best way to prevent future foot problems. How to care for your feet Foot hygiene  Wash your feet daily with warm water and mild soap. Do not use hot water. Then, pat your feet and the areas between your toes until they are completely dry. Do not soak your feet as this can dry your skin.  Trim your toenails straight across. Do not dig under them or around the cuticle. File the edges of your nails with an emery board or nail file.  Apply a moisturizing lotion or petroleum jelly to the skin on your feet and to dry, brittle toenails. Use lotion that does not contain alcohol and is unscented. Do not apply lotion between your toes. Shoes and socks  Wear clean socks or stockings every day. Make sure they are not too tight. Do not wear knee-high stockings since they may decrease blood flow to your legs.  Wear shoes that fit properly and have enough cushioning. Always look in your shoes before you put them on to be sure there are no objects inside.  To break in new shoes, wear them for just a few hours a day. This prevents injuries on your feet. Wounds, scrapes, corns, and calluses  Check your feet daily for blisters, cuts, bruises, sores, and redness. If you cannot see the bottom of your feet, use a mirror or ask someone for help.  Do not cut corns or calluses or try to remove them with medicine.  If you  find a minor scrape, cut, or break in the skin on your feet, keep it and the skin around it clean and dry. You may clean these areas with mild soap and water. Do not clean the area with peroxide, alcohol, or iodine.  If you have a wound, scrape, corn, or callus on your foot, look at it several times a day to make sure it is healing and not infected. Check for: ? Redness, swelling, or pain. ? Fluid or blood. ? Warmth. ? Pus or a bad smell. General instructions  Do not cross your legs. This may decrease blood flow to your feet.  Do not use heating pads or hot water bottles on your feet. They may burn your skin. If you have lost feeling in your feet or legs, you may not know this is happening until it is too late.  Protect your feet from hot and cold by wearing shoes, such as at the beach or on hot pavement.  Schedule a complete foot exam at least once a year (annually) or more often if you have foot problems. If you have foot problems, report any cuts, sores, or bruises to your health care provider immediately. Contact a health care provider if:  You have a medical condition that increases your risk of infection and you have any cuts, sores, or bruises on your feet.  You have an injury that is not   healing.  You have redness on your legs or feet.  You feel burning or tingling in your legs or feet.  You have pain or cramps in your legs and feet.  Your legs or feet are numb.  Your feet always feel cold.  You have pain around a toenail. Get help right away if:  You have a wound, scrape, corn, or callus on your foot and: ? You have pain, swelling, or redness that gets worse. ? You have fluid or blood coming from the wound, scrape, corn, or callus. ? Your wound, scrape, corn, or callus feels warm to the touch. ? You have pus or a bad smell coming from the wound, scrape, corn, or callus. ? You have a fever. ? You have a red line going up your leg. Summary  Check your feet every day  for cuts, sores, red spots, swelling, and blisters.  Moisturize feet and legs daily.  Wear shoes that fit properly and have enough cushioning.  If you have foot problems, report any cuts, sores, or bruises to your health care provider immediately.  Schedule a complete foot exam at least once a year (annually) or more often if you have foot problems. This information is not intended to replace advice given to you by your health care provider. Make sure you discuss any questions you have with your health care provider. Document Revised: 02/24/2019 Document Reviewed: 07/05/2016 Elsevier Patient Education  2020 [employer]  

## 2019-08-24 LAB — LIPID PANEL
Chol/HDL Ratio: 3.1 ratio (ref 0.0–4.4)
Cholesterol, Total: 123 mg/dL (ref 100–199)
HDL: 40 mg/dL (ref >39)
LDL Chol Calc (NIH): 65 mg/dL (ref 0–99)
Triglycerides: 96 mg/dL (ref 0–149)
VLDL Cholesterol Cal: 18 mg/dL (ref 5–40)

## 2019-08-24 LAB — HEMOGLOBIN A1C
Est. average glucose Bld gHb Est-mCnc: 114 mg/dL
Hgb A1c MFr Bld: 5.6 % (ref 4.8–5.6)

## 2019-08-24 LAB — BMP8+EGFR
BUN/Creatinine Ratio: 29 — ABNORMAL HIGH (ref 12–28)
BUN: 29 mg/dL — ABNORMAL HIGH (ref 8–27)
CO2: 22 mmol/L (ref 20–29)
Calcium: 9.6 mg/dL (ref 8.7–10.3)
Chloride: 103 mmol/L (ref 96–106)
Creatinine, Ser: 0.99 mg/dL (ref 0.57–1.00)
GFR calc Af Amer: 69 mL/min/{1.73_m2} (ref >59)
GFR calc non Af Amer: 60 mL/min/{1.73_m2} (ref >59)
Glucose: 106 mg/dL — ABNORMAL HIGH (ref 65–99)
Potassium: 4.4 mmol/L (ref 3.5–5.2)
Sodium: 140 mmol/L (ref 134–144)

## 2019-09-08 ENCOUNTER — Ambulatory Visit
Admission: RE | Admit: 2019-09-08 | Discharge: 2019-09-08 | Disposition: A | Discharge status: Home / Self Care | Payer: Medicare Other | Source: Ambulatory Visit | Attending: Internal Medicine | Admitting: Internal Medicine

## 2019-09-08 ENCOUNTER — Other Ambulatory Visit: Payer: Self-pay

## 2019-09-08 DIAGNOSIS — Z1231 Encounter for screening mammogram for malignant neoplasm of breast: Secondary | ICD-10-CM

## 2019-11-02 ENCOUNTER — Ambulatory Visit
Admission: RE | Admit: 2019-11-02 | Discharge: 2019-11-02 | Disposition: A | Discharge status: Home / Self Care | Payer: Medicare Other | Source: Ambulatory Visit | Attending: Internal Medicine | Admitting: Internal Medicine

## 2019-11-02 ENCOUNTER — Other Ambulatory Visit: Payer: Self-pay

## 2019-11-02 DIAGNOSIS — M810 Age-related osteoporosis without current pathological fracture: Secondary | ICD-10-CM

## 2019-11-02 DIAGNOSIS — E2839 Other primary ovarian failure: Secondary | ICD-10-CM

## 2019-11-02 DIAGNOSIS — Z780 Asymptomatic menopausal state: Secondary | ICD-10-CM | POA: Diagnosis not present

## 2019-11-04 ENCOUNTER — Telehealth: Payer: Self-pay

## 2019-11-04 NOTE — Telephone Encounter (Signed)
I left a message for the pt to call the office back to remind the pt that her referral to rheumatology was for treatment of osteoporosis and not for a bone density scan.

## 2019-11-09 ENCOUNTER — Telehealth: Payer: Self-pay

## 2019-11-09 NOTE — Telephone Encounter (Signed)
I returned the pt's sister's call Huu Aranas and gave her the number to call back Dr. Arlean Hopping office back to schedule an appt for treatment of osteoporosis.

## 2019-12-21 ENCOUNTER — Encounter: Payer: Medicare Other | Admitting: Internal Medicine

## 2019-12-21 ENCOUNTER — Ambulatory Visit: Payer: Medicare Other

## 2020-01-13 DIAGNOSIS — E113293 Type 2 diabetes mellitus with mild nonproliferative diabetic retinopathy without macular edema, bilateral: Secondary | ICD-10-CM | POA: Diagnosis not present

## 2020-01-19 LAB — HM DIABETES EYE EXAM

## 2020-01-20 ENCOUNTER — Other Ambulatory Visit: Payer: Self-pay

## 2020-01-20 ENCOUNTER — Ambulatory Visit (INDEPENDENT_AMBULATORY_CARE_PROVIDER_SITE_OTHER): Payer: Medicare Other | Admitting: Internal Medicine

## 2020-01-20 ENCOUNTER — Encounter: Payer: Self-pay | Admitting: Internal Medicine

## 2020-01-20 ENCOUNTER — Ambulatory Visit (INDEPENDENT_AMBULATORY_CARE_PROVIDER_SITE_OTHER): Payer: Medicare Other

## 2020-01-20 VITALS — BP 116/70 | HR 81 | Temp 97.8°F | Ht 59.8 in | Wt 127.6 lb

## 2020-01-20 VITALS — BP 116/70 | HR 81 | Temp 97.8°F | Ht 59.8 in | Wt 127.0 lb

## 2020-01-20 DIAGNOSIS — Z6825 Body mass index (BMI) 25.0-25.9, adult: Secondary | ICD-10-CM

## 2020-01-20 DIAGNOSIS — Z0000 Encounter for general adult medical examination without abnormal findings: Secondary | ICD-10-CM

## 2020-01-20 DIAGNOSIS — E7800 Pure hypercholesterolemia, unspecified: Secondary | ICD-10-CM

## 2020-01-20 DIAGNOSIS — E118 Type 2 diabetes mellitus with unspecified complications: Secondary | ICD-10-CM | POA: Diagnosis not present

## 2020-01-20 DIAGNOSIS — I10 Essential (primary) hypertension: Secondary | ICD-10-CM

## 2020-01-20 DIAGNOSIS — W101XXA Fall (on)(from) sidewalk curb, initial encounter: Secondary | ICD-10-CM

## 2020-01-20 DIAGNOSIS — E559 Vitamin D deficiency, unspecified: Secondary | ICD-10-CM

## 2020-01-20 DIAGNOSIS — D649 Anemia, unspecified: Secondary | ICD-10-CM

## 2020-01-20 DIAGNOSIS — E663 Overweight: Secondary | ICD-10-CM

## 2020-01-20 LAB — POCT UA - MICROALBUMIN
Albumin/Creatinine Ratio, Urine, POC: 30
Creatinine, POC: 100 mg/dL
Microalbumin Ur, POC: 10 mg/L

## 2020-01-20 LAB — POCT URINALYSIS DIPSTICK
Bilirubin, UA: NEGATIVE
Blood, UA: NEGATIVE
Glucose, UA: NEGATIVE
Ketones, UA: NEGATIVE
Leukocytes, UA: NEGATIVE
Nitrite, UA: NEGATIVE
Protein, UA: NEGATIVE
Spec Grav, UA: 1.015 (ref 1.010–1.025)
Urobilinogen, UA: 0.2 E.U./dL
pH, UA: 6 (ref 5.0–8.0)

## 2020-01-20 MED ORDER — AGAMATRIX PRESTO TEST VI STRP: ORAL_STRIP | 3 refills | Status: DC

## 2020-01-20 NOTE — Patient Instructions (Addendum)
LIDOCAINE PATCH - GET OVER THE COUNTER APPLY TO AFFECTED AREA DAILY AS NEEDED   Health Maintenance After Age 67 After age 58, you are at a higher risk for certain long-term diseases and infections as well as injuries from falls. Falls are a major cause of broken bones and head injuries in people who are older than age 96. Getting regular preventive care can help to keep you healthy and well. Preventive care includes getting regular testing and making lifestyle changes as recommended by your health care provider. Talk with your health care provider about:  Which screenings and tests you should have. A screening is a test that checks for a disease when you have no symptoms.  A diet and exercise plan that is right for you. What should I know about screenings and tests to prevent falls? Screening and testing are the best ways to find a health problem early. Early diagnosis and treatment give you the best chance of managing medical conditions that are common after age 19. Certain conditions and lifestyle choices may make you more likely to have a fall. Your health care provider may recommend:  Regular vision checks. Poor vision and conditions such as cataracts can make you more likely to have a fall. If you wear glasses, make sure to get your prescription updated if your vision changes.  Medicine review. Work with your health care provider to regularly review all of the medicines you are taking, including over-the-counter medicines. Ask your health care provider about any side effects that may make you more likely to have a fall. Tell your health care provider if any medicines that you take make you feel dizzy or sleepy.  Osteoporosis screening. Osteoporosis is a condition that causes the bones to get weaker. This can make the bones weak and cause them to break more easily.  Blood pressure screening. Blood pressure changes and medicines to control blood pressure can make you feel dizzy.  Strength and  balance checks. Your health care provider may recommend certain tests to check your strength and balance while standing, walking, or changing positions.  Foot health exam. Foot pain and numbness, as well as not wearing proper footwear, can make you more likely to have a fall.  Depression screening. You may be more likely to have a fall if you have a fear of falling, feel emotionally low, or feel unable to do activities that you used to do.  Alcohol use screening. Using too much alcohol can affect your balance and may make you more likely to have a fall. What actions can I take to lower my risk of falls? General instructions  Talk with your health care provider about your risks for falling. Tell your health care provider if: ? You fall. Be sure to tell your health care provider about all falls, even ones that seem minor. ? You feel dizzy, sleepy, or off-balance.  Take over-the-counter and prescription medicines only as told by your health care provider. These include any supplements.  Eat a healthy diet and maintain a healthy weight. A healthy diet includes low-fat dairy products, low-fat (lean) meats, and fiber from whole grains, beans, and lots of fruits and vegetables. Home safety  Remove any tripping hazards, such as rugs, cords, and clutter.  Install safety equipment such as grab bars in bathrooms and safety rails on stairs.  Keep rooms and walkways well-lit. Activity   Follow a regular exercise program to stay fit. This will help you maintain your balance. Ask your health care  provider what types of exercise are appropriate for you.  If you need a cane or walker, use it as recommended by your health care provider.  Wear supportive shoes that have nonskid soles. Lifestyle  Do not drink alcohol if your health care provider tells you not to drink.  If you drink alcohol, limit how much you have: ? 0-1 drink a day for women. ? 0-2 drinks a day for men.  Be aware of how much  alcohol is in your drink. In the [place] one drink equals one typical bottle of beer (12 oz), one-half glass of wine (5 oz), or one shot of hard liquor (1 oz).  Do not use any products that contain nicotine or tobacco, such as cigarettes and e-cigarettes. If you need help quitting, ask your health care provider. Summary  Having a healthy lifestyle and getting preventive care can help to protect your health and wellness after age 47.  Screening and testing are the best way to find a health problem early and help you avoid having a fall. Early diagnosis and treatment give you the best chance for managing medical conditions that are more common for people who are older than age 29.  Falls are a major cause of broken bones and head injuries in people who are older than age 19. Take precautions to prevent a fall at home.  Work with your health care provider to learn what changes you can make to improve your health and wellness and to prevent falls. This information is not intended to replace advice given to you by your health care provider. Make sure you discuss any questions you have with your health care provider. Document Revised: 09/24/2018 Document Reviewed: 04/16/2017 Elsevier Patient Education  2020 [employer].

## 2020-01-20 NOTE — Progress Notes (Signed)
I,Katawbba Wiggins,acting as a Education administrator for [name], MD.,have documented all relevant documentation on the behalf of [name], MD,as directed by  [name], MD while in the presence of [name], MD.  This visit occurred during the SARS-CoV-2 public health emergency.  Safety protocols were in place, including screening questions prior to the visit, additional usage of staff PPE, and extensive cleaning of exam room while observing appropriate contact time as indicated for disinfecting solutions.  Subjective:     Patient ID: Cindy Ross , female    DOB: 05/03/1951 , 69 y.o.   MRN: [id number]   Chief Complaint  Patient presents with  . Annual Exam  . Hypertension  . Diabetes    HPI  The patient is today for a physical examination. She is no longer followed by GYN. She has no specific concerns or complaints at this time. She does not wish to have a pelvic exam today.    Hypertension This is a chronic problem. The current episode started more than 1 year ago. The problem has been gradually improving since onset. The problem is controlled. Pertinent negatives include no blurred vision or palpitations. Risk factors for coronary artery disease include diabetes mellitus, dyslipidemia and post-menopausal state. The current treatment provides moderate improvement.  Diabetes She presents for her follow-up diabetic visit. She has type 2 diabetes mellitus. There are no hypoglycemic associated symptoms. Pertinent negatives for diabetes include no blurred vision. There are no hypoglycemic complications. She is compliant with treatment most of the time. She is following a diabetic diet. When asked about meal planning, she reported none. She participates in exercise intermittently. Her home blood glucose trend is fluctuating minimally. An ACE inhibitor/angiotensin II receptor blocker is being taken. Eye exam is not current.     Past Medical History:  Diagnosis Date  . Diabetes  mellitus without complication ([place])   . Hyperlipidemia 01/30/2016  . Hypertension 2007  . Respiratory crackles at left lung base 05/19/2017   CXR 02/2017 in [facility] normal     Family History  Problem Relation Age of Onset  . Hyperlipidemia Sister   . Diabetes Sister   . Hypertension Sister   . Heart disease Brother   . Liver disease Brother      Current Outpatient Medications:  .  aspirin EC 81 MG tablet, Take 1 tablet (81 mg total) by mouth daily., Disp: , Rfl:  .  atorvastatin (LIPITOR) 20 MG tablet, Take 1 tablet (20 mg total) by mouth daily., Disp: 90 tablet, Rfl: 2 .  cholecalciferol (VITAMIN D) 1000 units tablet, Take 1,000 Units by mouth daily., Disp: , Rfl:  .  metFORMIN (GLUCOPHAGE) 1000 MG tablet, Take 1 tablet (1,000 mg total) by mouth 2 (two) times daily., Disp: 180 tablet, Rfl: 2 .  Multiple Vitamins-Minerals (ONE-A-DAY WOMENS 50+ ADVANTAGE PO), Take 1 tablet by mouth daily at 12 noon., Disp: , Rfl:  .  olmesartan-hydrochlorothiazide (BENICAR HCT) 40-12.5 MG tablet, Take 1 tablet by mouth daily., Disp: , Rfl:  .  glucose blood (AGAMATRIX PRESTO TEST) test strip, Use as directed to check blood sugars 2 times per day dx:e11.22, Disp: 100 each, Rfl: 3   No Known Allergies    The patient states she uses post menopausal status for birth control. Last LMP was No LMP recorded. Patient is postmenopausal.. Negative for Dysmenorrhea. Negative for: breast discharge, breast lump(s), breast pain and breast self exam. Associated symptoms include abnormal vaginal bleeding. Pertinent negatives include abnormal bleeding (  hematology), anxiety, decreased libido, depression, difficulty falling sleep, dyspareunia, history of infertility, nocturia, sexual dysfunction, sleep disturbances, urinary incontinence, urinary urgency, vaginal discharge and vaginal itching. Diet regular.The patient states her exercise level is  intermittent.   . The patient's tobacco use is:  Social History    Tobacco Use  Smoking Status Never Smoker  Smokeless Tobacco Never Used  . She has been exposed to passive smoke. The patient's alcohol use is:  Social History   Substance and Sexual Activity  Alcohol Use No  . Alcohol/week: 0.0 standard drinks     Review of Systems  Constitutional: Negative.   HENT: Negative.   Eyes: Negative.  Negative for blurred vision.  Respiratory: Negative.   Cardiovascular: Negative.  Negative for palpitations.  Gastrointestinal: Negative.   Endocrine: Negative.   Genitourinary: Negative.   Musculoskeletal: Negative.        The patient is having some left side pain and bilateral knee pain because of a fall she had a week ago  Skin: Negative.   Allergic/Immunologic: Negative.   Neurological: Negative.   Hematological: Negative.   Psychiatric/Behavioral: Negative.      Today's Vitals   01/20/20 1142  BP: 116/70  Pulse: 81  Temp: 97.8 F (36.6 C)  TempSrc: Oral  Weight: 127 lb 9.6 oz (57.9 kg)  Height: 4' 11.8" (1.519 m)  PainSc: 1   PainLoc: Back   Body mass index is 25.09 kg/m.  Wt Readings from Last 3 Encounters:  01/20/20 127 lb (57.6 kg)  01/20/20 127 lb 9.6 oz (57.9 kg)  08/23/19 127 lb 3.2 oz (57.7 kg)   Objective:  Physical Exam Constitutional:      General: She is not in acute distress.    Appearance: Normal appearance. She is well-developed. She is obese.  HENT:     Head: Normocephalic and atraumatic.     Right Ear: Hearing, tympanic membrane, ear canal and external ear normal. There is no impacted cerumen.     Left Ear: Hearing, tympanic membrane, ear canal and external ear normal. There is no impacted cerumen.     Nose: Nose normal.     Mouth/Throat:     Mouth: Mucous membranes are dry.  Eyes:     General: Lids are normal.     Extraocular Movements: Extraocular movements intact.     Conjunctiva/sclera: Conjunctivae normal.     Pupils: Pupils are equal, round, and reactive to light.     Funduscopic exam:    Right  eye: No papilledema.        Left eye: No papilledema.  Neck:     Thyroid: No thyroid mass.     Vascular: No carotid bruit.  Cardiovascular:     Rate and Rhythm: Normal rate and regular rhythm.     Pulses: Normal pulses.          Dorsalis pedis pulses are 2+ on the right side and 2+ on the left side.     Heart sounds: Normal heart sounds. No murmur heard.   Pulmonary:     Effort: Pulmonary effort is normal.     Breath sounds: Normal breath sounds.  Chest:     Breasts:        Right: Normal.        Left: Normal.  Abdominal:     General: Abdomen is flat. Bowel sounds are normal. There is no distension.     Palpations: Abdomen is soft.     Tenderness: There is no abdominal tenderness.  Genitourinary:  Rectum: Guaiac result negative.  Musculoskeletal:        General: No swelling. Normal range of motion.     Cervical back: Full passive range of motion without pain, normal range of motion and neck supple.     Right lower leg: No edema.     Left lower leg: No edema.  Feet:     Right foot:     Protective Sensation: 5 sites tested. 5 sites sensed.     Skin integrity: Dry skin present.     Toenail Condition: Right toenails are normal.     Left foot:     Protective Sensation: 5 sites tested. 5 sites sensed.     Skin integrity: Dry skin present.     Toenail Condition: Left toenails are normal.  Skin:    General: Skin is warm and dry.     Capillary Refill: Capillary refill takes less than 2 seconds.     Comments: Abrasion on r forehead. No surrounding erythema.   Neurological:     General: No focal deficit present.     Mental Status: She is alert and oriented to person, place, and time.     Cranial Nerves: No cranial nerve deficit.     Sensory: No sensory deficit.  Psychiatric:        Mood and Affect: Mood normal.        Behavior: Behavior normal.        Thought Content: Thought content normal.        Judgment: Judgment normal.         Assessment And Plan:     1. Routine  general medical examination at a health care facility Comments: A full exam was performed. Importance of monthly self breast exams was performed. PATIENT IS ADVISED TO GET 30-45 MINUTES REGULAR EXERCISE NO LESS THAN FOUR TO FIVE DAYS PER WEEK - BOTH WEIGHTBEARING EXERCISES AND AEROBIC ARE RECOMMENDED.  PATIENT IS ADVISED TO FOLLOW A HEALTHY DIET WITH AT LEAST SIX FRUITS/VEGGIES PER DAY, DECREASE INTAKE OF RED MEAT, AND TO INCREASE FISH INTAKE TO TWO DAYS PER WEEK.  MEATS/FISH SHOULD NOT BE FRIED, BAKED OR BROILED IS PREFERABLE.  I SUGGEST WEARING SPF 50 SUNSCREEN ON EXPOSED PARTS AND ESPECIALLY WHEN IN THE DIRECT SUNLIGHT FOR AN EXTENDED PERIOD OF TIME.  PLEASE AVOID FAST FOOD RESTAURANTS AND INCREASE YOUR WATER INTAKE.    2. Essential hypertension Comments: Chronic, well controlled. She will continue with current meds. She is encouraged to avoid adding salt to her foods. EKG performed, NSR w/o acute changes. She will rto in six months for re-evaluation.  - EKG 12-Lead  3. Type 2 diabetes mellitus with complication, without long-term current use of insulin (HCC) Comments: Diabetic foot exam was performed. I DISCUSSED WITH THE PATIENT AT LENGTH REGARDING THE GOALS OF GLYCEMIC CONTROL AND POSSIBLE LONG-TERM COMPLICATIONS.  I  ALSO STRESSED THE IMPORTANCE OF COMPLIANCE WITH HOME GLUCOSE MONITORING, DIETARY RESTRICTIONS INCLUDING AVOIDANCE OF SUGARY DRINKS/PROCESSED FOODS,  ALONG WITH REGULAR EXERCISE.  I  ALSO STRESSED THE IMPORTANCE OF ANNUAL EYE EXAMS, SELF FOOT CARE AND COMPLIANCE WITH OFFICE VISITS.  - POCT Urinalysis Dipstick (81002) - POCT UA - Microalbumin - Hemoglobin A1c - CBC - CMP14+EGFR  4. Pure hypercholesterolemia  Chronic. Importance of medication and dietary compliance was discussed with the patient. She was encouraged to avoid fried foods and to incorporate more exercise into her daily routine.   5. Anemia, unspecified type Comments: Previous Hb 10. I will recheck CBC and iron  levels  today. She denies rectal and gingival bleeding.  - CBC - Iron and IBC ([id number]) - TSH  6. Vitamin D deficiency disease  I WILL CHECK A VIT D LEVEL AND SUPPLEMENT AS NEEDED.  ALSO ENCOURAGED TO SPEND 15 MINUTES IN THE SUN DAILY.  - VITAMIN D 25 Hydroxy (Vit-D Deficiency, Fractures)  7. Overweight with body mass index (BMI) of 25 to 25.9 in adult  Her BMI is acceptable for her demographic. She is advised to aim for at least 150 minutes of exercise per week.  8. Fall involving sidewalk curb, initial encounter  Occurred on 7/29 while leaving another MD office. She reports she missed the curb. This did result in injuries. She is advised to watch her step when leaving all MD offices.     Patient was given opportunity to ask questions. Patient verbalized understanding of the plan and was able to repeat key elements of the plan. All questions were answered to their satisfaction.   [name], MD   I, [name], MD, have reviewed all documentation for this visit. The documentation on 01/29/20 for the exam, diagnosis, procedures, and orders are all accurate and complete.  THE PATIENT IS ENCOURAGED TO PRACTICE SOCIAL DISTANCING DUE TO THE COVID-19 PANDEMIC.

## 2020-01-20 NOTE — Patient Instructions (Signed)
Cindy Ross , Thank you for taking time to come for your Medicare Wellness Visit. I appreciate your ongoing commitment to your health goals. Please review the following plan we discussed and let me know if I can assist you in the future.   Screening recommendations/referrals: Colonoscopy: completed 02/23/2018 Mammogram: completed 09/08/2019 Bone Density: completed 11/02/2019 Recommended yearly ophthalmology/optometry visit for glaucoma screening and checkup Recommended yearly dental visit for hygiene and checkup  Vaccinations: Influenza vaccine: due Pneumococcal vaccine: completed 12/29/2018 Tdap vaccine: completed 01/30/2016 Shingles vaccine: completed   Covid-19:completed 08/13/2019, 07/23/2019  Advanced directives: Advance directive discussed with you today. Even though you declined this today please call our office should you change your mind and we can give you the proper paperwork for you to fill out.   Conditions/risks identified: none  Next appointment: Follow up in one year for your annual wellness visit 02/01/2021 at 8:30   Preventive Care 65 Years and Older, Female Preventive care refers to lifestyle choices and visits with your health care provider that can promote health and wellness. What does preventive care include?  A yearly physical exam. This is also called an annual well check.  Dental exams once or twice a year.  Routine eye exams. Ask your health care provider how often you should have your eyes checked.  Personal lifestyle choices, including:  Daily care of your teeth and gums.  Regular physical activity.  Eating a healthy diet.  Avoiding tobacco and drug use.  Limiting alcohol use.  Practicing safe sex.  Taking low-dose aspirin every day.  Taking vitamin and mineral supplements as recommended by your health care provider. What happens during an annual well check? The services and screenings done by your health care provider during your annual well check  will depend on your age, overall health, lifestyle risk factors, and family history of disease. Counseling  Your health care provider may ask you questions about your:  Alcohol use.  Tobacco use.  Drug use.  Emotional well-being.  Home and relationship well-being.  Sexual activity.  Eating habits.  History of falls.  Memory and ability to understand (cognition).  Work and work Statistician.  Reproductive health. Screening  You may have the following tests or measurements:  Height, weight, and BMI.  Blood pressure.  Lipid and cholesterol levels. These may be checked every 5 years, or more frequently if you are over 18 years old.  Skin check.  Lung cancer screening. You may have this screening every year starting at age 52 if you have a 30-pack-year history of smoking and currently smoke or have quit within the past 15 years.  Fecal occult blood test (FOBT) of the stool. You may have this test every year starting at age 55.  Flexible sigmoidoscopy or colonoscopy. You may have a sigmoidoscopy every 5 years or a colonoscopy every 10 years starting at age 59.  Hepatitis C blood test.  Hepatitis B blood test.  Sexually transmitted disease (STD) testing.  Diabetes screening. This is done by checking your blood sugar (glucose) after you have not eaten for a while (fasting). You may have this done every 1-3 years.  Bone density scan. This is done to screen for osteoporosis. You may have this done starting at age 11.  Mammogram. This may be done every 1-2 years. Talk to your health care provider about how often you should have regular mammograms. Talk with your health care provider about your test results, treatment options, and if necessary, the need for more tests. Vaccines  Your health care provider may recommend certain vaccines, such as:  Influenza vaccine. This is recommended every year.  Tetanus, diphtheria, and acellular pertussis (Tdap, Td) vaccine. You may  need a Td booster every 10 years.  Zoster vaccine. You may need this after age 58.  Pneumococcal 13-valent conjugate (PCV13) vaccine. One dose is recommended after age 26.  Pneumococcal polysaccharide (PPSV23) vaccine. One dose is recommended after age 40. Talk to your health care provider about which screenings and vaccines you need and how often you need them. This information is not intended to replace advice given to you by your health care provider. Make sure you discuss any questions you have with your health care provider. Document Released: 06/30/2015 Document Revised: 02/21/2016 Document Reviewed: 04/04/2015 Elsevier Interactive Patient Education  2017 [place] Prevention in the Home Falls can cause injuries. They can happen to people of all ages. There are many things you can do to make your home safe and to help prevent falls. What can I do on the outside of my home?  Regularly fix the edges of walkways and driveways and fix any cracks.  Remove anything that might make you trip as you walk through a door, such as a raised step or threshold.  Trim any bushes or trees on the path to your home.  Use bright outdoor lighting.  Clear any walking paths of anything that might make someone trip, such as rocks or tools.  Regularly check to see if handrails are loose or broken. Make sure that both sides of any steps have handrails.  Any raised decks and porches should have guardrails on the edges.  Have any leaves, snow, or ice cleared regularly.  Use sand or salt on walking paths during winter.  Clean up any spills in your garage right away. This includes oil or grease spills. What can I do in the bathroom?  Use night lights.  Install grab bars by the toilet and in the tub and shower. Do not use towel bars as grab bars.  Use non-skid mats or decals in the tub or shower.  If you need to sit down in the shower, use a plastic, non-slip stool.  Keep the floor  dry. Clean up any water that spills on the floor as soon as it happens.  Remove soap buildup in the tub or shower regularly.  Attach bath mats securely with double-sided non-slip rug tape.  Do not have throw rugs and other things on the floor that can make you trip. What can I do in the bedroom?  Use night lights.  Make sure that you have a light by your bed that is easy to reach.  Do not use any sheets or blankets that are too big for your bed. They should not hang down onto the floor.  Have a firm chair that has side arms. You can use this for support while you get dressed.  Do not have throw rugs and other things on the floor that can make you trip. What can I do in the kitchen?  Clean up any spills right away.  Avoid walking on wet floors.  Keep items that you use a lot in easy-to-reach places.  If you need to reach something above you, use a strong step stool that has a grab bar.  Keep electrical cords out of the way.  Do not use floor polish or wax that makes floors slippery. If you must use wax, use non-skid floor wax.  Do  not have throw rugs and other things on the floor that can make you trip. What can I do with my stairs?  Do not leave any items on the stairs.  Make sure that there are handrails on both sides of the stairs and use them. Fix handrails that are broken or loose. Make sure that handrails are as long as the stairways.  Check any carpeting to make sure that it is firmly attached to the stairs. Fix any carpet that is loose or worn.  Avoid having throw rugs at the top or bottom of the stairs. If you do have throw rugs, attach them to the floor with carpet tape.  Make sure that you have a light switch at the top of the stairs and the bottom of the stairs. If you do not have them, ask someone to add them for you. What else can I do to help prevent falls?  Wear shoes that:  Do not have high heels.  Have rubber bottoms.  Are comfortable and fit you  well.  Are closed at the toe. Do not wear sandals.  If you use a stepladder:  Make sure that it is fully opened. Do not climb a closed stepladder.  Make sure that both sides of the stepladder are locked into place.  Ask someone to hold it for you, if possible.  Clearly mark and make sure that you can see:  Any grab bars or handrails.  First and last steps.  Where the edge of each step is.  Use tools that help you move around (mobility aids) if they are needed. These include:  Canes.  Walkers.  Scooters.  Crutches.  Turn on the lights when you go into a dark area. Replace any light bulbs as soon as they burn out.  Set up your furniture so you have a clear path. Avoid moving your furniture around.  If any of your floors are uneven, fix them.  If there are any pets around you, be aware of where they are.  Review your medicines with your doctor. Some medicines can make you feel dizzy. This can increase your chance of falling. Ask your doctor what other things that you can do to help prevent falls. This information is not intended to replace advice given to you by your health care provider. Make sure you discuss any questions you have with your health care provider. Document Released: 03/30/2009 Document Revised: 11/09/2015 Document Reviewed: 07/08/2014 Elsevier Interactive Patient Education  2017 [employer].

## 2020-01-20 NOTE — Progress Notes (Signed)
This visit occurred during the SARS-CoV-2 public health emergency.  Safety protocols were in place, including screening questions prior to the visit, additional usage of staff PPE, and extensive cleaning of exam room while observing appropriate contact time as indicated for disinfecting solutions.  Subjective:   Cindy Ross is a 69 y.o. female who presents for Medicare Annual (Subsequent) preventive examination.  Review of Systems     Cardiac Risk Factors include: advanced age (>31men, >50 women);diabetes mellitus;dyslipidemia;hypertension     Objective:    Today's Vitals   01/20/20 1213  BP: 116/70  Pulse: 81  Temp: 97.8 F (36.6 C)  TempSrc: Oral  Weight: 127 lb (57.6 kg)  Height: 4' 11.8" (1.519 m)   Body mass index is 24.97 kg/m.  Advanced Directives 01/20/2020 12/15/2018 11/18/2016  Does Patient Have a Medical Advance Directive? No No No  Would patient like information on creating a medical advance directive? No - Patient declined Yes (MAU/Ambulatory/Procedural Areas - Information given) Yes (MAU/Ambulatory/Procedural Areas - Information given)    Current Medications (verified) Outpatient Encounter Medications as of 01/20/2020  Medication Sig  . aspirin EC 81 MG tablet Take 1 tablet (81 mg total) by mouth daily.  Marland Kitchen atorvastatin (LIPITOR) 20 MG tablet Take 1 tablet (20 mg total) by mouth daily.  . cholecalciferol (VITAMIN D) 1000 units tablet Take 1,000 Units by mouth daily.  . metFORMIN (GLUCOPHAGE) 1000 MG tablet Take 1 tablet (1,000 mg total) by mouth 2 (two) times daily.  . Multiple Vitamins-Minerals (ONE-A-DAY WOMENS 50+ ADVANTAGE PO) Take 1 tablet by mouth daily at 12 noon.   No facility-administered encounter medications on file as of 01/20/2020.    Allergies (verified) Patient has no known allergies.   History: Past Medical History:  Diagnosis Date  . Diabetes mellitus without complication ([place])   . Hyperlipidemia 01/30/2016  . Hypertension 2007  . Respiratory  crackles at left lung base 05/19/2017   CXR 02/2017 in [facility] normal   History reviewed. No pertinent surgical history. Family History  Problem Relation Age of Onset  . Hyperlipidemia Sister   . Diabetes Sister   . Hypertension Sister   . Heart disease Brother   . Liver disease Brother    Social History   Socioeconomic History  . Marital status: Widowed    Spouse name: Not on file  . Number of children: 1  . Years of education: 55  . Highest education level: Not on file  Occupational History  . Occupation: house cleaning.    Comment: Worked for bakery in past  . Occupation: retired  Tobacco Use  . Smoking status: Never Smoker  . Smokeless tobacco: Never Used  Vaping Use  . Vaping Use: Never used  Substance and Sexual Activity  . Alcohol use: No    Alcohol/week: 0.0 standard drinks  . Drug use: No  . Sexual activity: Not Currently  Other Topics Concern  . Not on file  Social History Narrative   Originally from [place]   Came to [place]. In 49   Lives with younger sister, Cyrah Mclamb   Son lives in [place].  Works in [employer].     Social Determinants of Health   Financial Resource Strain: Low Risk   . Difficulty of Paying Living Expenses: Not hard at all  Food Insecurity: No Food Insecurity  . Worried About Charity fundraiser in the Last Year: Never true  . Ran Out of Food in the Last Year: Never true  Transportation Needs:  No Transportation Needs  . Lack of Transportation (Medical): No  . Lack of Transportation (Non-Medical): No  Physical Activity: Sufficiently Active  . Days of Exercise per Week: 7 days  . Minutes of Exercise per Session: 30 min  Stress: No Stress Concern Present  . Feeling of Stress : Not at all  Social Connections:   . Frequency of Communication with Friends and Family:   . Frequency of Social Gatherings with Friends and Family:   . Attends Religious Services:   . Active Member of Clubs or Organizations:   . Attends  Archivist Meetings:   Marland Kitchen Marital Status:     Tobacco Counseling Counseling given: Not Answered   Clinical Intake:  Pre-visit preparation completed: Yes  Pain : 0-10 Pain Location: Back Pain Orientation: Right Pain Descriptors / Indicators: Aching Pain Onset: In the past 7 days     Nutritional Status: BMI of 19-24  Normal Nutritional Risks: None Diabetes: No  What is the last grade level you completed in school?: high school  Diabetic? Yes Nutrition Risk Assessment:  Has the patient had any N/V/D within the last 2 months?  No  Does the patient have any non-healing wounds?  No  Has the patient had any unintentional weight loss or weight gain?  No   Diabetes:  Is the patient diabetic?  Yes  If diabetic, was a CBG obtained today?  No  Did the patient bring in their glucometer from home?  No  How often do you monitor your CBG's? 2 times weekly.   Financial Strains and Diabetes Management:  Are you having any financial strains with the device, your supplies or your medication? No .  Does the patient want to be seen by Chronic Care Management for management of their diabetes?  No  Would the patient like to be referred to a Nutritionist or for Diabetic Management?  No   Diabetic Exams:  Diabetic Eye Exam: Completed 07/15/2019 Diabetic Foot Exam: Completed today   Interpreter Needed?: No  Information entered by :: NAllen LPN   Activities of Daily Living In your present state of health, do you have any difficulty performing the following activities: 01/20/2020 01/20/2020  Hearing? N N  Vision? N N  Difficulty concentrating or making decisions? N N  Walking or climbing stairs? N Y  Dressing or bathing? N N  Doing errands, shopping? N N  Preparing Food and eating ? N -  Using the Toilet? N -  In the past six months, have you accidently leaked urine? N -  Do you have problems with loss of bowel control? N -  Managing your Medications? N -  Managing your  Finances? N -  Housekeeping or managing your Housekeeping? N -  Some recent data might be hidden    Patient Care Team: Glendale Chard, MD as PCP - General (Internal Medicine)  Indicate any recent Medical Services you may have received from other than [facility] providers in the past year (date may be approximate).     Assessment:   This is a routine wellness examination for Karren.  Hearing/Vision screen  Hearing Screening   125Hz  250Hz  500Hz  1000Hz  2000Hz  3000Hz  4000Hz  6000Hz  8000Hz   Right ear:           Left ear:           Vision Screening Comments: Regular eye exams, Dr. Alois Cliche  Dietary issues and exercise activities discussed: Current Exercise Habits: Home exercise routine, Type of exercise: walking, Time (Minutes):  30, Frequency (Times/Week): 7, Weekly Exercise (Minutes/Week): 210  Goals    . Patient Stated     Wants to stay healthy    . Patient Stated     01/20/2020, no goals      Depression Screen PHQ 2/9 Scores 01/20/2020 01/20/2020 12/15/2018 09/08/2018 06/02/2018 11/28/2015  PHQ - 2 Score 0 0 0 0 0 0  PHQ- 9 Score 0 - 0 - - 0    Fall Risk Fall Risk  01/20/2020 01/20/2020 05/06/2019 12/15/2018 09/08/2018  Falls in the past year? 1 1 0 0 0  Number falls in past yr: 1 0 - - -  Injury with Fall? 0 1 - - -  Comment - patient fell when she went to make a step and she doesn't know why. - - -  Risk for fall due to : History of fall(s);Medication side effect - - Medication side effect -  Follow up Falls evaluation completed;Education provided;Falls prevention discussed - - Falls evaluation completed;Education provided;Falls prevention discussed -    Any stairs in or around the home? No  If so, are there any without handrails? n/a Home free of loose throw rugs in walkways, pet beds, electrical cords, etc? Yes  Adequate lighting in your home to reduce risk of falls? Yes   ASSISTIVE DEVICES UTILIZED TO PREVENT FALLS:  Life alert? No  Use of a cane, walker or w/c? No  Grab bars in  the bathroom? No  Shower chair or bench in shower? No  Elevated toilet seat or a handicapped toilet? No   TIMED UP AND GO:  Was the test performed? No .    Gait steady and fast without use of assistive device  Cognitive Function: MMSE - Mini Mental State Exam 06/02/2018  Orientation to time 5  Orientation to Place 5  Attention/ Calculation 5  Language- name 2 objects 2  Language- repeat 1  Language- read & follow direction 1  Write a sentence 1  Copy design 1     6CIT Screen 06/02/2018  What Year? 0 points  What month? 0 points  What time? 0 points  Count back from 20 0 points  Months in reverse 0 points  Repeat phrase 0 points  Total Score 0    Immunizations Immunization History  Administered Date(s) Administered  . Influenza Inj Mdck Quad Pf 07/31/2016, 05/19/2017  . Influenza, High Dose Seasonal PF 04/30/2018, 03/30/2019  . Influenza-Unspecified 04/17/2018, 03/30/2019  . PFIZER SARS-COV-2 Vaccination 07/23/2019, 08/13/2019  . Pneumococcal Conjugate-13 09/14/2018, 12/29/2018  . Pneumococcal Polysaccharide-23 03/19/2016  . Td 10/31/1994, 04/10/1995  . Tdap 01/30/2016  . Zoster Recombinat (Shingrix) 05/06/2019, 07/12/2019    TDAP status: Up to date Flu Vaccine status: Up to date Pneumococcal vaccine status: Up to date Covid-19 vaccine status: Completed vaccines  Qualifies for Shingles Vaccine? Yes   Zostavax completed No   Shingrix Completed?: Yes  Screening Tests Health Maintenance  Topic Date Due  . INFLUENZA VACCINE  01/16/2020  . HEMOGLOBIN A1C  02/23/2020  . OPHTHALMOLOGY EXAM  07/14/2020  . FOOT EXAM  01/19/2021  . PNA vac Low Risk Adult (2 of 2 - PPSV23) 03/19/2021  . MAMMOGRAM  09/07/2021  . TETANUS/TDAP  01/29/2026  . COLONOSCOPY  02/24/2028  . DEXA SCAN  Completed  . COVID-19 Vaccine  Completed  . Hepatitis C Screening  Completed    Health Maintenance  Health Maintenance Due  Topic Date Due  . INFLUENZA VACCINE  01/16/2020     Colorectal cancer screening: Completed  02/23/2018. Repeat every 10 years Mammogram status: Completed 09/08/2019. Repeat every year Bone Density status: Completed 11/02/2019.   Lung Cancer Screening: (Low Dose CT Chest recommended if Age 19-80 years, 30 pack-year currently smoking OR have quit w/in 15years.) does not qualify.   Lung Cancer Screening Referral: no  Additional Screening:  Hepatitis C Screening: does qualify; Completed 11/18/2016  Vision Screening: Recommended annual ophthalmology exams for early detection of glaucoma and other disorders of the eye. Is the patient up to date with their annual eye exam?  Yes  Who is the provider or what is the name of the office in which the patient attends annual eye exams? Dr. Idolina Primer If pt is not established with a provider, would they like to be referred to a provider to establish care? No .   Dental Screening: Recommended annual dental exams for proper oral hygiene  Community Resource Referral / Chronic Care Management: CRR required this visit?  No   CCM required this visit?  No      Plan:     I have personally reviewed and noted the following in the patient's chart:   . Medical and social history . Use of alcohol, tobacco or illicit drugs  . Current medications and supplements . Functional ability and status . Nutritional status . Physical activity . Advanced directives . List of other physicians . Hospitalizations, surgeries, and ER visits in previous 12 months . Vitals . Screenings to include cognitive, depression, and falls . Referrals and appointments  In addition, I have reviewed and discussed with patient certain preventive protocols, quality metrics, and best practice recommendations. A written personalized care plan for preventive services as well as general preventive health recommendations were provided to patient.     Kellie Simmering, LPN   06/17/1733   Nurse Notes: 6 CIT not performed due to language  barrier.

## 2020-01-21 LAB — CBC
Hematocrit: 31.4 % — ABNORMAL LOW (ref 34.0–46.6)
Hemoglobin: 10.3 g/dL — ABNORMAL LOW (ref 11.1–15.9)
MCH: 30.7 pg (ref 26.6–33.0)
MCHC: 32.8 g/dL (ref 31.5–35.7)
MCV: 94 fL (ref 79–97)
Platelets: 207 10*3/uL (ref 150–450)
RBC: 3.36 x10E6/uL — ABNORMAL LOW (ref 3.77–5.28)
RDW: 12.1 % (ref 11.7–15.4)
WBC: 5 10*3/uL (ref 3.4–10.8)

## 2020-01-21 LAB — HEMOGLOBIN A1C
Est. average glucose Bld gHb Est-mCnc: 126 mg/dL
Hgb A1c MFr Bld: 6 % — ABNORMAL HIGH (ref 4.8–5.6)

## 2020-01-21 LAB — IRON AND TIBC
Iron Saturation: 19 % (ref 15–55)
Iron: 53 ug/dL (ref 27–139)
Total Iron Binding Capacity: 281 ug/dL (ref 250–450)
UIBC: 228 ug/dL (ref 118–369)

## 2020-01-21 LAB — CMP14+EGFR
ALT: 16 IU/L (ref 0–32)
AST: 18 IU/L (ref 0–40)
Albumin/Globulin Ratio: 1.4 (ref 1.2–2.2)
Albumin: 4.6 g/dL (ref 3.8–4.8)
Alkaline Phosphatase: 82 IU/L (ref 48–121)
BUN/Creatinine Ratio: 22 (ref 12–28)
BUN: 21 mg/dL (ref 8–27)
Bilirubin Total: 0.4 mg/dL (ref 0.0–1.2)
CO2: 25 mmol/L (ref 20–29)
Calcium: 10.2 mg/dL (ref 8.7–10.3)
Chloride: 104 mmol/L (ref 96–106)
Creatinine, Ser: 0.95 mg/dL (ref 0.57–1.00)
GFR calc Af Amer: 72 mL/min/{1.73_m2} (ref >59)
GFR calc non Af Amer: 62 mL/min/{1.73_m2} (ref >59)
Globulin, Total: 3.3 g/dL (ref 1.5–4.5)
Glucose: 110 mg/dL — ABNORMAL HIGH (ref 65–99)
Potassium: 4.6 mmol/L (ref 3.5–5.2)
Sodium: 140 mmol/L (ref 134–144)
Total Protein: 7.9 g/dL (ref 6.0–8.5)

## 2020-01-21 LAB — VITAMIN D 25 HYDROXY (VIT D DEFICIENCY, FRACTURES): Vit D, 25-Hydroxy: 61.8 ng/mL (ref 30.0–100.0)

## 2020-01-21 LAB — TSH: TSH: 1.17 u[IU]/mL (ref 0.450–4.500)

## 2020-02-16 DIAGNOSIS — H5213 Myopia, bilateral: Secondary | ICD-10-CM | POA: Diagnosis not present

## 2020-02-22 NOTE — Progress Notes (Signed)
Office Visit Note  Patient: Cindy Ross             Date of Birth: Aug 22, 1950           MRN: [id number]             PCP: Cindy Chard, MD Referring: Cindy Chard, MD Visit Date: 03/07/2020 Occupation: @GUAROCC @   Interpreter: Cindy Ross  Subjective:  Osteoporosis management.   History of Present Illness: Cindy Ross is a 69 y.o. female seen in consultation by request of Dr. Baird Ross for the evaluation of osteoporosis and treatment.  History was taken with the help of interpreter.  Patient is a poor historian.  She states she was not aware she has osteoporosis.  We had detailed discussion regarding osteoporosis.  Patient states she has never taken any treatment for osteoporosis.  She denies any history of recent fracture.  She states when she was a child she fractured her right arm.  Activities of Daily Living:  Patient reports morning stiffness for 0 minutes.   Patient Denies nocturnal pain.  Difficulty dressing/grooming: Denies Difficulty climbing stairs: Denies Difficulty getting out of chair: Denies Difficulty using hands for taps, buttons, cutlery, and/or writing: Denies  Review of Systems  Constitutional: Negative for fatigue.  HENT: Negative for mouth sores, mouth dryness and nose dryness.   Eyes: Negative for itching and dryness.  Respiratory: Negative for shortness of breath and difficulty breathing.   Cardiovascular: Negative for chest pain and palpitations.  Gastrointestinal: Negative for blood in stool, constipation and diarrhea.  Endocrine: Negative for increased urination.  Genitourinary: Negative for difficulty urinating.  Musculoskeletal: Negative for arthralgias, joint pain, joint swelling, myalgias, morning stiffness, muscle tenderness and myalgias.  Skin: Negative for color change, rash and redness.  Allergic/Immunologic: Negative for susceptible to infections.  Neurological: Negative for dizziness, numbness, headaches, memory loss and weakness.   Hematological: Negative for bruising/bleeding tendency.  Psychiatric/Behavioral: Negative for confusion.    PMFS History:  Patient Active Problem List   Diagnosis Date Noted  . Respiratory crackles at left lung base 05/19/2017  . Essential hypertension 01/30/2016  . DM type 2 (diabetes mellitus, type 2) ([place]) 01/30/2016  . Hyperlipidemia 01/30/2016    Past Medical History:  Diagnosis Date  . Diabetes mellitus without complication ([place])   . Hyperlipidemia 01/30/2016  . Hypertension 2007  . Respiratory crackles at left lung base 05/19/2017   CXR 02/2017 in [facility] normal    Family History  Problem Relation Age of Onset  . Hyperlipidemia Sister   . Diabetes Sister   . Hypertension Sister   . Heart disease Brother   . Healthy Son   . Leukemia Brother    History reviewed. No pertinent surgical history. Social History   Social History Narrative   Originally from [place]   Came to [place]. In 76   Lives with younger sister, Cindy Ross   Son lives in [place].  Works in [employer].     Immunization History  Administered Date(s) Administered  . Influenza Inj Mdck Quad Pf 07/31/2016, 05/19/2017  . Influenza, High Dose Seasonal PF 04/30/2018, 03/30/2019  . Influenza-Unspecified 04/17/2018, 03/30/2019  . PFIZER SARS-COV-2 Vaccination 07/23/2019, 08/13/2019  . Pneumococcal Conjugate-13 09/14/2018, 12/29/2018  . Pneumococcal Polysaccharide-23 03/19/2016  . Td 10/31/1994, 04/10/1995  . Tdap 01/30/2016  . Zoster Recombinat (Shingrix) 05/06/2019, 07/12/2019     Objective: Vital Signs: BP 128/70 (BP Location: Right Arm, Patient Position: Sitting, Cuff Size: Normal)   Pulse 78  Resp 14   Ht 5\' 1"  (1.549 m)   Wt 128 lb (58.1 kg)   BMI 24.19 kg/m    Physical Exam Vitals and nursing note reviewed.  Constitutional:      Appearance: She is well-developed.  HENT:     Head: Normocephalic and atraumatic.  Eyes:     Conjunctiva/sclera: Conjunctivae normal.   Cardiovascular:     Rate and Rhythm: Normal rate and regular rhythm.     Heart sounds: Normal heart sounds.  Pulmonary:     Effort: Pulmonary effort is normal.     Breath sounds: Normal breath sounds.  Abdominal:     General: Bowel sounds are normal.     Palpations: Abdomen is soft.  Musculoskeletal:     Cervical back: Normal range of motion.  Lymphadenopathy:     Cervical: No cervical adenopathy.  Skin:    General: Skin is warm and dry.     Capillary Refill: Capillary refill takes less than 2 seconds.  Neurological:     Mental Status: She is alert and oriented to person, place, and time.  Psychiatric:        Behavior: Behavior normal.      Musculoskeletal Exam: C-spine thoracic and lumbar spine with good range of motion without any tenderness.  Shoulder joints with good range of motion.  She has some posttraumatic changes in her right elbow.  She has PIP and DIP thickening consistent with osteoarthritis with no synovitis.  Hip joints, knee joints, ankles, MTPs and PIPs with good range of motion with no synovitis.  CDAI Exam: CDAI Score: -- Patient Global: --; Provider Global: -- Swollen: --; Tender: -- Joint Exam 03/07/2020   No joint exam has been documented for this visit   There is currently no information documented on the homunculus. Go to the Rheumatology activity and complete the homunculus joint exam.  Investigation: No additional findings.  Imaging: No results found.  Recent Labs: Lab Results  Component Value Date   WBC 5.0 01/20/2020   HGB 10.3 (L) 01/20/2020   PLT 207 01/20/2020   NA 140 01/20/2020   K 4.6 01/20/2020   CL 104 01/20/2020   CO2 25 01/20/2020   GLUCOSE 110 (H) 01/20/2020   BUN 21 01/20/2020   CREATININE 0.95 01/20/2020   BILITOT 0.4 01/20/2020   ALKPHOS 82 01/20/2020   AST 18 01/20/2020   ALT 16 01/20/2020   PROT 7.9 01/20/2020   ALBUMIN 4.6 01/20/2020   CALCIUM 10.2 01/20/2020   GFRAA 72 01/20/2020    Speciality Comments: No  specialty comments available.  Procedures:  No procedures performed Allergies: Patient has no known allergies.   Assessment / Plan:     Visit Diagnoses: Age-related osteoporosis without current pathological fracture - DEXA 11/02/19: BMD measured at Femur Neck Right is 0.573 g/cm2 with a T-scoreof -3.3.  -Detailed counseling guarding osteoporosis was provided.  Different treatment options and their side effects were discussed.  We discussed the option of oral bisphosphonates versus IV bisphosphonates versus subcu Prolia.  She prefers to go on subcu Prolia.  I believe the compliance will be better with the subcu Prolia.  Indications side effects contraindications were discussed at length.  Her CBC and CMP was normal recently except for anemia.  I am uncertain about the etiology of anemia.  We will apply for Prolia injections.  Plan: Parathyroid hormone, intact (no Ca), VITAMIN D 25 Hydroxy (Vit-D Deficiency, Fractures), Phosphorus, TSH, Serum protein electrophoresis with reflex  Medication counseling:  Counseled patient  on purpose, proper use, and adverse effects of Prolia.  Counseled patient that Prolia is a medication that must be injected every 6 months by a healthcare professional.  Advised patient to take calcium 1200 mg daily and vitamin D 800 units daily.  Reviewed the most common adverse effects of Prolia including risk of infection, osteonecrosis of the jaw, rash, and muscle/bone pain.  Patient confirms she does not have any major dental work planned at this time.  Reviewed with patient the signs/symptoms of low calcium and advised patient to alert [place] if she experiences these symptoms.  Provided patient with medication education material and answered all questions.  Will apply for Prolia through insurance and update when we receive a response.  Essential hypertension-blood pressure is well controlled.  Type 2 diabetes mellitus without complication, without long-term current use of insulin  ([place])   Fatigue-she has occasional fatigue.  Will check TSH today.  Pure hypercholesterolemia  Language barrier-interpreter was present during the conversation.   Patient is fully awake immunized against COVID-19.  She has been advised to wear mask, social distancing and hand hygiene was discussed.  She was also advised to get a booster once available to her.  Orders: Orders Placed This Encounter  Procedures  . Parathyroid hormone, intact (no Ca)  . VITAMIN D 25 Hydroxy (Vit-D Deficiency, Fractures)  . Phosphorus  . TSH  . Serum protein electrophoresis with reflex   No orders of the defined types were placed in this encounter.  Total time spent in providing care to the patient was 46 minutes.   Follow-Up Instructions: Return for Osteoporosis.   Bo Merino, MD  Note - This record has been created using Editor, commissioning.  Chart creation errors have been sought, but may not always  have been located. Such creation errors do not reflect on  the standard of medical care.

## 2020-02-24 ENCOUNTER — Encounter: Payer: Self-pay | Admitting: Internal Medicine

## 2020-03-07 ENCOUNTER — Telehealth: Payer: Self-pay | Admitting: Pharmacist

## 2020-03-07 ENCOUNTER — Encounter (INDEPENDENT_AMBULATORY_CARE_PROVIDER_SITE_OTHER): Payer: Self-pay

## 2020-03-07 ENCOUNTER — Encounter: Payer: Self-pay | Admitting: Rheumatology

## 2020-03-07 ENCOUNTER — Other Ambulatory Visit: Payer: Self-pay

## 2020-03-07 ENCOUNTER — Ambulatory Visit (INDEPENDENT_AMBULATORY_CARE_PROVIDER_SITE_OTHER): Payer: Medicare Other | Admitting: Rheumatology

## 2020-03-07 VITALS — BP 128/70 | HR 78 | Resp 14 | Ht 61.0 in | Wt 128.0 lb

## 2020-03-07 DIAGNOSIS — E7800 Pure hypercholesterolemia, unspecified: Secondary | ICD-10-CM | POA: Diagnosis not present

## 2020-03-07 DIAGNOSIS — R5383 Other fatigue: Secondary | ICD-10-CM

## 2020-03-07 DIAGNOSIS — Z7189 Other specified counseling: Secondary | ICD-10-CM

## 2020-03-07 DIAGNOSIS — E119 Type 2 diabetes mellitus without complications: Secondary | ICD-10-CM | POA: Diagnosis not present

## 2020-03-07 DIAGNOSIS — I10 Essential (primary) hypertension: Secondary | ICD-10-CM | POA: Diagnosis not present

## 2020-03-07 DIAGNOSIS — M810 Age-related osteoporosis without current pathological fracture: Secondary | ICD-10-CM

## 2020-03-07 DIAGNOSIS — Z789 Other specified health status: Secondary | ICD-10-CM

## 2020-03-07 NOTE — Telephone Encounter (Signed)
Medical Benefit:  Findings of benefits investigation for Prolia :   Insurance: [employer] Medicare Advantage/ [place] Medicaid   Phone:  [phone]  Plan is ACTIVE  Plan follows Medicare guidelines and covers 80% of the injection and no authorization is required for [id number]. Patient will be required to pay the 20% coinsurance per each injection, which will be applied to [facility] [employer] [place]. Patient has met $3,600 as of $0.00.  Patient has Active Medicaid as secondary plan,and should pick up remaining 20% coinsurance.  Pharmacy benefit-  No PA required. Patient has active Medicaid as secondary. Copay should be no more than $4.00. Will attempt test claim later if needed.  11:49 AM Beatriz Chancellor, CPhT

## 2020-03-07 NOTE — Patient Instructions (Signed)
Denosumab injection ?y l thu?c g? DENOSUMAB lm ch?m s? suy y?u x??ng. Prolia ???c dng ?? ?i?u tr? ch?ng long x??ng ? ph? n? sau khi mn kinh v ? nam gi?i, v ? nh?ng ng??i dng cc thu?c corticosteroid trong 6 thng ho?c lu h?n. Xgeva ???c dng ?? ?i?u tr? tnh tr?ng t?ng cao calci v ung th? v ?? phng ng?a b? gy x??ng v cc v?n ?? v? x??ng khc gy ra do b?nh ?a u t?y ho?c cc di c?n x??ng trong ung th?. Xgeva c?ng ???c dng ?? ?i?u tr? b??u t? 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 Thu?c ny c th? ???c dng cho nh?ng m?c ?ch khc; hy h?i ng??i cung c?p d?ch v? y t? ho?c d??c s? c?a mnh, n?u qu v? c th?c m?c. (CC) NHN HI?U PH? BI?N: Prolia, XGEVA Ti c?n ph?i bo cho ng??i cung c?p d?ch v? y t? c?a mnh ?i?u g tr??c khi dng thu?c ny? H? c?n bi?t li?u qu v? c b?t k? tnh tr?ng no sau ?y khng:  b?nh v? r?ng  ???c gi?i ph?u ho?c nh? r?ng  nhi?m trng  b?nh th?n  m?c calci ho?c Vitamin D trong mu th?p  suy dinh d??ng  ?ang ???c th?m phn  cc tnh tr?ng b?nh l ho?c m?n c?m c?a da  b?nh tuy?n gip tr?ng ho?c tuy?n c?n gip tr?ng  ph?n ?ng b?t th??ng v?i denosumab  pha?n ??ng b?t th???ng ho??c di? ??ng v??i ca?c d??c ph?m kha?c  pha?n ??ng b?t th???ng ho??c di? ??ng v??i th??c ph?m, thu?c nhu?m, ho??c ch?t ba?o qua?n  ?ang c thai ho??c ??nh co? thai  ?ang cho con bu? Ti nn s? d?ng thu?c ny nh? th? no? Thu?c ny ?? tim d??i da. Thu?c ny ???c s? d?ng b?i chuyn vin y t? ? b?nh vi?n ho?c ? phng m?ch. Qu v? s? ???c nh?n m?t B?n H??ng D?n v? D??c Ph?m (MedGuide) tr??c m?i l?n ?i?u tr?. Hy b?o ??m ??c k? thng tin ny m?i l?n. ??i v?i Prolia, hy bn v?i bc s? nhi khoa c?a qu v? v? vi?c dng thu?c ny ? tr? em. C th? c?n ch?m sc ??c bi?t. ??i v?i Xgeva, hy bn v?i bc s? nhi khoa c?a qu v? v? vi?c dng thu?c ny ? tr? em. Thu?c ny c th? ???c k toa cho tr? em ch? m?i 13 tu?i trong nh?ng tr??ng h?p ch?n l?c, nh?ng c?n ph?i th?n  tr?ng. Qu li?u: N?u qu v? cho r?ng mnh ? dng qu nhi?u thu?c ny, th hy lin l?c v?i trung tm ki?m sot ch?t ??c ho?c phng c?p c?u ngay l?p t?c. L?U : Thu?c ny ch? dnh ring cho qu v?. Khng chia s? thu?c ny v?i nh?ng ng??i khc. N?u ti l? qun m?t li?u th sao? ?i?u quan tr?ng l khng nn b? l? li?u thu?c no. Hy lin l?c v?i bc s? ho?c chuyn vin y t? c?a mnh, n?u qu v? khng th? gi? ?ng cu?c h?n khm. Nh?ng g c th? t??ng tc v?i thu?c ny? Khng ???c dng thu?c ny cng v?i b?t k? th? no sau ?y:  nh??ng thu?c kha?c co? ch??a denosumab Thu?c ny c?ng c th? t??ng tc v?i cc thu?c sau ?y:  nh?ng thu?c lm gi?m c? h?i ch?ng l?i b?nh nhi?m trng  cc thu?c steroid, ch?ng h?n nh? prednisone ho?c cortisone Danh sch ny c th? khng m t? ?? h?t cc t??ng tc c th? x?y ra. Hy ??a cho ng??i cung   c?p d?ch v? y t? c?a mnh danh sch t?t c? cc thu?c, th?o d??c, cc thu?c khng c?n toa, ho?c cc ch? ph?m b? sung m qu v? dng. C?ng nn bo cho h? bi?t r?ng qu v? c ht thu?c, u?ng r??u, ho?c c s? d?ng ma ty tri php hay khng. Vi th? c th? t??ng tc v?i thu?c c?a qu v?. Ti c?n ph?i theo di ?i?u g trong khi dng thu?c ny? Hy ?i g?p bc s? ho?c chuyn vin y t? ?? theo di ??nh k? s? c?i thi?n c?a qu v?. Bc s? c th? yu c?u th? mu v lm cc xt nghi?m khc ?? theo di s? c?i thi?n c?a qu v?. Hy h?i  ki?n bc s? ho?c chuyn vin y t?, n?u qu v? b? s?t, ?n l?nh ho?c ?au h?ng, ho?c c cc tri?u ch?ng khc c?a c?m l?nh ho?c cm. Khng ???c t? ?i?u tr? cho mnh. Thu?c ny c th? lm gi?m kh? n?ng ch?ng l?i cc b?nh nhi?m trng c?a c? th?. Hy c? trnh ? g?n nh?ng ng??i b? b?nh. Qu v? c?n ph?i ch?c ch?n r?ng c? th? qu v? nh?n ???c ??y ?? l??ng calci v vitamin D trong th?i gian dng thu?c ny, tr? khi bc s? yu c?u qu v? ??ng lm nh? v?y. Hy th?o [place]?n v?i bc s? ho?c chuyn vin y t? v? nh?ng th?c ph?m qu v? ?n v nh?ng vitamin qu v? dng. Hy ?i nha s?  th??ng xuyn. ?nh r?ng ho?c x?a r?ng b?ng ch? nha khoa nh? ? ???c ch? d?n. N?u qu v? c ?i lm r?ng, th hy bo v?i nha s? r?ng qu v? ?ang dng thu?c ny. Khng ???c c thai trong th?i gian dng thu?c ny ho?c trong vng 5 thng sau khi ng?ng dng thu?c. Hy bn v?i bc s? ho?c chuyn vin y t? v? cc bi?n php ng?a thai trong th?i gian dng thu?c ny. Ph? n? c?n ph?i thng bo cho bc s? c?a mnh, n?u mu?n c thai ho?c ngh? r?ng c th? mnh ? c [place]. C nguy c? v? cc tc d?ng ph? nghim tr?ng ??i v?i [place] nhi. Hy th?o [place]?n v?i bc s? ho?c chuyn vin y t? ho?c d??c s? ?? bi?t thm thng tin. Ti c th? nh?n th?y nh?ng tc d?ng ph? no khi dng thu?c ny? Nh?ng tc d?ng ph? qu v? c?n ph?i bo cho bc s? ho?c chuyn vin y t? cng s?m cng t?t:  cc ph?n ?ng d? ?ng, ch?ng h?n nh? da b? m?n ??, ng?a, n?i my ?ay, s?ng ? m?t, mi, ho?c l??i  ?au x??ng  kh th?  chng m?t  ?au hm, ??c bi?t l sau khi lm r?ng  b? ?? da, r?p da, bong da  cc d?u hi?u v tri?u ch?ng nhi?m trng, ch?ng h?n nh? s?t ho?c ?n l?nh; ho; ?au h?ng; kh ?i ti?u ho?c ?i ti?u ?au  cc d?u hi?u b? th?p calci, nh? l c nh?p tim nhanh, b? chu?t rt hay ?au b?p th?t; b? ?au, nh?c, b? t ? tay hay chn; b? co gi?t  b? b?m tm ho?c xu?t huy?t b?t th??ng  m?t m?i ho?c y?u ?t b?t th??ng Cc tc d?ng ph? khng c?n ph?i ch?m [place] y t? (hy bo cho bc s? ho?c chuyn vin y t?, n?u cc tc d?ng ph? ny ti?p di?n ho?c gy phi?n toi):  to bn  tiu ch?y  ?au ??u  ?au ho?c nh?c kh?p  m?t c?m gic ngon  mi?ng  ?au ho?c nh?c c? b?p  s? m?i  c?m th?y m?t m?i  kh ch?u ? bao t? Danh sch ny c th? khng m t? ?? h?t cc tc d?ng ph? c th? x?y ra. Xin g?i t?i bc s? c?a mnh ?? ???c c? v?n chuyn mn v? cc tc d?ng ph?Sander Nephew v? c th? t??ng trnh cc tc d?ng ph? cho FDA theo s? [phone]. Ti nn c?t gi? thu?c c?a mnh ? ?u? Thu?c ny ch? ???c dng trong phng m?ch, v?n phng bc s? ho?c c? s? y t? khc  v s? khng ???c c?t gi? t?i nh. L?U : ?y l b?n tm t?t. N c th? khng bao hm t?t c? thng tin c th? c. N?u qu v? th?c m?c v? thu?c ny, xin trao ??i v?i bc s?, d??c s?, ho?c ng??i cung c?p d?ch v? y t? c?a mnh.  2020 Elsevier/Gold Standard (2017-11-17 00:00:00) Long x??ng Osteoporosis  Long x??ng l tnh tr?ng m?ng v m?t m?t ?? trong x??ng. Long x??ng khi?n x??ng gin, y?u v d? v? h?n (gy x??ng). Theo th?i gian, long x??ng c th? lm cho x??ng tr? nn qu y?u ??n n?i b? gy sau m?t c ng nh?. Nh?ng x??ng d? b? gy nh?t do long x??ng l x??ng ? hng, c? tay v c?t s?ng. Nguyn nhn g gy ra? Khng r nguyn nhn chnh xc gy ra tnh tr?ng ny. ?i?u g lm t?ng nguy c?? Qu v? c th? c nguy c? b? long x??ng cao h?n n?u qu v?:  C ti?n s? gia ?nh b? b?nh na?y.  Dinh d??ng km.  S? d?ng thu?c steroid, ch?ng h?n nh? prednisone.  La? n?? gi??i.  50 tu?i tr? ln.  Ht thu?c ho?c c ti?n s? ht thu?c.  Khng tch c?c ho?t ??ng th? ch?t (t v?n ??ng).  L ng??i da tr?ng (Ng??i da tr?ng) ho?c g?c .  C khung c? th? nh?.  Dng m?t s? lo?i thu?c nh?t ??nh, ch?ng h?n nh? thu?c ch?ng co gi?t. Cc d?u hi?u ho?c tri?u ch?ng l g? Gy x??ng c th? l d?u hi?u ??u tin c?a long x??ng, ??c bi?t l n?u gy x??ng l do m?t c ng ho?c ch?n th??ng m th??ng khng khi?n cho x??ng b? gy. Cc d?u hi?u v tri?u ch?ng khc bao g?m:  ?au ? c? ho?c th?t l?ng.  Dng l?ng khom.  Chi?u cao gi?m. Ch?n ?on tnh tr?ng ny nh? th? no? Tnh tr?ng ny c th? ???c ch?n ?on d?a vo:  B?nh s? c?a qu v?.  Khm th?c th?.  Ki?m tra m?t ?? ch?t khong trong x??ng, cn g?i l ki?m tra DXA ho?c DEXA (ki?m tra ?o ?? h?p thu tia X n?ng l??ng kp). Ki?m tra ny s? d?ng tia X ?? ?o l??ng ch?t khong trong x??ng. Tnh tr?ng ny ???c ?i?u tr? nh? th? no? M?c tiu c?a ?i?u tr? long x??ng l t?ng c??ng s?c m?nh c?a x??ng v lm gi?m nguy c? gy x??ng. ?i?u tr? c th? bao g?m:  Th?c  hi?n thay ??i l?i s?ng, ch?ng h?n nh?: ? ??a cc th?c ph?m nhi?u canxi v vitamin D h?n vo ch? ?? ?n. ? T?p cc bi t?p ch?u s?c n?ng v t?ng c??ng s?c m?nh c? b?p. ? D?ng s? d?ng thu?c l. ? Gi?i h?n l??ng r??u tiu th?.  Dng thu?c ?? lm ch?m qu trnh m?t x??ng ho?c lm t?ng m?t ?? x??ng.  Dng cc ch?t b? sung canxi v vitamin  D hng ngy.  Dng cc thu?c thay th? hc mn, ch?ng h?n nh? estrogen dnh cho n? gi?i v testosterone dnh cho nam gi?i.  Theo di n?ng ?? canxi v vitamin D. Tun th? nh?ng h??ng d?n ny ? nh:  Ho?t ??ng  T?p th? d?c theo ch? d?n c?a chuyn gia ch?m [place] s?c kh?e. Hy h?i chuyn gia ch?m [place] s?c kh?e bi t?p th? d?c v ho?t ??ng no an ton cho qu v?. Qu v? nn t?p: ? Cc bi t?p ch?ng l?i tr?ng l?c (bi t?p ch?u ??ng s?c n?ng), ch?ng h?n nh? thi c?c quy?n, yoga, ho?c ?i b?. ? Cc bi t?p lm kh?e c? nh? nng t?. L?i s?ng  Gi?i h?n l??ng r??u qu v? u?ng khng qu 1 ly m?i ngy v?i ph? n? khng mang thai v 2 ly m?i ngy v?i nam gi?i. M?t ly t??ng ???ng v?i 12 aox? bia, 5 aox? r??u vang, ho?c 1 aox? r??u m?nh.  Khng s? d?ng b?t k? s?n ph?m no c nicotine ho?c thu?c l, ch?ng ha?n nh? thu?c l d?ng ht v thu?c l ?i?n t?. N?u qu v? c?n gip ?? ?? cai thu?c, hy h?i chuyn gia ch?m [place] s?c kh?e. Phng trnh t ng  S? d?ng cc cng c? ?? gip qu v? ?i l?i (cng c? h? tr? di chuy?n) khi c?n nh? g?y, khung t?p ?i, xe ??y ho?c n?ng.  Gi? cho cc phng trong nh sng s?a v g?n gng.  D?n nh?ng ch??ng ng?i gy v?p ng kh?i l?i ?i nh? dy d? v cc t?m th?m long r?i.  L?p cc thanh v?n trong phng t?m v tay v?n an ton trn c?u thang b?.  S? d?ng th?m cao su trong phng t?m v nh?ng khu v?c khc th??ng xuyn ??t ho?c tr?n.  ?i giy kn ngn chn v??a v??n v h? tr? bn chn c?a quy? vi?. ?i giy c ?? cao su ho?c gt th?p.  Xem xt l?i cc thu?c c?a quy? vi? v?i chuyn gia ch?m [place] s?c kh?e. M?t s? lo?i thu?c c th? gy chng m?t ho?c thay ??i  huy?t p, c th? lm t?ng nguy c? b? ng. H??ng d?n chung  ??a canxi v vitamin D vo ch? ?? ?n c?a qu v?. Canxi l ch?t quan tr?ng cho s?c kh?e c?a x??ng v vitamin D gip c? th? h?p th? canxi. Cc ngu?n canxi v vitamin D t?t bao g?m: ? M?t s? lo?i c bo nh? c h?i v c ng?. ? Cc s?n ph?m ???c b? sung canxi v vitamin D (cc s?n ph?m???c b? sung), ch?ng h?n nh? ng? c?c ???c b? sung. ? Lng ?? tr?ng. ? Pho mt. ? Eda Paschal.  Ch? s? d?ng thu?c khng k ??n v thu?c k ??n theo ch? d?n c?a chuyn gia ch?m [place] s?c kh?e.  Tun th? t?t c? cc l?n khm theo di theo ch? d?n c?a chuyn gia ch?m [place] s?c kh?e. ?i?u ny c vai tr quan tr?ng. Hy lin l?c v?i chuyn gia ch?m  s?c kh?e n?u:  Qu v? ch?a t?ng ???c sng l?c long x??ng v qu v?: ? L n? t? 65 tu?i tr? ln. ? L nam t? 70 tu?i tr? ln. Yu c?u tr? gip ngay l?p t?c n?u:  Qu v? t? ng ho?c b? th??ng. Tm t?t  Long x??ng l tnh tr?ng m?ng v m?t m?t ?? trong x??ng. Long x??ng khi?n x??ng gin, y?u v d? gy h?n (gy x??ng),ngay c? v?i m?t c ng nh?Marland Kitchen  M?c tiu c?a ?i?u tr? long x??ng l t?ng c??ng s?c m?nh c?a x??ng v lm gi?m nguy c? gy x??ng.  ??a canxi v vitamin D vo ch? ?? ?n c?a qu v?. Canxi l ch?t quan tr?ng cho s?c kh?e c?a x??ng v vitamin D gip c? th? h?p th? canxi.  Hy trao ??i v?i chuyn gia ch?m [employer] s?c kh?e v? sng l?c long x??ng n?u qu v? l n? t? 65 tu?i tr? ln v nam gi?i t? 70 tu?i tr? ln. Thng tin ny khng nh?m m?c ?ch thay th? cho l?i khuyn m chuyn gia ch?m [place] s?c kh?e ni v?i qu v?. Hy b?o ??m qu v? ph?i th?o [place]?n b?t k? v?n ?? g m qu v? c v?i chuyn gia ch?m [place] s?c kh?e c?a qu v?. Document Revised: 09/13/2017 Document Reviewed: 05/23/2017 Elsevier Patient Education  2020 [employer].

## 2020-03-07 NOTE — Progress Notes (Signed)
Pharmacy Note  Subjective:  Patient presents today to [facility] for follow up office visit.   Patient was seen by the pharmacist for counseling on Prolia. She is naive to osteoporosis therapy.  Most recent Dexa on 11/02/19 showed lowest T-score of -3.3 at right femur neck.  Objective: CMP     Component Value Date/Time   NA 140 01/20/2020 1245   K 4.6 01/20/2020 1245   CL 104 01/20/2020 1245   CO2 25 01/20/2020 1245   GLUCOSE 110 (H) 01/20/2020 1245   BUN 21 01/20/2020 1245   CREATININE 0.95 01/20/2020 1245   CALCIUM 10.2 01/20/2020 1245   PROT 7.9 01/20/2020 1245   ALBUMIN 4.6 01/20/2020 1245   AST 18 01/20/2020 1245   ALT 16 01/20/2020 1245   ALKPHOS 82 01/20/2020 1245   BILITOT 0.4 01/20/2020 1245   GFRNONAA 62 01/20/2020 1245   GFRAA 72 01/20/2020 1245    Vitamin D Lab Results  Component Value Date   VD25OH 61.8 01/20/2020    Assessment/Plan:  Counseled patient on purpose, proper use, and adverse effects of Prolia.  Counseled patient that Prolia is a medication that must be injected every 6 months by a healthcare professional.  Advised patient to take calcium 1200 mg daily and vitamin D 800 units daily.  Reviewed the most common adverse effects of Prolia including risk of infection, osteonecrosis of the jaw, rash, and muscle/bone pain.  Patient confirms she does not have any major dental work planned at this time.  Reviewed with patient the signs/symptoms of low calcium and advised patient to alert [place] if she experiences these symptoms.  Provided patient with medication education material and answered all questions. Will apply for Prolia through patient's insurance.   Mariella Saa, PharmD, [place], CPP Clinical Specialty Pharmacist (Rheumatology and Pulmonology)  03/07/2020 9:39 AM

## 2020-03-07 NOTE — Telephone Encounter (Signed)
Please start benefits investigation for Prolia for the treatment of osteoporosis.  Patient is nave to osteoporosis treatment.  Most recent in May DEXA showed T score of -3.3 right femur neck.   Mariella Saa, PharmD, [place], CPP Clinical Specialty Pharmacist (Rheumatology and Pulmonology)  03/07/2020 10:39 AM

## 2020-03-08 ENCOUNTER — Other Ambulatory Visit: Payer: Self-pay

## 2020-03-09 LAB — PROTEIN ELECTROPHORESIS, SERUM, WITH REFLEX
Albumin ELP: 4.2 g/dL (ref 3.8–4.8)
Alpha 1: 0.2 g/dL (ref 0.2–0.3)
Alpha 2: 0.8 g/dL (ref 0.5–0.9)
Beta 2: 0.4 g/dL (ref 0.2–0.5)
Beta Globulin: 0.4 g/dL (ref 0.4–0.6)
Gamma Globulin: 1.8 g/dL — ABNORMAL HIGH (ref 0.8–1.7)
Total Protein: 7.8 g/dL (ref 6.1–8.1)

## 2020-03-09 LAB — PARATHYROID HORMONE, INTACT (NO CA): PTH: 48 pg/mL (ref 14–64)

## 2020-03-09 LAB — TSH: TSH: 1.73 mIU/L (ref 0.40–4.50)

## 2020-03-09 LAB — VITAMIN D 25 HYDROXY (VIT D DEFICIENCY, FRACTURES): Vit D, 25-Hydroxy: 70 ng/mL (ref 30–100)

## 2020-03-09 LAB — PHOSPHORUS: Phosphorus: 3.9 mg/dL (ref 2.1–4.3)

## 2020-03-09 NOTE — Progress Notes (Signed)
All the labs are normal.  It is okay for her to start on Prolia.

## 2020-03-13 DIAGNOSIS — H524 Presbyopia: Secondary | ICD-10-CM | POA: Diagnosis not present

## 2020-03-14 ENCOUNTER — Other Ambulatory Visit: Payer: Self-pay | Admitting: Rheumatology

## 2020-03-14 MED ORDER — DENOSUMAB 60 MG/ML ~~LOC~~ SOSY: 60.0000 mg | PREFILLED_SYRINGE | SUBCUTANEOUS | 0 refills | Status: DC

## 2020-03-14 NOTE — Telephone Encounter (Signed)
Patient scheduled for a Prolia injection on 03/21/2020.

## 2020-03-14 NOTE — Telephone Encounter (Signed)
Prescription can be sent to [facility]  for delivery and schedule Prolia appointment on nurses schedule.

## 2020-03-17 MED FILL — PROLIA 60 MG/ML SOLN: 60 | 180 days supply | Qty: 1 | Fill #0

## 2020-03-18 NOTE — Progress Notes (Signed)
Office Visit Note  Patient: Cindy Ross             Date of Birth: 1951-04-28           MRN: [id number]             PCP: Cindy Chard, MD Referring: Cindy Chard, MD Visit Date: 03/30/2020 Occupation: @GUAROCC @  Subjective:  Medication management.   History of Present Illness: Cindy Ross is a 69 y.o. female with history of osteoporosis.  She received her first Prolia injection on March 21, 2020.  She tolerated the medication well.  She reports no side effects.  She has been taking calcium and vitamin D.  She denies any joint pain.  Activities of Daily Living:  Patient reports morning stiffness for 0 minutes.   Patient Denies nocturnal pain.  Difficulty dressing/grooming: Denies Difficulty climbing stairs: Denies Difficulty getting out of chair: Denies Difficulty using hands for taps, buttons, cutlery, and/or writing: Denies  Review of Systems  Constitutional: Negative for fatigue.  HENT: Negative for mouth sores, mouth dryness and nose dryness.   Eyes: Negative for pain, itching and dryness.  Respiratory: Negative for shortness of breath and difficulty breathing.   Cardiovascular: Negative for chest pain and palpitations.  Gastrointestinal: Negative for constipation and diarrhea.  Endocrine: Negative for increased urination.  Genitourinary: Negative for difficulty urinating.  Musculoskeletal: Negative for arthralgias, joint pain, joint swelling, myalgias, morning stiffness, muscle tenderness and myalgias.  Skin: Negative for color change, rash and redness.  Allergic/Immunologic: Negative for susceptible to infections.  Neurological: Negative for dizziness, numbness, headaches, memory loss and weakness.  Hematological: Positive for bruising/bleeding tendency.  Psychiatric/Behavioral: Negative for confusion and sleep disturbance.    PMFS History:  Patient Active Problem List   Diagnosis Date Noted  . Age-related osteoporosis without current pathological fracture  03/30/2020  . Language barrier 03/30/2020  . Respiratory crackles at left lung base 05/19/2017  . Essential hypertension 01/30/2016  . DM type 2 (diabetes mellitus, type 2) ([place]) 01/30/2016  . Hyperlipidemia 01/30/2016    Past Medical History:  Diagnosis Date  . Diabetes mellitus without complication ([place])   . Hyperlipidemia 01/30/2016  . Hypertension 2007  . Respiratory crackles at left lung base 05/19/2017   CXR 02/2017 in [facility] normal    Family History  Problem Relation Age of Onset  . Hyperlipidemia Sister   . Diabetes Sister   . Hypertension Sister   . Heart disease Brother   . Healthy Son   . Leukemia Brother    History reviewed. No pertinent surgical history. Social History   Social History Narrative   Originally from [place]   Came to [place] In 26   Lives with younger sister, Cindy Ross   Son lives in [place].  Works in [employer].     Immunization History  Administered Date(s) Administered  . Influenza Inj Mdck Quad Pf 07/31/2016, 05/19/2017  . Influenza, High Dose Seasonal PF 04/30/2018, 03/30/2019  . Influenza-Unspecified 04/17/2018, 03/30/2019  . PFIZER SARS-COV-2 Vaccination 07/23/2019, 08/13/2019, 03/14/2020  . Pneumococcal Conjugate-13 09/14/2018, 12/29/2018  . Pneumococcal Polysaccharide-23 03/19/2016  . Td 10/31/1994, 04/10/1995  . Tdap 01/30/2016  . Zoster Recombinat (Shingrix) 05/06/2019, 07/12/2019     Objective: Vital Signs: BP 126/75 (BP Location: Left Arm, Patient Position: Sitting, Cuff Size: Normal)   Pulse 74   Resp 13   Ht 5\' 1"  (1.549 m)   Wt 130 lb 9.6 oz (59.2 kg)   BMI 24.68 kg/m    Physical  Exam Vitals and nursing note reviewed.  Constitutional:      Appearance: She is well-developed.  HENT:     Head: Normocephalic and atraumatic.  Eyes:     Conjunctiva/sclera: Conjunctivae normal.  Cardiovascular:     Rate and Rhythm: Normal rate and regular rhythm.     Heart sounds: Normal heart sounds.   Pulmonary:     Effort: Pulmonary effort is normal.     Breath sounds: Normal breath sounds.  Abdominal:     General: Bowel sounds are normal.     Palpations: Abdomen is soft.  Musculoskeletal:     Cervical back: Normal range of motion.  Lymphadenopathy:     Cervical: No cervical adenopathy.  Skin:    General: Skin is warm and dry.     Capillary Refill: Capillary refill takes less than 2 seconds.  Neurological:     Mental Status: She is alert and oriented to person, place, and time.  Psychiatric:        Behavior: Behavior normal.      Musculoskeletal Exam: C-spine was in good range of motion.  She is some thoracic kyphosis.  Shoulder joints, elbow joints, wrist joints, MCPs and PIPs with good range of motion with no synovitis.  Hip joints, knee joints, ankles, MTPs and PIPs with good range of motion with no synovitis. CDAI Exam: CDAI Score: -- Patient Global: --; Provider Global: -- Swollen: --; Tender: -- Joint Exam 03/30/2020   No joint exam has been documented for this visit   There is currently no information documented on the homunculus. Go to the Rheumatology activity and complete the homunculus joint exam.  Investigation: No additional findings.  Imaging: No results found.  Recent Labs: Lab Results  Component Value Date   WBC 5.0 01/20/2020   HGB 10.3 (L) 01/20/2020   PLT 207 01/20/2020   NA 140 01/20/2020   K 4.6 01/20/2020   CL 104 01/20/2020   CO2 25 01/20/2020   GLUCOSE 110 (H) 01/20/2020   BUN 21 01/20/2020   CREATININE 0.95 01/20/2020   BILITOT 0.4 01/20/2020   ALKPHOS 82 01/20/2020   AST 18 01/20/2020   ALT 16 01/20/2020   PROT 7.8 03/07/2020   ALBUMIN 4.6 01/20/2020   CALCIUM 10.2 01/20/2020   GFRAA 72 01/20/2020   March 07, 2020 PTH normal, vitamin D 70, phosphorus normal, TSH normal, SPEP showed increased gammaglobulin  Speciality Comments: No specialty comments available.  Procedures:  No procedures performed Allergies: Patient  has no known allergies.   Assessment / Plan:     Visit Diagnoses: Age-related osteoporosis without current pathological fracture - DEXA 11/02/19: BMD measured at Femur Neck Right is 0.573 g/cm2 with a T-scoreof -3.3.  She received Prolia on March 21, 2020.  We will check CMP with GFR today..  Medication management - Plan: COMPLETE METABOLIC PANEL WITH GFR  Abnormal SPEP -SPEP showed elevated gammaglobulin.  Plan: IgG, IgA, IgM  Essential hypertension-blood pressure is well controlled.  Pure hypercholesterolemia  Type 2 diabetes mellitus without complication, without long-term current use of insulin (Ages)  Language barrier - [place] speaking interpreter needed.  Educated about COVID-19 virus infection-she is fully vaccinated against COVID-19.  Use of mask, social distancing and hand hygiene was discussed.  Orders: Orders Placed This Encounter  Procedures  . COMPLETE METABOLIC PANEL WITH GFR  . IgG, IgA, IgM   No orders of the defined types were placed in this encounter.    Follow-Up Instructions: Return in about 6 months (around 09/28/2020)  for Osteoporosis.   Bo Merino, MD  Note - This record has been created using Editor, commissioning.  Chart creation errors have been sought, but may not always  have been located. Such creation errors do not reflect on  the standard of medical care.

## 2020-03-21 ENCOUNTER — Other Ambulatory Visit: Payer: Self-pay

## 2020-03-21 ENCOUNTER — Ambulatory Visit (INDEPENDENT_AMBULATORY_CARE_PROVIDER_SITE_OTHER): Payer: Medicare Other | Admitting: *Deleted

## 2020-03-21 VITALS — BP 152/80 | HR 68

## 2020-03-21 DIAGNOSIS — M810 Age-related osteoporosis without current pathological fracture: Secondary | ICD-10-CM | POA: Diagnosis not present

## 2020-03-21 MED ORDER — DENOSUMAB 60 MG/ML ~~LOC~~ SOSY
60.0000 mg | PREFILLED_SYRINGE | Freq: Once | SUBCUTANEOUS | Status: AC
  Administered 2020-03-21: 60 mg via SUBCUTANEOUS

## 2020-03-21 NOTE — Progress Notes (Signed)
Pharmacy Note  Subjective:   Patient presents to clinic today to receive bi-annual dose of Prolia.  Patient running a fever or have signs/symptoms of infection? No  Patient currently on antibiotics for the treatment of infection? No  Patient had fall in the last 6 months?  Yes  If yes, did it require medical attention? No   Patient taking calcium 1200 mg daily through diet or supplement and at least 800 units vitamin D? Yes  Objective: CMP     Component Value Date/Time   NA 140 01/20/2020 1245   K 4.6 01/20/2020 1245   CL 104 01/20/2020 1245   CO2 25 01/20/2020 1245   GLUCOSE 110 (H) 01/20/2020 1245   BUN 21 01/20/2020 1245   CREATININE 0.95 01/20/2020 1245   CALCIUM 10.2 01/20/2020 1245   PROT 7.8 03/07/2020 0940   PROT 7.9 01/20/2020 1245   ALBUMIN 4.6 01/20/2020 1245   AST 18 01/20/2020 1245   ALT 16 01/20/2020 1245   ALKPHOS 82 01/20/2020 1245   BILITOT 0.4 01/20/2020 1245   GFRNONAA 62 01/20/2020 1245   GFRAA 72 01/20/2020 1245    CBC    Component Value Date/Time   WBC 5.0 01/20/2020 1245   RBC 3.36 (L) 01/20/2020 1245   HGB 10.3 (L) 01/20/2020 1245   HCT 31.4 (L) 01/20/2020 1245   PLT 207 01/20/2020 1245   MCV 94 01/20/2020 1245   MCH 30.7 01/20/2020 1245   MCHC 32.8 01/20/2020 1245   RDW 12.1 01/20/2020 1245   LYMPHSABS 3.9 (H) 11/18/2016 0936   EOSABS 0.6 (H) 11/18/2016 0936   BASOSABS 0.0 11/18/2016 0936    Lab Results  Component Value Date   VD25OH 70 03/07/2020    T-score: -3.3  Assessment/Plan:   Patient tolerated injection well. Patient monitored in the office for 30 minutes for adverse reactions. No adverse reactions noted.   Patient is to return in 10-14 days for labs to monitor for hypocalcemia.  Future orders placed.   All questions encouraged and answered.  Instructed patient to call with any further questions or concerns.

## 2020-03-21 NOTE — Patient Instructions (Signed)
Standing Labs We placed an order today for your standing lab work.   Please have your standing labs drawn in 10-14 days.  If possible, please have your labs drawn 2 weeks prior to your appointment so that the provider can discuss your results at your appointment.  We have open lab daily Monday through Thursday from 8:30-12:30 PM and 1:30-4:30 PM and Friday from 8:30-12:30 PM and 1:30-4:00 PM at the office of Dr. Bo Merino, [facility].   Please be advised, patients with office appointments requiring lab work will take precedents over walk-in lab work.  If possible, please come for your lab work on Monday and Friday afternoons, as you may experience shorter wait times. The office is located at [address] [place], [place], [place] [address] No appointment is necessary.   Labs are drawn by [facility]. Please bring your co-pay at the time of your lab draw.  You may receive a bill from [facility] for your lab work.  If you wish to have your labs drawn at another location, please call the office 24 hours in advance to send orders.  If you have any questions regarding directions or hours of operation,  please call [phone].   As a reminder, please drink plenty of water prior to coming for your lab work. Thanks!

## 2020-03-30 ENCOUNTER — Other Ambulatory Visit: Payer: Self-pay

## 2020-03-30 ENCOUNTER — Ambulatory Visit (INDEPENDENT_AMBULATORY_CARE_PROVIDER_SITE_OTHER): Payer: Medicare Other | Admitting: Rheumatology

## 2020-03-30 ENCOUNTER — Encounter: Payer: Self-pay | Admitting: Rheumatology

## 2020-03-30 VITALS — BP 126/75 | HR 74 | Resp 13 | Ht 61.0 in | Wt 130.6 lb

## 2020-03-30 DIAGNOSIS — M810 Age-related osteoporosis without current pathological fracture: Secondary | ICD-10-CM | POA: Diagnosis not present

## 2020-03-30 DIAGNOSIS — Z789 Other specified health status: Secondary | ICD-10-CM | POA: Insufficient documentation

## 2020-03-30 DIAGNOSIS — I10 Essential (primary) hypertension: Secondary | ICD-10-CM

## 2020-03-30 DIAGNOSIS — R778 Other specified abnormalities of plasma proteins: Secondary | ICD-10-CM

## 2020-03-30 DIAGNOSIS — E7800 Pure hypercholesterolemia, unspecified: Secondary | ICD-10-CM | POA: Diagnosis not present

## 2020-03-30 DIAGNOSIS — Z7189 Other specified counseling: Secondary | ICD-10-CM

## 2020-03-30 DIAGNOSIS — Z79899 Other long term (current) drug therapy: Secondary | ICD-10-CM

## 2020-03-30 DIAGNOSIS — E119 Type 2 diabetes mellitus without complications: Secondary | ICD-10-CM

## 2020-03-31 LAB — COMPLETE METABOLIC PANEL WITH GFR
AG Ratio: 1.4 (calc) (ref 1.0–2.5)
ALT: 18 U/L (ref 6–29)
AST: 19 U/L (ref 10–35)
Albumin: 4.6 g/dL (ref 3.6–5.1)
Alkaline phosphatase (APISO): 67 U/L (ref 37–153)
BUN: 22 mg/dL (ref 7–25)
CO2: 27 mmol/L (ref 20–32)
Calcium: 9.8 mg/dL (ref 8.6–10.4)
Chloride: 106 mmol/L (ref 98–110)
Creat: 0.89 mg/dL (ref 0.50–0.99)
GFR, Est African American: 78 mL/min/{1.73_m2} (ref >=60)
GFR, Est Non African American: 67 mL/min/{1.73_m2} (ref >=60)
Globulin: 3.2 g/dL (calc) (ref 1.9–3.7)
Glucose, Bld: 103 mg/dL — ABNORMAL HIGH (ref 65–99)
Potassium: 4.6 mmol/L (ref 3.5–5.3)
Sodium: 141 mmol/L (ref 135–146)
Total Bilirubin: 0.6 mg/dL (ref 0.2–1.2)
Total Protein: 7.8 g/dL (ref 6.1–8.1)

## 2020-03-31 LAB — IGG, IGA, IGM
IgG (Immunoglobin G), Serum: 1870 mg/dL — ABNORMAL HIGH (ref 600–1540)
IgM, Serum: 81 mg/dL (ref 50–300)
Immunoglobulin A: 277 mg/dL (ref 70–320)

## 2020-03-31 NOTE — Progress Notes (Signed)
CMP is normal.

## 2020-03-31 NOTE — Progress Notes (Signed)
IgG is mildly elevated, not of concern.

## 2020-05-25 ENCOUNTER — Other Ambulatory Visit: Payer: Self-pay | Admitting: Internal Medicine

## 2020-05-30 ENCOUNTER — Other Ambulatory Visit: Payer: Self-pay

## 2020-05-30 ENCOUNTER — Encounter: Payer: Self-pay | Admitting: Internal Medicine

## 2020-05-30 ENCOUNTER — Ambulatory Visit (INDEPENDENT_AMBULATORY_CARE_PROVIDER_SITE_OTHER): Payer: Medicare Other | Admitting: Internal Medicine

## 2020-05-30 VITALS — BP 110/60 | HR 78 | Temp 98.1°F | Ht 61.0 in | Wt 128.8 lb

## 2020-05-30 DIAGNOSIS — D649 Anemia, unspecified: Secondary | ICD-10-CM

## 2020-05-30 DIAGNOSIS — E7800 Pure hypercholesterolemia, unspecified: Secondary | ICD-10-CM

## 2020-05-30 DIAGNOSIS — E118 Type 2 diabetes mellitus with unspecified complications: Secondary | ICD-10-CM | POA: Diagnosis not present

## 2020-05-30 DIAGNOSIS — I10 Essential (primary) hypertension: Secondary | ICD-10-CM

## 2020-05-30 DIAGNOSIS — E782 Mixed hyperlipidemia: Secondary | ICD-10-CM

## 2020-05-30 MED ORDER — ATORVASTATIN CALCIUM 20 MG PO TABS: 20.0000 mg | ORAL_TABLET | Freq: Every day | ORAL | 2 refills | Status: DC

## 2020-05-30 MED ORDER — LANCETS 33G MISC: 3 refills | Status: DC

## 2020-05-30 MED ORDER — OLMESARTAN MEDOXOMIL-HCTZ 40-12.5 MG PO TABS: 1.0000 | ORAL_TABLET | Freq: Every day | ORAL | 1 refills | Status: DC

## 2020-05-30 MED ORDER — AGAMATRIX PRESTO TEST VI STRP: ORAL_STRIP | 3 refills | Status: DC

## 2020-05-30 MED ORDER — AGAMATRIX PRESTO PRO METER DEVI: 1 refills | Status: DC

## 2020-05-30 NOTE — Progress Notes (Signed)
I,Katawbba Wiggins,acting as a Education administrator for [name], MD.,have documented all relevant documentation on the behalf of [name], MD,as directed by  [name], MD while in the presence of [name], MD.  This visit occurred during the SARS-CoV-2 public health emergency.  Safety protocols were in place, including screening questions prior to the visit, additional usage of staff PPE, and extensive cleaning of exam room while observing appropriate contact time as indicated for disinfecting solutions.  Subjective:     Patient ID: Cindy Ross , female    DOB: June 11, 1951 , 69 y.o.   MRN: [id number]   Chief Complaint  Patient presents with  . Hypertension  . Diabetes    HPI  She presents today for bp/dm check.  She is accompanied by an interpreter today. She has no specific concerns or complaints at this time. She reports compliance with meds. She denies having any questions regarding her chronic illnesses.   Hypertension This is a chronic problem. The current episode started more than 1 year ago. The problem has been gradually improving since onset. The problem is controlled. Pertinent negatives include no blurred vision or palpitations. Risk factors for coronary artery disease include diabetes mellitus, dyslipidemia and post-menopausal state. The current treatment provides moderate improvement.  Diabetes She presents for her follow-up diabetic visit. She has type 2 diabetes mellitus. There are no hypoglycemic associated symptoms. Pertinent negatives for diabetes include no blurred vision. There are no hypoglycemic complications. She is compliant with treatment most of the time. She is following a diabetic diet. When asked about meal planning, she reported none. She participates in exercise intermittently. Her home blood glucose trend is fluctuating minimally. An ACE inhibitor/angiotensin II receptor blocker is being taken. Eye exam is not current.     Past Medical History:   Diagnosis Date  . Diabetes mellitus without complication ([place])   . Hyperlipidemia 01/30/2016  . Hypertension 2007  . Respiratory crackles at left lung base 05/19/2017   CXR 02/2017 in [facility] normal     Family History  Problem Relation Age of Onset  . Hyperlipidemia Sister   . Diabetes Sister   . Hypertension Sister   . Heart disease Brother   . Healthy Son   . Leukemia Brother      Current Outpatient Medications:  .  aspirin EC 81 MG tablet, Take 1 tablet (81 mg total) by mouth daily., Disp: , Rfl:  .  cholecalciferol (VITAMIN D) 1000 units tablet, Take 1,000 Units by mouth daily., Disp: , Rfl:  .  denosumab (PROLIA) 60 MG/ML SOSY injection, Inject 60 mg into the skin every 6 (six) months., Disp: 1 mL, Rfl: 0 .  glucose blood (AGAMATRIX PRESTO TEST) test strip, Use as directed to check blood sugars 2 times per day dx:e11.22, Disp: 100 each, Rfl: 3 .  metFORMIN (GLUCOPHAGE) 1000 MG tablet, TAKE 1 TABLET BY MOUTH TWICE A DAY, Disp: 180 tablet, Rfl: 2 .  Multiple Vitamins-Minerals (ONE-A-DAY WOMENS 50+ ADVANTAGE PO), Take 1 tablet by mouth daily at 12 noon., Disp: , Rfl:  .  atorvastatin (LIPITOR) 20 MG tablet, Take 1 tablet (20 mg total) by mouth daily., Disp: 90 tablet, Rfl: 2 .  Lancets 33G MISC, by Does not apply route. Use as directed to check blood sugars 2 times per day dx:e11.22, Disp: , Rfl:  .  olmesartan-hydrochlorothiazide (BENICAR HCT) 40-12.5 MG tablet, Take 1 tablet by mouth daily., Disp: 90 tablet, Rfl: 1   No Known Allergies  Review of Systems  Constitutional: Negative.   Eyes: Negative for blurred vision.  Respiratory: Negative.   Cardiovascular: Negative.  Negative for palpitations.  Gastrointestinal: Negative.   Neurological: Negative.   Psychiatric/Behavioral: Negative.      Today's Vitals   05/30/20 0907  BP: 110/60  Pulse: 78  Temp: 98.1 F (36.7 C)  TempSrc: Oral  Weight: 128 lb 12.8 oz (58.4 kg)  Height: 5\' 1"  (1.549 m)  PainSc:  0-No pain   Body mass index is 24.34 kg/m.   Objective:  Physical Exam Vitals and nursing note reviewed.  Constitutional:      Appearance: Normal appearance.  HENT:     Head: Normocephalic and atraumatic.  Cardiovascular:     Rate and Rhythm: Normal rate and regular rhythm.     Heart sounds: Normal heart sounds.  Pulmonary:     Effort: Pulmonary effort is normal.     Breath sounds: Normal breath sounds.  Skin:    General: Skin is warm.  Neurological:     General: No focal deficit present.     Mental Status: She is alert.  Psychiatric:        Mood and Affect: Mood normal.        Behavior: Behavior normal.         Assessment And Plan:     1. Essential hypertension Comments: Chronic, well controlled. She will continue with current meds. She is encouraged to follow low sodium diet.   2. Type 2 diabetes mellitus with complication, without long-term current use of insulin (HCC) Comments: Chronic. I will check an a1c today. I will send rx for glucometer/test strips and lancets as requested. She will rto in four months for re-evaluation. The duration of this appointment visit was 30 minutes of face-to-face time with the patient.  Greater than 50% of this time was spent in counseling, explanation of diagnosis, planning of further management, and coordination of care.  Greater time was needed while working with the interpreter. All questions were answered to her satisfaction.   - Hemoglobin A1c  3. Pure hypercholesterolemia Comments: Chronic, previous results reviewed. Her LDL is at goal. She will continue with current meds. CMP performed Oct 2021 by Rheum, will not recheck today.  4. Normocytic anemia Comments: I will check iron studies today. She denies rectal and gingival bleeding.  - CBC no Diff - Iron and IBC ([id number]) - Ferritin     Patient was given opportunity to ask questions. Patient verbalized understanding of the plan and was able to repeat key elements of  the plan. All questions were answered to their satisfaction.  [name], MD   I, [name], MD, have reviewed all documentation for this visit. The documentation on 05/30/20 for the exam, diagnosis, procedures, and orders are all accurate and complete.  THE PATIENT IS ENCOURAGED TO PRACTICE SOCIAL DISTANCING DUE TO THE COVID-19 PANDEMIC.

## 2020-05-30 NOTE — Patient Instructions (Signed)

## 2020-05-31 ENCOUNTER — Other Ambulatory Visit: Payer: Self-pay

## 2020-05-31 LAB — CBC
Hematocrit: 31.6 % — ABNORMAL LOW (ref 34.0–46.6)
Hemoglobin: 10.6 g/dL — ABNORMAL LOW (ref 11.1–15.9)
MCH: 31.5 pg (ref 26.6–33.0)
MCHC: 33.5 g/dL (ref 31.5–35.7)
MCV: 94 fL (ref 79–97)
Platelets: 217 10*3/uL (ref 150–450)
RBC: 3.37 x10E6/uL — ABNORMAL LOW (ref 3.77–5.28)
RDW: 12 % (ref 11.7–15.4)
WBC: 4.7 10*3/uL (ref 3.4–10.8)

## 2020-05-31 LAB — IRON AND TIBC
Iron Saturation: 24 % (ref 15–55)
Iron: 64 ug/dL (ref 27–139)
Total Iron Binding Capacity: 263 ug/dL (ref 250–450)
UIBC: 199 ug/dL (ref 118–369)

## 2020-05-31 LAB — HEMOGLOBIN A1C
Est. average glucose Bld gHb Est-mCnc: 126 mg/dL
Hgb A1c MFr Bld: 6 % — ABNORMAL HIGH (ref 4.8–5.6)

## 2020-05-31 LAB — FERRITIN: Ferritin: 255 ng/mL — ABNORMAL HIGH (ref 15–150)

## 2020-05-31 MED ORDER — ACCU-CHEK FASTCLIX LANCETS MISC: 2 refills | Status: DC

## 2020-05-31 MED ORDER — ACCU-CHEK GUIDE W/DEVICE KIT: PACK | 1 refills | Status: AC

## 2020-05-31 MED ORDER — ACCU-CHEK GUIDE VI STRP: ORAL_STRIP | 2 refills | Status: DC

## 2020-07-13 DIAGNOSIS — E113293 Type 2 diabetes mellitus with mild nonproliferative diabetic retinopathy without macular edema, bilateral: Secondary | ICD-10-CM | POA: Diagnosis not present

## 2020-08-11 ENCOUNTER — Other Ambulatory Visit: Payer: Self-pay | Admitting: Internal Medicine

## 2020-08-11 DIAGNOSIS — Z1231 Encounter for screening mammogram for malignant neoplasm of breast: Secondary | ICD-10-CM

## 2020-09-05 ENCOUNTER — Other Ambulatory Visit: Payer: Self-pay | Admitting: Rheumatology

## 2020-09-05 DIAGNOSIS — Z79899 Other long term (current) drug therapy: Secondary | ICD-10-CM

## 2020-09-05 DIAGNOSIS — M810 Age-related osteoporosis without current pathological fracture: Secondary | ICD-10-CM

## 2020-09-05 NOTE — Telephone Encounter (Signed)
Next Visit: 10/05/2020  Last Visit: 03/30/2020  Last Fill: 03/14/2020  DX: Age-related osteoporosis without current pathological fracture   Current Dose per office note 03/30/2020,  Labs: 05/30/2020, RBC 3.37, hemoglobin 10.6, hematocrit 31.6, A1C 6.0, Ferritin 255, 03/30/2020 CMP is normal. Vitamin D 03/07/2020 All the labs are normal. It is okay for her to start on Prolia.  Last Prolia injection 03/31/2020  Okay to refill Prolia?

## 2020-09-06 ENCOUNTER — Other Ambulatory Visit: Payer: Self-pay | Admitting: Rheumatology

## 2020-09-06 NOTE — Telephone Encounter (Signed)
FYI, I called patient's sister who verbalized understanding, patient will come for labs 09/14/2020 at 1:30, patient has appt 10/05/2020 for appt and Prolia injection.

## 2020-09-06 NOTE — Telephone Encounter (Signed)
She will need her labs CMP with GFR within 1 month of Prolia dose.

## 2020-09-12 ENCOUNTER — Other Ambulatory Visit (HOSPITAL_COMMUNITY): Payer: Self-pay

## 2020-09-12 ENCOUNTER — Other Ambulatory Visit: Payer: Self-pay

## 2020-09-12 DIAGNOSIS — Z79899 Other long term (current) drug therapy: Secondary | ICD-10-CM

## 2020-09-12 DIAGNOSIS — M810 Age-related osteoporosis without current pathological fracture: Secondary | ICD-10-CM | POA: Diagnosis not present

## 2020-09-13 LAB — COMPLETE METABOLIC PANEL WITH GFR
AG Ratio: 1.3 (calc) (ref 1.0–2.5)
ALT: 19 U/L (ref 6–29)
AST: 20 U/L (ref 10–35)
Albumin: 4.8 g/dL (ref 3.6–5.1)
Alkaline phosphatase (APISO): 53 U/L (ref 37–153)
BUN/Creatinine Ratio: 28 (calc) — ABNORMAL HIGH (ref 6–22)
BUN: 29 mg/dL — ABNORMAL HIGH (ref 7–25)
CO2: 25 mmol/L (ref 20–32)
Calcium: 10.4 mg/dL (ref 8.6–10.4)
Chloride: 105 mmol/L (ref 98–110)
Creat: 1.03 mg/dL — ABNORMAL HIGH (ref 0.50–0.99)
GFR, Est African American: 65 mL/min/{1.73_m2} (ref >=60)
GFR, Est Non African American: 56 mL/min/{1.73_m2} — ABNORMAL LOW (ref >=60)
Globulin: 3.6 g/dL (calc) (ref 1.9–3.7)
Glucose, Bld: 125 mg/dL — ABNORMAL HIGH (ref 65–99)
Potassium: 5.4 mmol/L — ABNORMAL HIGH (ref 3.5–5.3)
Sodium: 138 mmol/L (ref 135–146)
Total Bilirubin: 0.4 mg/dL (ref 0.2–1.2)
Total Protein: 8.4 g/dL — ABNORMAL HIGH (ref 6.1–8.1)

## 2020-09-13 LAB — CBC WITH DIFFERENTIAL/PLATELET
Absolute Monocytes: 483 cells/uL (ref 200–950)
Basophils Absolute: 41 cells/uL (ref 0–200)
Basophils Relative: 0.6 %
Eosinophils Absolute: 231 cells/uL (ref 15–500)
Eosinophils Relative: 3.4 %
HCT: 34.4 % — ABNORMAL LOW (ref 35.0–45.0)
Hemoglobin: 10.9 g/dL — ABNORMAL LOW (ref 11.7–15.5)
Lymphs Abs: 2924 cells/uL (ref 850–3900)
MCH: 30 pg (ref 27.0–33.0)
MCHC: 31.7 g/dL — ABNORMAL LOW (ref 32.0–36.0)
MCV: 94.8 fL (ref 80.0–100.0)
MPV: 10.5 fL (ref 7.5–12.5)
Monocytes Relative: 7.1 %
Neutro Abs: 3121 cells/uL (ref 1500–7800)
Neutrophils Relative %: 45.9 %
Platelets: 240 10*3/uL (ref 140–400)
RBC: 3.63 10*6/uL — ABNORMAL LOW (ref 3.80–5.10)
RDW: 11.9 % (ref 11.0–15.0)
Total Lymphocyte: 43 %
WBC: 6.8 10*3/uL (ref 3.8–10.8)

## 2020-09-13 LAB — VITAMIN D 25 HYDROXY (VIT D DEFICIENCY, FRACTURES): Vit D, 25-Hydroxy: 61 ng/mL (ref 30–100)

## 2020-09-13 NOTE — Progress Notes (Signed)
Vitamin D and calcium are normal.  Glucose is elevated.  Creatinine is elevated.  Potassium is high.  Anemia noted.  I will forward labs to Dr. Baird Cancer.

## 2020-09-14 ENCOUNTER — Other Ambulatory Visit: Payer: Self-pay | Admitting: Internal Medicine

## 2020-09-14 MED FILL — PROLIA 60 MG/ML SOLN: 60 | 180 days supply | Qty: 1 | Fill #0

## 2020-09-15 ENCOUNTER — Telehealth: Payer: Self-pay | Admitting: Pharmacist

## 2020-09-15 NOTE — Telephone Encounter (Signed)
[facility] will courier new Prolia rx for patient to today, 09/15/20.   Knox Saliva, PharmD, MPH Clinical Pharmacist (Rheumatology and Pulmonology)

## 2020-09-15 NOTE — Telephone Encounter (Signed)
Prolia shipped to patient's home by mistake. Patient scheduled for Prolia on Monday, 09/18/20 @ 2pm with nurse for injection. She can get repeat CMP at f/u visit with Dr. Estanislado Pandy on 10/05/20 to check kidney function and calcium.  Spoke with pt via interpreter [phone] - she will bring medication shipped to her home to clinic visit. Apologized for mistake and confusion. Patient has placed medication in the refrigerator and advised to leave in fridge and bring with her to injection appointment.  Knox Saliva, PharmD, MPH Clinical Pharmacist (Rheumatology and Pulmonology)

## 2020-09-17 ENCOUNTER — Other Ambulatory Visit (HOSPITAL_COMMUNITY): Payer: Self-pay

## 2020-09-18 ENCOUNTER — Ambulatory Visit
Admission: RE | Admit: 2020-09-18 | Discharge: 2020-09-18 | Disposition: A | Discharge status: Home / Self Care | Payer: Medicare Other | Source: Ambulatory Visit

## 2020-09-18 ENCOUNTER — Other Ambulatory Visit: Payer: Self-pay

## 2020-09-18 ENCOUNTER — Ambulatory Visit (INDEPENDENT_AMBULATORY_CARE_PROVIDER_SITE_OTHER): Payer: Medicare Other

## 2020-09-18 VITALS — BP 120/69 | HR 85

## 2020-09-18 DIAGNOSIS — M810 Age-related osteoporosis without current pathological fracture: Secondary | ICD-10-CM | POA: Diagnosis not present

## 2020-09-18 DIAGNOSIS — Z1231 Encounter for screening mammogram for malignant neoplasm of breast: Secondary | ICD-10-CM | POA: Diagnosis not present

## 2020-09-18 MED ORDER — DENOSUMAB 60 MG/ML ~~LOC~~ SOSY
60.0000 mg | PREFILLED_SYRINGE | Freq: Once | SUBCUTANEOUS | Status: AC
  Administered 2020-09-18: 60 mg via SUBCUTANEOUS

## 2020-09-18 NOTE — Progress Notes (Signed)
Pharmacy Note  Subjective:   Patient presents to clinic today to receive bi-annual dose of Prolia.  Patient running a fever or have signs/symptoms of infection? No  Patient currently on antibiotics for the treatment of infection? No  Patient had fall in the last 6 months?  Yes  If yes, did it require medical attention? Yes   Patient reports she did not break any bones.   Patient taking calcium 1200 mg daily through diet or supplement and at least 800 units vitamin D? Yes  Objective: CMP     Component Value Date/Time   NA 138 09/12/2020 1336   NA 140 01/20/2020 1245   K 5.4 (H) 09/12/2020 1336   CL 105 09/12/2020 1336   CO2 25 09/12/2020 1336   GLUCOSE 125 (H) 09/12/2020 1336   BUN 29 (H) 09/12/2020 1336   BUN 21 01/20/2020 1245   CREATININE 1.03 (H) 09/12/2020 1336   CALCIUM 10.4 09/12/2020 1336   PROT 8.4 (H) 09/12/2020 1336   PROT 7.9 01/20/2020 1245   ALBUMIN 4.6 01/20/2020 1245   AST 20 09/12/2020 1336   ALT 19 09/12/2020 1336   ALKPHOS 82 01/20/2020 1245   BILITOT 0.4 09/12/2020 1336   BILITOT 0.4 01/20/2020 1245   GFRNONAA 56 (L) 09/12/2020 1336   GFRAA 65 09/12/2020 1336    CBC    Component Value Date/Time   WBC 6.8 09/12/2020 1336   RBC 3.63 (L) 09/12/2020 1336   HGB 10.9 (L) 09/12/2020 1336   HGB 10.6 (L) 05/30/2020 1050   HCT 34.4 (L) 09/12/2020 1336   HCT 31.6 (L) 05/30/2020 1050   PLT 240 09/12/2020 1336   PLT 217 05/30/2020 1050   MCV 94.8 09/12/2020 1336   MCV 94 05/30/2020 1050   MCH 30.0 09/12/2020 1336   MCHC 31.7 (L) 09/12/2020 1336   RDW 11.9 09/12/2020 1336   RDW 12.0 05/30/2020 1050   LYMPHSABS 2,924 09/12/2020 1336   LYMPHSABS 3.9 (H) 11/18/2016 0936   EOSABS 231 09/12/2020 1336   EOSABS 0.6 (H) 11/18/2016 0936   BASOSABS 41 09/12/2020 1336   BASOSABS 0.0 11/18/2016 0936    Lab Results  Component Value Date   VD25OH 61 09/12/2020    T-score: 11/02/2019 -3.3  Assessment/Plan:  Administrations This Visit    denosumab  (PROLIA) injection 60 mg    Admin Date 09/18/2020 Action Given Dose 60 mg Route Subcutaneous Administered By Earnestine Mealing, CMA         Patient tolerated injection well.    All questions encouraged and answered.  Instructed patient to call with any further questions or concerns.

## 2020-09-22 NOTE — Progress Notes (Signed)
Office Visit Note  Patient: Cindy Ross             Date of Birth: 09/07/1950           MRN: [id number]             PCP: Glendale Chard, MD Referring: Glendale Chard, MD Visit Date: 10/05/2020 Occupation: @GUAROCC @  Interpreter: Jeannene Patella  Subjective:  Medication monitoring.   History of Present Illness: Cindy Ross is a 70 y.o. female with a history of osteoporosis.  She had Prolia injection on September 18 2020.  She tolerated the injection well.  She states she is been taking calcium and vitamin D on a regular basis.  She denies any discomfort in her joints.  Activities of Daily Living:  Patient reports morning stiffness for 0 minutes.   Patient Denies nocturnal pain.  Difficulty dressing/grooming: Denies Difficulty climbing stairs: Denies Difficulty getting out of chair: Denies Difficulty using hands for taps, buttons, cutlery, and/or writing: Denies  Review of Systems  Constitutional: Negative for fatigue.  HENT: Negative for mouth sores, mouth dryness and nose dryness.   Eyes: Negative for pain, itching and dryness.  Respiratory: Negative for shortness of breath and difficulty breathing.   Cardiovascular: Negative for chest pain and palpitations.  Gastrointestinal: Negative for constipation and diarrhea.  Endocrine: Negative for increased urination.  Genitourinary: Negative for difficulty urinating.  Musculoskeletal: Negative for arthralgias, joint pain, joint swelling, myalgias, morning stiffness, muscle tenderness and myalgias.  Skin: Negative for color change, rash and redness.  Allergic/Immunologic: Negative for susceptible to infections.  Neurological: Negative for dizziness, numbness, headaches, memory loss and weakness.  Hematological: Negative for bruising/bleeding tendency.  Psychiatric/Behavioral: Negative for confusion.    PMFS History:  Patient Active Problem List   Diagnosis Date Noted  . BRBPR (bright red blood per rectum) 10/03/2020  . Normocytic anemia  10/03/2020  . Age-related osteoporosis without current pathological fracture 03/30/2020  . Language barrier 03/30/2020  . Respiratory crackles at left lung base 05/19/2017  . Essential hypertension 01/30/2016  . DM type 2 (diabetes mellitus, type 2) ([place]) 01/30/2016  . Hyperlipidemia 01/30/2016    Past Medical History:  Diagnosis Date  . Diabetes mellitus without complication ([place])   . Hyperlipidemia 01/30/2016  . Hypertension 2007  . Respiratory crackles at left lung base 05/19/2017   CXR 02/2017 in [facility] normal    Family History  Problem Relation Age of Onset  . Hyperlipidemia Sister   . Diabetes Sister   . Hypertension Sister   . Heart disease Brother   . Healthy Son   . Leukemia Brother    History reviewed. No pertinent surgical history. Social History   Social History Narrative   Originally from [place]   Came to [place]. In 28   Lives with younger sister, Cindy Ross   Son lives in [place].  Works in [employer].     Immunization History  Administered Date(s) Administered  . Fluad Quad(high Dose 65+) 04/18/2020  . Influenza Inj Mdck Quad Pf 07/31/2016, 05/19/2017  . Influenza, High Dose Seasonal PF 04/30/2018, 03/30/2019  . Influenza-Unspecified 04/17/2018, 03/30/2019  . PFIZER(Purple Top)SARS-COV-2 Vaccination 07/23/2019, 08/13/2019, 03/14/2020  . PNEUMOCOCCAL CONJUGATE-20 08/31/2020  . Pneumococcal Conjugate-13 09/14/2018, 12/29/2018  . Pneumococcal Polysaccharide-23 03/19/2016  . Td 10/31/1994, 04/10/1995  . Tdap 01/30/2016  . Zoster Recombinat (Shingrix) 05/06/2019, 07/12/2019     Objective: Vital Signs: BP 110/66 (BP Location: Left Arm, Patient Position: Sitting, Cuff Size: Normal)   Pulse 74  Resp 13   Ht 5\' 1"  (1.549 m)   Wt 131 lb 3.2 oz (59.5 kg)   BMI 24.79 kg/m    Physical Exam Vitals and nursing note reviewed.  Constitutional:      Appearance: She is well-developed.  HENT:     Head: Normocephalic and atraumatic.   Eyes:     Conjunctiva/sclera: Conjunctivae normal.  Cardiovascular:     Rate and Rhythm: Normal rate and regular rhythm.     Heart sounds: Normal heart sounds.  Pulmonary:     Effort: Pulmonary effort is normal.     Breath sounds: Normal breath sounds.  Abdominal:     General: Bowel sounds are normal.     Palpations: Abdomen is soft.  Musculoskeletal:     Cervical back: Normal range of motion.  Lymphadenopathy:     Cervical: No cervical adenopathy.  Skin:    General: Skin is warm and dry.     Capillary Refill: Capillary refill takes less than 2 seconds.  Neurological:     Mental Status: She is alert and oriented to person, place, and time.  Psychiatric:        Behavior: Behavior normal.      Musculoskeletal Exam: C-spine was in good range of motion.  She has thoracic kyphosis.  She had good range of motion of her shoulders, elbows, wrist joints.  She had bilateral PIP and DIP thickening with no synovitis.  Hip joints, knee joints, ankles, MTPs and PIPs with good range of motion with no synovitis.  CDAI Exam: CDAI Score: -- Patient Global: --; Provider Global: -- Swollen: --; Tender: -- Joint Exam 10/05/2020   No joint exam has been documented for this visit   There is currently no information documented on the homunculus. Go to the Rheumatology activity and complete the homunculus joint exam.  Investigation: No additional findings.  Imaging: MM 3D SCREEN BREAST BILATERAL  Result Date: 09/18/2020 CLINICAL DATA:  Screening. EXAM: DIGITAL SCREENING BILATERAL MAMMOGRAM WITH TOMOSYNTHESIS AND CAD TECHNIQUE: Bilateral screening digital craniocaudal and mediolateral oblique mammograms were obtained. Bilateral screening digital breast tomosynthesis was performed. The images were evaluated with computer-aided detection. COMPARISON:  Previous exam(s). ACR Breast Density Category c: The breast tissue is heterogeneously dense, which may obscure small masses. FINDINGS: There are no  findings suspicious for malignancy. The images were evaluated with computer-aided detection. IMPRESSION: No mammographic evidence of malignancy. A result letter of this screening mammogram will be mailed directly to the patient. RECOMMENDATION: Screening mammogram in one year. (Code:SM-B-01Y) BI-RADS CATEGORY  1: Negative. Electronically Signed   By: Lovey Newcomer M.D.   On: 09/18/2020 15:16    Recent Labs: Lab Results  Component Value Date   WBC 6.8 09/12/2020   HGB 10.9 (L) 09/12/2020   PLT 240 09/12/2020   NA 138 09/12/2020   K 5.4 (H) 09/12/2020   CL 105 09/12/2020   CO2 25 09/12/2020   GLUCOSE 125 (H) 09/12/2020   BUN 29 (H) 09/12/2020   CREATININE 1.03 (H) 09/12/2020   BILITOT 0.4 09/12/2020   ALKPHOS 82 01/20/2020   AST 20 09/12/2020   ALT 19 09/12/2020   PROT 8.4 (H) 09/12/2020   ALBUMIN 4.6 01/20/2020   CALCIUM 10.4 09/12/2020   GFRAA 65 09/12/2020    Speciality Comments: No specialty comments available.  Procedures:  No procedures performed Allergies: Patient has no known allergies.   Assessment / Plan:     Visit Diagnoses: Age-related osteoporosis without current pathological fracture - DEXA 11/02/19: BMD measured  at Femur Neck Right is 0.573 g/cm2 with a T-scoreof -3.3. last Prolia injection: 09/18/2020.  She tolerated the injection well without any side effects.  She has been taking calcium and vitamin D.  Calcium rich diet was emphasized.  She is also active and walks about 30 minutes to every day.  She also does gardening.  Primary osteoarthritis of both hands-she has DIP and PIP thickening in her hands with limited extension.  She denies any discomfort in her hands.  Joint protection was discussed.  Abnormal SPEP - SPEP showed elevated gammaglobulin.   Medication monitoring encounter-she had recent labs on September 12, 2020 she was mildly anemic.  Her anemia has been stable.  Iron rich diet was discussed.  Creatinine was slightly elevated.  I believe the creatinine is  elevated due to HCTZ use.  Her calcium was 10.4.  I do not see any need to repeat labs today.  Essential hypertension-her blood pressure is normal today.  Type 2 diabetes mellitus without complication, without long-term current use of insulin ([place])  Pure hypercholesterolemia  Language barrier-an interpreter was present during the visit.  Orders: No orders of the defined types were placed in this encounter.  No orders of the defined types were placed in this encounter.    Follow-Up Instructions: Return in about 5 months (around 03/07/2021) for Osteoporosis.   Bo Merino, MD  Note - This record has been created using Editor, commissioning.  Chart creation errors have been sought, but may not always  have been located. Such creation errors do not reflect on  the standard of medical care.

## 2020-10-03 ENCOUNTER — Other Ambulatory Visit: Payer: Self-pay

## 2020-10-03 ENCOUNTER — Ambulatory Visit (INDEPENDENT_AMBULATORY_CARE_PROVIDER_SITE_OTHER): Payer: Medicare Other | Admitting: Internal Medicine

## 2020-10-03 ENCOUNTER — Other Ambulatory Visit (HOSPITAL_COMMUNITY)
Admission: RE | Admit: 2020-10-03 | Discharge: 2020-10-03 | Disposition: A | Discharge status: Home / Self Care | Payer: Medicare Other | Source: Ambulatory Visit | Attending: Internal Medicine | Admitting: Internal Medicine

## 2020-10-03 ENCOUNTER — Encounter: Payer: Self-pay | Admitting: Internal Medicine

## 2020-10-03 VITALS — BP 134/70 | HR 73 | Temp 98.3°F | Ht 61.0 in | Wt 130.8 lb

## 2020-10-03 DIAGNOSIS — Z86010 Personal history of colonic polyps: Secondary | ICD-10-CM

## 2020-10-03 DIAGNOSIS — D649 Anemia, unspecified: Secondary | ICD-10-CM | POA: Diagnosis not present

## 2020-10-03 DIAGNOSIS — Z01419 Encounter for gynecological examination (general) (routine) without abnormal findings: Secondary | ICD-10-CM | POA: Diagnosis present

## 2020-10-03 DIAGNOSIS — E7800 Pure hypercholesterolemia, unspecified: Secondary | ICD-10-CM

## 2020-10-03 DIAGNOSIS — Z1151 Encounter for screening for human papillomavirus (HPV): Secondary | ICD-10-CM | POA: Insufficient documentation

## 2020-10-03 DIAGNOSIS — K625 Hemorrhage of anus and rectum: Secondary | ICD-10-CM | POA: Diagnosis not present

## 2020-10-03 DIAGNOSIS — I10 Essential (primary) hypertension: Secondary | ICD-10-CM | POA: Diagnosis not present

## 2020-10-03 DIAGNOSIS — E118 Type 2 diabetes mellitus with unspecified complications: Secondary | ICD-10-CM

## 2020-10-03 NOTE — Progress Notes (Signed)
I,Katawbba Wiggins,acting as a Education administrator for [name], MD.,have documented all relevant documentation on the behalf of [name], MD,as directed by  [name], MD while in the presence of [name], MD.  This visit occurred during the SARS-CoV-2 public health emergency.  Safety protocols were in place, including screening questions prior to the visit, additional usage of staff PPE, and extensive cleaning of exam room while observing appropriate contact time as indicated for disinfecting solutions.  Subjective:     Patient ID: Cindy Ross , female    DOB: 1950/11/30 , 70 y.o.   MRN: [id number]   Chief Complaint  Patient presents with  . Diabetes  . Hypertension    HPI  She presents today for bp/dm check.  She is accompanied by an interpreter, Marijean Bravo,  today. She has no specific concerns or complaints at this time. She reports compliance with meds. She denies headaches, chest pain and shortness of breath.   Diabetes She presents for her follow-up diabetic visit. She has type 2 diabetes mellitus. There are no hypoglycemic associated symptoms. Pertinent negatives for diabetes include no blurred vision. There are no hypoglycemic complications. She is compliant with treatment most of the time. She is following a diabetic diet. When asked about meal planning, she reported none. She participates in exercise intermittently. Her home blood glucose trend is fluctuating minimally. An ACE inhibitor/angiotensin II receptor blocker is being taken. Eye exam is not current.  Hypertension This is a chronic problem. The current episode started more than 1 year ago. The problem has been gradually improving since onset. The problem is controlled. Pertinent negatives include no blurred vision or palpitations. Risk factors for coronary artery disease include diabetes mellitus, dyslipidemia and post-menopausal state. The current treatment provides moderate improvement.     Past Medical  History:  Diagnosis Date  . Diabetes mellitus without complication ([place])   . Hyperlipidemia 01/30/2016  . Hypertension 2007  . Respiratory crackles at left lung base 05/19/2017   CXR 02/2017 in [facility] normal     Family History  Problem Relation Age of Onset  . Hyperlipidemia Sister   . Diabetes Sister   . Hypertension Sister   . Heart disease Brother   . Healthy Son   . Leukemia Brother      Current Outpatient Medications:  .  Accu-Chek FastClix Lancets MISC, : Use as directed to check blood sugars 2 times per day dx:e11.22, Disp: 300 each, Rfl: 2 .  ACCU-CHEK GUIDE test strip, : Use as directed to check blood sugars 2 times per day dx:e11.22, Disp: 300 each, Rfl: 2 .  aspirin EC 81 MG tablet, Take 1 tablet (81 mg total) by mouth daily., Disp: , Rfl:  .  atorvastatin (LIPITOR) 20 MG tablet, Take 1 tablet (20 mg total) by mouth daily., Disp: 90 tablet, Rfl: 2 .  Blood Glucose Monitoring Suppl (ACCU-CHEK GUIDE) w/Device KIT, : Use as directed to check blood sugars 2 times per day dx:e11.22, Disp: 1 kit, Rfl: 1 .  cholecalciferol (VITAMIN D) 1000 units tablet, Take 1,000 Units by mouth daily., Disp: , Rfl:  .  denosumab (PROLIA) 60 MG/ML SOSY injection, INJECT 60 MG INTO THE SKIN EVERY 6 (SIX) MONTHS., Disp: 1 mL, Rfl: 0 .  metFORMIN (GLUCOPHAGE) 1000 MG tablet, TAKE 1 TABLET BY MOUTH TWICE A DAY, Disp: 180 tablet, Rfl: 2 .  Multiple Vitamins-Minerals (ONE-A-DAY WOMENS 50+ ADVANTAGE PO), Take 1 tablet by mouth daily at 12 noon., Disp: ,  Rfl:  .  olmesartan-hydrochlorothiazide (BENICAR HCT) 40-12.5 MG tablet, TAKE 1 TABLET BY MOUTH EVERY DAY, Disp: 90 tablet, Rfl: 1   No Known Allergies   Review of Systems  Constitutional: Negative.   Eyes: Negative for blurred vision.  Respiratory: Negative.   Cardiovascular: Negative.  Negative for palpitations.  Gastrointestinal: Positive for blood in stool.       She c/o blood in her stools. Sx started two months ago. She denies  h/o abdominal  pain. She denies change in appetite.   Psychiatric/Behavioral: Negative.   All other systems reviewed and are negative.    Today's Vitals   10/03/20 0928  BP: 134/70  Pulse: 73  Temp: 98.3 F (36.8 C)  TempSrc: Oral  Weight: 130 lb 12.8 oz (59.3 kg)  Height: '5\' 1"'  (1.549 m)  PainSc: 0-No pain   Body mass index is 24.71 kg/m.  Wt Readings from Last 3 Encounters:  10/03/20 130 lb 12.8 oz (59.3 kg)  05/30/20 128 lb 12.8 oz (58.4 kg)  03/30/20 130 lb 9.6 oz (59.2 kg)   Objective:  Physical Exam Vitals and nursing note reviewed. Exam conducted with a chaperone present.  Constitutional:      Appearance: Normal appearance.  HENT:     Head: Normocephalic and atraumatic.     Nose:     Comments: Masked     Mouth/Throat:     Comments: Masked  Eyes:     Extraocular Movements: Extraocular movements intact.  Cardiovascular:     Rate and Rhythm: Normal rate and regular rhythm.     Heart sounds: Normal heart sounds.  Pulmonary:     Effort: Pulmonary effort is normal.     Breath sounds: Normal breath sounds.  Genitourinary:    Exam position: Lithotomy position.     Vagina: No vaginal discharge, erythema, tenderness or bleeding.     Cervix: Normal.     Uterus: Normal.      Adnexa: Right adnexa normal and left adnexa normal.     Rectum: Normal. Guaiac result negative. No tenderness. Normal anal tone.  Musculoskeletal:     Cervical back: Normal range of motion.  Skin:    General: Skin is warm.  Neurological:     General: No focal deficit present.     Mental Status: She is alert.  Psychiatric:        Mood and Affect: Mood normal.        Behavior: Behavior normal.         Assessment And Plan:     1. Type 2 diabetes mellitus with complication, without long-term current use of insulin (HCC) Comments: Chronic, I will check labs as listed below. Importance of dietary compliance was discussed with the patient.  - Hemoglobin A1c - Lipid panel  2. Essential  hypertension Comments: Chronic, fair control. She is encouraged to follow a low sodium diet.   3. BRBPR (bright red blood per rectum) Comments: DRE performed, stool heme negative. Previous colonoscopy reviewed 02/23/18 - has h/o polyps, advised to f/u in 3-5 years.   4. Pure hypercholesterolemia Comments: Chronic, she is currently taking atorvastatin 75m daily. I will check lipid panel and adjust meds as needed. Goal LDL is less than 70.  - Lipid panel  5. Normocytic anemia Comments: I will check CBC and iron studies today.  - Iron and IBC (([id number] - Ferritin  6. Cervical smear, as part of routine gynecological examination Comments: Pap smear performed, stool heme negative.  - Cytology -Pap Smear  7. History of colon polyps Comments: I will refer her to GI for CRC screening.  - Ambulatory referral to Gastroenterology  Patient was given opportunity to ask questions. Patient verbalized understanding of the plan and was able to repeat key elements of the plan. All questions were answered to their satisfaction.   I, [name], MD, have reviewed all documentation for this visit. The documentation on 10/03/20 for the exam, diagnosis, procedures, and orders are all accurate and complete.   IF YOU HAVE BEEN REFERRED TO A SPECIALIST, IT MAY TAKE 1-2 WEEKS TO SCHEDULE/PROCESS THE REFERRAL. IF YOU HAVE NOT HEARD FROM US/SPECIALIST IN TWO WEEKS, PLEASE GIVE [place] A CALL AT [phone] X [phone].   THE PATIENT IS ENCOURAGED TO PRACTICE SOCIAL DISTANCING DUE TO THE COVID-19 PANDEMIC.

## 2020-10-03 NOTE — Patient Instructions (Addendum)
B?nh ti?u ???ng v ch?m [place] bn chn Diabetes Mellitus and Foot Care Ch?m [place] bn chn l m?t ph?n quan tr?ng ??i v?i s?c kh?e c?a qu v?, ??c bi?t l khi qu v? b? ti?u ???ng. B?nh ti?u ???ng c th? khi?n qu v? g?p nhi?u v?n ?? v l?u l??ng mu (tu?n hon) km ? bn chn v c?ng chn, ?i?u ny c th? khi?n da c?a qu v?:  Tr? nn m?ng h?n v kh h?n.  D? n?t h?n.  Ch?m lnh h?n.  Bong trc v r?n. Qu v? c?ng c th? b? t?n th??ng dy th?n kinh (b?nh th?n kinh) ? c?ng chn v bn chn, gy gi?m c?m gic ? c?ng chn v bn chn ?. ?i?u ny ngh?a l qu v? c th? khng nh?n th?y nh?ng th??ng t?n nh? ? bn chn c kh? n?ng d?n ??n nh?ng v?n ?? nghim tr?ng h?n. Nh?n bi?t v gi?i quy?t s?m b?t k? v?n ?? ti?m ?n no l cch t?t nh?t ?? ng?n ng?a cc v?n ?? trong t??ng lai. Cch ch?m [place] bn chn qu v? V? sinh bn chn  R?a bn chn hng ngy b?ng n??c ?m v x phng d?u nh?Maggie Schwalbe s?? du?ng n???c nng. Sau ?, th?m nh? bn chn v cc khu v?c gi?a cc ngn chn cho ??n khi cc vng ? hon ton kh. Khng ngm chn v c th? lm kh da.  C?t t?a mng chn th?ng ngang qua. Khng mc bn d??i mng chn ho?c xung quanh l?p bi?u b. Gi?a mp mng b?ng m?t t?m ba c?ng ph? b?t mi ho?c cy gi?a mng.  Bi kem d??ng ?m ho?c vaselin vo da bn chn v vo cc mng chn gin, kh. S? d?ng kem d?ng l?ng khng ch?a c?n v khng mi. Khng bi kem d??ng gi?a cc ngn chn.   Giy v t?t  ?i t?t ho?c bt t?t di s?ch m?i ngy. Hy ch?c ch?n r?ng t?t khng qu ch?t. Khng ?i t?t cao ??n ??u g?i v chng c th? lm gi?m l?u l??ng mu ??n chn qu v?.  ?i giy v?a v?n v c ?? l?p ??m. Aldean Baker st giy tr??c khi qu v? ?i giy ?? ??m b?o khng c v?t g bn trong.  ?? ?i v?a giy m?i, hy ?i chng ch? vi gi? m?i ngy. ?i?u ny ng?n ng?a th??ng t?n trn bn chn c?a qu v?. V?t th??ng, v?t tr?y x??c, chai v s?n  Ki?m tra bn chn qu v? hng ngy xem m?n n??c, cc v?t c?t, v?t b?m tm, v?t lot v ??  hay khng. N?u qu v? khng th? nhn th?y lng bn chn, hy s? d?ng g??ng ho?c nh? ai ? gip ??Maggie Schwalbe c?t cc ch? s?n ho?c chai hay c? g?ng lo?i b? chng b?ng thu?c.  N?u qu v? th?y m?t v?t tr?y x??c, v?t c?t ho?c v?t n?t nh? trn da ? bn chn, hy gi? cho bn chn v da xung quanh s?ch v kh. Qu v? c th? lm s?ch nh?ng vng ny b?ng x phng d?u nh? v n??c. Khng lm s?ch vng ? b?ng peroxide, c?n ho?c i-?t.  N?u qu v? c v?t th??ng, v?t tr?y x??c, v?t s?n ho?c chai trn bn chn, hy ki?m tra n vi l?n m?i ngy ?? ??m b?o n ?ang lnh l?i v khng b? nhi?m trng. Ki?m tra xem c: ? ??, s?ng ho?c ?au. ? Di?ch ho?c mu. ? ?m. ? M? ho?c mi hi.   L??i [employer]  Khng b?t cho chn. ?i?u ny c th? lm gi?m l?u l??ng mu ??n bn chn qu v?.  Khng s? d?ng cc mi?ng ??m gia nhi?t ho?c chai n??c nng trn bn chn qu v?. Cc ?? v?t ny c th? lm b?ng da qu v?. N?u qu v? m?t c?m gic ? bn chn ho?c c?ng chn, qu v? c th? khng bi?t ?i?u ny ?ang x?y ra cho ??n khi qu mu?n.  B?o v? chn qu v? kh?i nng v l?nh b?ng cch ?i giy, ch?ng h?n nh? ? bi bi?n ho?c trn v?a h nng.  Ln l?ch khm bn chn ton di?n t nh?t m?t l?n m?i n?m (hng n?m) ho?c th??ng xuyn h?n n?u qu v? c v?n ?? v? bn chn. Bo co ngay l?p t?c b?t k? v?t c?t, v?t lot ho?c v?t b?m tm no cho chuyn gia ch?m [place] s?c kh?e. N?i tm thm thng tin  American Diabetes Association (Hi?p h?i Ti?u ????ng My?): www.diabetes.org  Association of Diabetes Care & Education Specialists (Hi?p h?i Chuyn gia Gio d?c Ch?m [place] B?nh ti?u ???ng): www.diabeteseducator.org Hy lin l?c v?i chuyn gia ch?m [place] s?c kh?e n?u:  Qu v? c m?t tnh tr?ng b?nh l lm t?ng nguy c? nhi?m trng v qu v? c b?t k? v?t c?t, v?t lot ho?c v?t b?m tm no trn bn chn.  Qu v? b? m?t th??ng t?n khng lnh.  Qu v? b? t?y ?? ? c?ng chn ho?c bn chn.  Qu v? c?m th?y nng rt ho?c ?au bu?t ? c?ng chn ho?c bn  chn.  Qu v? b? ?au ho?c co th?t ? c?ng chn v bn chn.  C?ng chn ho?c bn chn qu v? b? t.  Bn chn c?a qu v? lun c?m th?y l?nh.  Qu v? b? ?au xung quanh b?t k? mng chn no. Yu c?u tr? gip ngay l?p t?c n?u:  Qu v? c m?t v?t th??ng, tr?y x??c, s?n ho?c chai trn bn chn v: ? Qu v? b? ?au, s?ng ho?c t?y ?? tr?m tr?ng h?n. ? Qu v? c d?ch ho?c mu ch?y ra ? v?t th??ng, v?t tr?y x??c, v?t s?n ho?c chai. ? V?t th??ng, v?t tr?y x??c, v?t s?n ho?c chai c?a qu v? c?m th?y ?m nng khi ch?m vo. ? Qu v? c m? ho?c mi hi pht ra ? v?t th??ng, v?t tr?y x??c, v?t s?n ho?c chai. ? Qu v? b? s?t. ? Qu v? c m?t ???ng mu ?? ch?y ln c?ng chn. Tm t?t  Hy ki?m tra bn chn qu v? hng ngy xem c cc m?n n??c, cc v?t c?t, v?t b?m tm, ch? lot v ?? hay khng.  Bi kem d??ng ?m ho?c vaselin vo da bn chn v vo cc mng chn gin, kh.  ?i giy v?a v?n v c ?? l?p ??m.  N?u qu v? c v?n ?? v? bn chn, hy bo co ngay l?p t?c b?t k? v?t c?t, v?t lot ho?c v?t b?m tm no cho chuyn gia ch?m [place] s?c kh?e.  Ln l?ch khm bn chn ton di?n t nh?t m?t l?n m?i n?m (hng n?m) ho?c th??ng xuyn h?n n?u qu v? c v?n ?? v? bn chn. Thng tin ny khng nh?m m?c ?ch thay th? cho l?i khuyn m chuyn gia ch?m [place] s?c kh?e ni v?i qu v?. Hy b?o ??m qu v? ph?i th?o [place]?n b?t k? v?n ?? g m qu v? c v?i chuyn gia ch?m [place] s?c kh?e c?a qu v?. Document Revised: 01/24/2020  Document Reviewed: 01/24/2020 Elsevier Patient Education  [place].

## 2020-10-04 LAB — IRON AND TIBC
Iron Saturation: 21 % (ref 15–55)
Iron: 57 ug/dL (ref 27–139)
Total Iron Binding Capacity: 269 ug/dL (ref 250–450)
UIBC: 212 ug/dL (ref 118–369)

## 2020-10-04 LAB — LIPID PANEL
Chol/HDL Ratio: 2.9 ratio (ref 0.0–4.4)
Cholesterol, Total: 131 mg/dL (ref 100–199)
HDL: 45 mg/dL (ref >39)
LDL Chol Calc (NIH): 70 mg/dL (ref 0–99)
Triglycerides: 85 mg/dL (ref 0–149)
VLDL Cholesterol Cal: 16 mg/dL (ref 5–40)

## 2020-10-04 LAB — FERRITIN: Ferritin: 215 ng/mL — ABNORMAL HIGH (ref 15–150)

## 2020-10-04 LAB — HEMOGLOBIN A1C
Est. average glucose Bld gHb Est-mCnc: 131 mg/dL
Hgb A1c MFr Bld: 6.2 % — ABNORMAL HIGH (ref 4.8–5.6)

## 2020-10-05 ENCOUNTER — Encounter: Payer: Self-pay | Admitting: Rheumatology

## 2020-10-05 ENCOUNTER — Ambulatory Visit (INDEPENDENT_AMBULATORY_CARE_PROVIDER_SITE_OTHER): Payer: Medicare Other | Admitting: Rheumatology

## 2020-10-05 ENCOUNTER — Other Ambulatory Visit: Payer: Self-pay

## 2020-10-05 VITALS — BP 110/66 | HR 74 | Resp 13 | Ht 61.0 in | Wt 131.2 lb

## 2020-10-05 DIAGNOSIS — Z789 Other specified health status: Secondary | ICD-10-CM

## 2020-10-05 DIAGNOSIS — E7800 Pure hypercholesterolemia, unspecified: Secondary | ICD-10-CM

## 2020-10-05 DIAGNOSIS — M19042 Primary osteoarthritis, left hand: Secondary | ICD-10-CM | POA: Diagnosis not present

## 2020-10-05 DIAGNOSIS — M19041 Primary osteoarthritis, right hand: Secondary | ICD-10-CM

## 2020-10-05 DIAGNOSIS — I10 Essential (primary) hypertension: Secondary | ICD-10-CM | POA: Diagnosis not present

## 2020-10-05 DIAGNOSIS — R778 Other specified abnormalities of plasma proteins: Secondary | ICD-10-CM

## 2020-10-05 DIAGNOSIS — Z5181 Encounter for therapeutic drug level monitoring: Secondary | ICD-10-CM

## 2020-10-05 DIAGNOSIS — M810 Age-related osteoporosis without current pathological fracture: Secondary | ICD-10-CM | POA: Diagnosis not present

## 2020-10-05 DIAGNOSIS — E119 Type 2 diabetes mellitus without complications: Secondary | ICD-10-CM | POA: Diagnosis not present

## 2020-10-06 LAB — CYTOLOGY - PAP
Comment: NEGATIVE
Diagnosis: NEGATIVE
High risk HPV: NEGATIVE

## 2020-10-08 LAB — POC HEMOCCULT BLD/STL (OFFICE/1-CARD/DIAGNOSTIC): Fecal Occult Blood, POC: NEGATIVE

## 2020-11-07 ENCOUNTER — Encounter: Payer: Self-pay | Admitting: Internal Medicine

## 2020-11-27 ENCOUNTER — Other Ambulatory Visit: Payer: Self-pay | Admitting: Internal Medicine

## 2020-11-27 DIAGNOSIS — E782 Mixed hyperlipidemia: Secondary | ICD-10-CM

## 2021-01-09 DIAGNOSIS — E113292 Type 2 diabetes mellitus with mild nonproliferative diabetic retinopathy without macular edema, left eye: Secondary | ICD-10-CM | POA: Diagnosis not present

## 2021-01-18 DIAGNOSIS — K625 Hemorrhage of anus and rectum: Secondary | ICD-10-CM | POA: Diagnosis not present

## 2021-01-18 DIAGNOSIS — K5900 Constipation, unspecified: Secondary | ICD-10-CM | POA: Diagnosis not present

## 2021-01-18 DIAGNOSIS — Z1211 Encounter for screening for malignant neoplasm of colon: Secondary | ICD-10-CM | POA: Diagnosis not present

## 2021-01-18 DIAGNOSIS — Z86010 Personal history of colonic polyps: Secondary | ICD-10-CM | POA: Diagnosis not present

## 2021-01-31 ENCOUNTER — Encounter: Payer: Medicare Other | Admitting: Internal Medicine

## 2021-02-01 ENCOUNTER — Encounter: Payer: Medicare Other | Admitting: Internal Medicine

## 2021-02-15 LAB — HM HEPATITIS C SCREENING LAB: HM Hepatitis Screen: POSITIVE

## 2021-02-22 ENCOUNTER — Ambulatory Visit (INDEPENDENT_AMBULATORY_CARE_PROVIDER_SITE_OTHER): Payer: Medicare Other

## 2021-02-22 ENCOUNTER — Other Ambulatory Visit: Payer: Self-pay

## 2021-02-22 VITALS — BP 128/66 | HR 91 | Temp 98.5°F | Ht 60.8 in | Wt 134.2 lb

## 2021-02-22 DIAGNOSIS — E119 Type 2 diabetes mellitus without complications: Secondary | ICD-10-CM | POA: Diagnosis not present

## 2021-02-22 DIAGNOSIS — Z0000 Encounter for general adult medical examination without abnormal findings: Secondary | ICD-10-CM | POA: Diagnosis not present

## 2021-02-22 LAB — POCT URINALYSIS DIPSTICK
Bilirubin, UA: NEGATIVE
Blood, UA: NEGATIVE
Glucose, UA: NEGATIVE
Ketones, UA: NEGATIVE
Nitrite, UA: NEGATIVE
Protein, UA: NEGATIVE
Spec Grav, UA: 1.015 (ref 1.010–1.025)
Urobilinogen, UA: 0.2 E.U./dL
pH, UA: 5.5 (ref 5.0–8.0)

## 2021-02-22 NOTE — Patient Instructions (Signed)
Cindy Ross , Thank you for taking time to come for your Medicare Wellness Visit. I appreciate your ongoing commitment to your health goals. Please review the following plan we discussed and let me know if I can assist you in the future.   Screening recommendations/referrals: Colonoscopy: scheduled 03/19/2021 Mammogram: completed 09/18/2020 Bone Density: completed 11/02/2019 Recommended yearly ophthalmology/optometry visit for glaucoma screening and checkup Recommended yearly dental visit for hygiene and checkup  Vaccinations: Influenza vaccine: due Pneumococcal vaccine: completed 08/31/2020 Tdap vaccine: completed 01/30/2016, due 01/29/2026 Shingles vaccine: completed   Covid-19: 12/14/2020, 03/14/2020, 08/13/2019, 07/23/2019  Advanced directives: Advance directive discussed with you today. Even though you declined this today please call our office should you change your mind and we can give you the proper paperwork for you to fill out.  Conditions/risks identified: none  Next appointment: Follow up in one year for your annual wellness visit    Preventive Care 65 Years and Older, Female Preventive care refers to lifestyle choices and visits with your health care provider that can promote health and wellness. What does preventive care include? A yearly physical exam. This is also called an annual well check. Dental exams once or twice a year. Routine eye exams. Ask your health care provider how often you should have your eyes checked. Personal lifestyle choices, including: Daily care of your teeth and gums. Regular physical activity. Eating a healthy diet. Avoiding tobacco and drug use. Limiting alcohol use. Practicing safe sex. Taking low-dose aspirin every day. Taking vitamin and mineral supplements as recommended by your health care provider. What happens during an annual well check? The services and screenings done by your health care provider during your annual well check will depend on  your age, overall health, lifestyle risk factors, and family history of disease. Counseling  Your health care provider may ask you questions about your: Alcohol use. Tobacco use. Drug use. Emotional well-being. Home and relationship well-being. Sexual activity. Eating habits. History of falls. Memory and ability to understand (cognition). Work and work Statistician. Reproductive health. Screening  You may have the following tests or measurements: Height, weight, and BMI. Blood pressure. Lipid and cholesterol levels. These may be checked every 5 years, or more frequently if you are over 17 years old. Skin check. Lung cancer screening. You may have this screening every year starting at age 50 if you have a 30-pack-year history of smoking and currently smoke or have quit within the past 15 years. Fecal occult blood test (FOBT) of the stool. You may have this test every year starting at age 3. Flexible sigmoidoscopy or colonoscopy. You may have a sigmoidoscopy every 5 years or a colonoscopy every 10 years starting at age 55. Hepatitis C blood test. Hepatitis B blood test. Sexually transmitted disease (STD) testing. Diabetes screening. This is done by checking your blood sugar (glucose) after you have not eaten for a while (fasting). You may have this done every 1-3 years. Bone density scan. This is done to screen for osteoporosis. You may have this done starting at age 37. Mammogram. This may be done every 1-2 years. Talk to your health care provider about how often you should have regular mammograms. Talk with your health care provider about your test results, treatment options, and if necessary, the need for more tests. Vaccines  Your health care provider may recommend certain vaccines, such as: Influenza vaccine. This is recommended every year. Tetanus, diphtheria, and acellular pertussis (Tdap, Td) vaccine. You may need a Td booster every 10 years.  Zoster vaccine. You may need this  after age 77. Pneumococcal 13-valent conjugate (PCV13) vaccine. One dose is recommended after age 25. Pneumococcal polysaccharide (PPSV23) vaccine. One dose is recommended after age 11. Talk to your health care provider about which screenings and vaccines you need and how often you need them. This information is not intended to replace advice given to you by your health care provider. Make sure you discuss any questions you have with your health care provider. Document Released: 06/30/2015 Document Revised: 02/21/2016 Document Reviewed: 04/04/2015 Elsevier Interactive Patient Education  2017 [place] Prevention in the Home Falls can cause injuries. They can happen to people of all ages. There are many things you can do to make your home safe and to help prevent falls. What can I do on the outside of my home? Regularly fix the edges of walkways and driveways and fix any cracks. Remove anything that might make you trip as you walk through a door, such as a raised step or threshold. Trim any bushes or trees on the path to your home. Use bright outdoor lighting. Clear any walking paths of anything that might make someone trip, such as rocks or tools. Regularly check to see if handrails are loose or broken. Make sure that both sides of any steps have handrails. Any raised decks and porches should have guardrails on the edges. Have any leaves, snow, or ice cleared regularly. Use sand or salt on walking paths during winter. Clean up any spills in your garage right away. This includes oil or grease spills. What can I do in the bathroom? Use night lights. Install grab bars by the toilet and in the tub and shower. Do not use towel bars as grab bars. Use non-skid mats or decals in the tub or shower. If you need to sit down in the shower, use a plastic, non-slip stool. Keep the floor dry. Clean up any water that spills on the floor as soon as it happens. Remove soap buildup in the tub or  shower regularly. Attach bath mats securely with double-sided non-slip rug tape. Do not have throw rugs and other things on the floor that can make you trip. What can I do in the bedroom? Use night lights. Make sure that you have a light by your bed that is easy to reach. Do not use any sheets or blankets that are too big for your bed. They should not hang down onto the floor. Have a firm chair that has side arms. You can use this for support while you get dressed. Do not have throw rugs and other things on the floor that can make you trip. What can I do in the kitchen? Clean up any spills right away. Avoid walking on wet floors. Keep items that you use a lot in easy-to-reach places. If you need to reach something above you, use a strong step stool that has a grab bar. Keep electrical cords out of the way. Do not use floor polish or wax that makes floors slippery. If you must use wax, use non-skid floor wax. Do not have throw rugs and other things on the floor that can make you trip. What can I do with my stairs? Do not leave any items on the stairs. Make sure that there are handrails on both sides of the stairs and use them. Fix handrails that are broken or loose. Make sure that handrails are as long as the stairways. Check any carpeting to make sure that  it is firmly attached to the stairs. Fix any carpet that is loose or worn. Avoid having throw rugs at the top or bottom of the stairs. If you do have throw rugs, attach them to the floor with carpet tape. Make sure that you have a light switch at the top of the stairs and the bottom of the stairs. If you do not have them, ask someone to add them for you. What else can I do to help prevent falls? Wear shoes that: Do not have high heels. Have rubber bottoms. Are comfortable and fit you well. Are closed at the toe. Do not wear sandals. If you use a stepladder: Make sure that it is fully opened. Do not climb a closed stepladder. Make  sure that both sides of the stepladder are locked into place. Ask someone to hold it for you, if possible. Clearly mark and make sure that you can see: Any grab bars or handrails. First and last steps. Where the edge of each step is. Use tools that help you move around (mobility aids) if they are needed. These include: Canes. Walkers. Scooters. Crutches. Turn on the lights when you go into a dark area. Replace any light bulbs as soon as they burn out. Set up your furniture so you have a clear path. Avoid moving your furniture around. If any of your floors are uneven, fix them. If there are any pets around you, be aware of where they are. Review your medicines with your doctor. Some medicines can make you feel dizzy. This can increase your chance of falling. Ask your doctor what other things that you can do to help prevent falls. This information is not intended to replace advice given to you by your health care provider. Make sure you discuss any questions you have with your health care provider. Document Released: 03/30/2009 Document Revised: 11/09/2015 Document Reviewed: 07/08/2014 Elsevier Interactive Patient Education  2017 [employer].

## 2021-02-22 NOTE — Progress Notes (Signed)
Office Visit Note  Patient: Cindy Ross             Date of Birth: [birth year]           MRN: [id number]             PCP: Glendale Chard, MD Referring: Glendale Chard, MD Visit Date: 03/08/2021 Occupation: '@GUAROCC'$ @  Subjective:  Discuss proceeding with next prolia injection   History of Present Illness: Cindy Ross is a 70 y.o. female with history of osteoporosis and osteoarthritis.  Her last Prolia injection was on 09/18/2020.  She tolerated the first Prolia injection without any side effects.  She denies any injection site reaction at that time.  She is due for her next Prolia injection in October 2022.  She continues to take vitamin D 1,000 units daily and a multivitamin with calcium.  She denies any recent falls or fractures.  She denies any increased joint pain or joint swelling.   She states that she has been experiencing some itching on both legs but is planning on following up with her PCP for further evaluation.  She denies any other new questions or concerns.  Activities of Daily Living:  Patient reports morning stiffness for 0.   Patient Denies nocturnal pain.  Difficulty dressing/grooming: Denies Difficulty climbing stairs: Denies Difficulty getting out of chair: Denies Difficulty using hands for taps, buttons, cutlery, and/or writing: Denies  Review of Systems  Constitutional:  Negative for fatigue.  HENT:  Negative for mouth sores, mouth dryness and nose dryness.   Eyes:  Negative for pain, visual disturbance and dryness.  Respiratory:  Negative for cough, hemoptysis, shortness of breath and difficulty breathing.   Cardiovascular:  Negative for chest pain, palpitations, hypertension and swelling in legs/feet.  Gastrointestinal:  Negative for blood in stool, constipation and diarrhea.  Genitourinary:  Negative for difficulty urinating and painful urination.  Musculoskeletal:  Positive for joint pain and joint pain. Negative for joint swelling, myalgias, muscle weakness,  morning stiffness, muscle tenderness and myalgias.  Skin:  Positive for rash. Negative for color change, pallor, hair loss, nodules/bumps, skin tightness, ulcers and sensitivity to sunlight.  Allergic/Immunologic: Negative for susceptible to infections.  Neurological:  Negative for dizziness, numbness, headaches and weakness.  Hematological:  Negative for bruising/bleeding tendency and swollen glands.  Psychiatric/Behavioral:  Negative for depressed mood and sleep disturbance. The patient is not nervous/anxious.    PMFS History:  Patient Active Problem List   Diagnosis Date Noted   BRBPR (bright red blood per rectum) 10/03/2020   Normocytic anemia 10/03/2020   Age-related osteoporosis without current pathological fracture 03/30/2020   Language barrier 03/30/2020   Respiratory crackles at left lung base 05/19/2017   Essential hypertension 01/30/2016   DM type 2 (diabetes mellitus, type 2) ([place]) 01/30/2016   Hyperlipidemia 01/30/2016    Past Medical History:  Diagnosis Date   Diabetes mellitus without complication ([place])    Hyperlipidemia 01/30/2016   Hypertension 2007   Respiratory crackles at left lung base 05/19/2017   CXR 02/2017 in Care Every[facility] normal    Family History  Problem Relation Age of Onset   Hyperlipidemia Sister    Diabetes Sister    Hypertension Sister    Heart disease Brother    Healthy Son    Leukemia Brother    History reviewed. No pertinent surgical history. Social History   Social History Narrative   Originally from [place]   Came to [place]. In 49   Lives with younger sister, [name]  [place]   Son lives in [place].  Works in [employer].     Immunization History  Administered Date(s) Administered   Fluad Quad(high Dose 65+) 04/18/2020   Influenza Inj Mdck Quad Pf 07/31/2016, 05/19/2017   Influenza, High Dose Seasonal PF 04/30/2018, 03/30/2019   Influenza-Unspecified 04/17/2018, 03/30/2019   PFIZER Comirnaty(Gray Top)Covid-19 Tri-Sucrose  Vaccine 12/14/2020   PFIZER(Purple Top)SARS-COV-2 Vaccination 07/23/2019, 08/13/2019, 03/14/2020   PNEUMOCOCCAL CONJUGATE-20 08/31/2020   Pneumococcal Conjugate-13 09/14/2018, 12/29/2018   Pneumococcal Polysaccharide-23 03/19/2016   Td 10/31/1994, 04/10/1995   Tdap 01/30/2016   Zoster Recombinat (Shingrix) 05/06/2019, 07/12/2019     Objective: Vital Signs: BP 131/72 (BP Location: Left Arm, Patient Position: Sitting, Cuff Size: Normal)   Pulse 76   Resp 16   Ht '5\' 1"'$  (1.549 m)   Wt 135 lb (61.2 kg)   BMI 25.51 kg/m    Physical Exam Vitals and nursing note reviewed.  Constitutional:      Appearance: She is well-developed.  HENT:     Head: Normocephalic and atraumatic.  Eyes:     Conjunctiva/sclera: Conjunctivae normal.  Pulmonary:     Effort: Pulmonary effort is normal.  Abdominal:     Palpations: Abdomen is soft.  Musculoskeletal:     Cervical back: Normal range of motion.  Skin:    General: Skin is warm and dry.     Capillary Refill: Capillary refill takes less than 2 seconds.  Neurological:     Mental Status: She is alert and oriented to person, place, and time.  Psychiatric:        Behavior: Behavior normal.     Musculoskeletal Exam: C-spine, thoracic spine, and lumbar spine have good ROM.  No midline spinal tenderness or SI joint tenderness.  Shoulder joints, elbow joints, and wrist joints have good range of motion with no discomfort.  PIP and DIP thickening consistent with osteoarthritis of both hands.  Limited extension of PIP joints.  No tenderness or synovitis over MCP joints.  Hip joints have good range of motion with no discomfort.  Knee joints have good range of motion with no warmth or effusion.  Ankle joints have good range of motion without tenderness or joint swelling.  CDAI Exam: CDAI Score: -- Patient Global: --; Provider Global: -- Swollen: --; Tender: -- Joint Exam 03/08/2021   No joint exam has been documented for this visit   There is currently  no information documented on the homunculus. Go to the Rheumatology activity and complete the homunculus joint exam.  Investigation: No additional findings.  Imaging: No results found.  Recent Labs: Lab Results  Component Value Date   WBC 6.8 09/12/2020   HGB 10.9 (L) 09/12/2020   PLT 240 09/12/2020   NA 138 09/12/2020   K 5.4 (H) 09/12/2020   CL 105 09/12/2020   CO2 25 09/12/2020   GLUCOSE 125 (H) 09/12/2020   BUN 29 (H) 09/12/2020   CREATININE 1.03 (H) 09/12/2020   BILITOT 0.4 09/12/2020   ALKPHOS 82 01/20/2020   AST 20 09/12/2020   ALT 19 09/12/2020   PROT 8.4 (H) 09/12/2020   ALBUMIN 4.6 01/20/2020   CALCIUM 10.4 09/12/2020   GFRAA 65 09/12/2020    Speciality Comments: No specialty comments available.  Procedures:  No procedures performed Allergies: Patient has no known allergies.   Assessment / Plan:     Visit Diagnoses: Age-related osteoporosis without current pathological fracture - DEXA 11/02/19: BMD measured at Femur Neck Right is 0.573 g/cm2 with a T-score of -3.3.  Her last Prolia injection was on 09/18/2020.  She tolerated the Prolia injection without any side effects or an injection site reaction.  She has not had any falls or fractures.  No midline spinal tenderness or thoracic kyphosis noted.  She continues to take a multivitamin with calcium and vitamin D supplement 1000 units daily.  Vitamin D level will be checked today.  We will also check CBC and CMP prior to scheduling the Prolia injection.  She will be due to update her next DEXA in May 2023.  - Plan: COMPLETE METABOLIC PANEL WITH GFR, VITAMIN D 25 Hydroxy (Vit-D Deficiency, Fractures)  Vitamin D deficiency -Vitamin D was 61 on 09/12/20.  She has been taking vitamin D 1000 units daily. Vitamin D will be rechecked today prior to scheduling next prolia injection in October. Calcium was 10.4 on 09/12/20.  CMP ordered today. Plan: VITAMIN D 25 Hydroxy (Vit-D Deficiency, Fractures)  Medication monitoring  encounter -CBC and CMP were drawn today prior to her next Prolia injection.  Plan: CBC with Differential/Platelet, COMPLETE METABOLIC PANEL WITH GFR  Primary osteoarthritis of both hands: She has PIP and DIP thickening consistent with osteoarthritis of both hands.  Noted extension noted.  No tenderness or synovitis over MCP joints.  Discussed the importance of joint protection and muscle strengthening.  Other medical conditions are listed as follows:   Abnormal SPEP - SPEP showed elevated gammaglobulin.   Essential hypertension: BP was 131/72 today in the office.   Pure hypercholesterolemia  Type 2 diabetes mellitus without complication, without long-term current use of insulin ([place])  Language barrier: Translator present during entire examination.    Orders: Orders Placed This Encounter  Procedures   CBC with Differential/Platelet   COMPLETE METABOLIC PANEL WITH GFR   VITAMIN D 25 Hydroxy (Vit-D Deficiency, Fractures)   No orders of the defined types were placed in this encounter.     Follow-Up Instructions: Return in about 6 months (around 09/05/2021) for Osteoporosis, Osteoarthritis.   Ofilia Neas, PA-C  Note - This record has been created using Dragon software.  Chart creation errors have been sought, but may not always  have been located. Such creation errors do not reflect on  the standard of medical care.

## 2021-02-22 NOTE — Progress Notes (Signed)
This visit occurred during the SARS-CoV-2 public health emergency.  Safety protocols were in place, including screening questions prior to the visit, additional usage of staff PPE, and extensive cleaning of exam room while observing appropriate contact time as indicated for disinfecting solutions.  Subjective:   Cindy Ross is a 70 y.o. female who presents for Medicare Annual (Subsequent) preventive examination.  Review of Systems     Cardiac Risk Factors include: advanced age (>28men, >9 women);diabetes mellitus;dyslipidemia;hypertension     Objective:    Today's Vitals   02/22/21 0917  BP: 128/66  Pulse: 91  Temp: 98.5 F (36.9 C)  TempSrc: Oral  SpO2: 97%  Weight: 134 lb 3.2 oz (60.9 kg)  Height: 5' 0.8" (1.544 m)   Body mass index is 25.52 kg/m.  Advanced Directives 02/22/2021 01/20/2020 12/15/2018 11/18/2016  Does Patient Have a Medical Advance Directive? No No No No  Would patient like information on creating a medical advance directive? - No - Patient declined Yes (MAU/Ambulatory/Procedural Areas - Information given) Yes (MAU/Ambulatory/Procedural Areas - Information given)    Current Medications (verified) Outpatient Encounter Medications as of 02/22/2021  Medication Sig   Accu-Chek FastClix Lancets MISC : Use as directed to check blood sugars 2 times per day dx:e11.22   ACCU-CHEK GUIDE test strip : Use as directed to check blood sugars 2 times per day dx:e11.22   aspirin EC 81 MG tablet Take 1 tablet (81 mg total) by mouth daily.   atorvastatin (LIPITOR) 20 MG tablet TAKE 1 TABLET BY MOUTH EVERY DAY   Blood Glucose Monitoring Suppl (ACCU-CHEK GUIDE) w/Device KIT : Use as directed to check blood sugars 2 times per day dx:e11.22   cholecalciferol (VITAMIN D) 1000 units tablet Take 1,000 Units by mouth daily.   denosumab (PROLIA) 60 MG/ML SOSY injection INJECT 60 MG INTO THE SKIN EVERY 6 (SIX) MONTHS.   metFORMIN (GLUCOPHAGE) 1000 MG tablet TAKE 1 TABLET BY MOUTH TWICE A DAY    Multiple Vitamins-Minerals (ONE-A-DAY WOMENS 50+ ADVANTAGE PO) Take 1 tablet by mouth daily at 12 noon.   olmesartan-hydrochlorothiazide (BENICAR HCT) 40-12.5 MG tablet TAKE 1 TABLET BY MOUTH EVERY DAY   No facility-administered encounter medications on file as of 02/22/2021.    Allergies (verified) Patient has no known allergies.   History: Past Medical History:  Diagnosis Date   Diabetes mellitus without complication ([place])    Hyperlipidemia 01/30/2016   Hypertension 2007   Respiratory crackles at left lung base 05/19/2017   CXR 02/2017 in [facility] normal   History reviewed. No pertinent surgical history. Family History  Problem Relation Age of Onset   Hyperlipidemia Sister    Diabetes Sister    Hypertension Sister    Heart disease Brother    Healthy Son    Leukemia Brother    Social History   Socioeconomic History   Marital status: Widowed    Spouse name: Not on file   Number of children: 1   Years of education: 72   Highest education level: Not on file  Occupational History   Occupation: house cleaning.    Comment: Worked for bakery in past   Occupation: retired  Tobacco Use   Smoking status: Never   Smokeless tobacco: Never  Vaping Use   Vaping Use: Never used  Substance and Sexual Activity   Alcohol use: No    Alcohol/week: 0.0 standard drinks   Drug use: No   Sexual activity: Not Currently  Other Topics Concern   Not on file  Social History Narrative   Originally from [place]   Came to [place]. In 49   Lives with younger sister, Cindy Ross   Son lives in [place].  Works in [employer].     Social Determinants of Health   Financial Resource Strain: Low Risk    Difficulty of Paying Living Expenses: Not hard at all  Food Insecurity: No Food Insecurity   Worried About Charity fundraiser in the Last Year: Never true   [place] in the Last Year: Never true  Transportation Needs: No Transportation Needs   Lack of Transportation  (Medical): No   Lack of Transportation (Non-Medical): No  Physical Activity: Sufficiently Active   Days of Exercise per Week: 7 days   Minutes of Exercise per Session: 30 min  Stress: No Stress Concern Present   Feeling of Stress : Not at all  Social Connections: Not on file    Tobacco Counseling Counseling given: Not Answered   Clinical Intake:  Pre-visit preparation completed: Yes  Pain : No/denies pain     Nutritional Status: BMI 25 -29 Overweight Nutritional Risks: None Diabetes: Yes  How often do you need to have someone help you when you read instructions, pamphlets, or other written materials from your doctor or pharmacy?: 3 - Sometimes  Diabetic? Yes Nutrition Risk Assessment:  Has the patient had any N/V/D within the last 2 months?  No  Does the patient have any non-healing wounds?  No  Has the patient had any unintentional weight loss or weight gain?  No   Diabetes:  Is the patient diabetic?  Yes  If diabetic, was a CBG obtained today?  No  Did the patient bring in their glucometer from home?  No  How often do you monitor your CBG's? Twice weekly.   Financial Strains and Diabetes Management:  Are you having any financial strains with the device, your supplies or your medication? No .  Does the patient want to be seen by Chronic Care Management for management of their diabetes?  No  Would the patient like to be referred to a Nutritionist or for Diabetic Management?  No   Diabetic Exams:  Diabetic Eye Exam: Overdue for diabetic eye exam. Pt has been advised about the importance in completing this exam. Patient advised to call and schedule an eye exam. Diabetic Foot Exam: Overdue, Pt has been advised about the importance in completing this exam. Pt is scheduled for diabetic foot exam on next appointment.  Interpreter Needed?: Yes Interpreter Agency: [facility] Interpreter Name: Claiborne Billings  Information entered by :: NAllen LPN   Activities of Daily Living In  your present state of health, do you have any difficulty performing the following activities: 02/22/2021  Hearing? N  Vision? N  Difficulty concentrating or making decisions? N  Walking or climbing stairs? N  Dressing or bathing? N  Doing errands, shopping? N  Preparing Food and eating ? N  Using the Toilet? N  In the past six months, have you accidently leaked urine? N  Do you have problems with loss of bowel control? N  Managing your Medications? N  Managing your Finances? N  Housekeeping or managing your Housekeeping? N  Some recent data might be hidden    Patient Care Team: Glendale Chard, MD as PCP - General (Internal Medicine)  Indicate any recent Medical Services you may have received from other than [facility] providers in the past year (date may be approximate).     Assessment:  This is a routine wellness examination for [place].  Hearing/Vision screen Vision Screening - Comments:: Regular eye exams, Dr. Idolina Primer  Dietary issues and exercise activities discussed: Current Exercise Habits: Home exercise routine, Type of exercise: walking, Time (Minutes): 30, Frequency (Times/Week): 7, Weekly Exercise (Minutes/Week): 210   Goals Addressed             This Visit's Progress    Patient Stated       02/22/2021, no goals       Depression Screen PHQ 2/9 Scores 02/22/2021 01/20/2020 01/20/2020 12/15/2018 09/08/2018 06/02/2018 11/28/2015  PHQ - 2 Score 0 0 0 0 0 0 0  PHQ- 9 Score - 0 - 0 - - 0    Fall Risk Fall Risk  02/22/2021 01/20/2020 01/20/2020 05/06/2019 12/15/2018  Falls in the past year? _0 0 0  Comment missed a step - - - -  Number falls in past yr: - 1 0 - -  Injury with Fall? 0 0 1 - -  Comment - - patient fell when she went to make a step and she doesn't know why. - -  Risk for fall due to : Medication side effect History of fall(s);Medication side effect - - Medication side effect  Follow up Falls evaluation completed;Education provided;Falls prevention discussed Falls  evaluation completed;Education provided;Falls prevention discussed - - Falls evaluation completed;Education provided;Falls prevention discussed    FALL RISK PREVENTION PERTAINING TO THE HOME:  Any stairs in or around the home? Yes  If so, are there any without handrails? No  Home free of loose throw rugs in walkways, pet beds, electrical cords, etc? Yes  Adequate lighting in your home to reduce risk of falls? Yes   ASSISTIVE DEVICES UTILIZED TO PREVENT FALLS:  Life alert? No  Use of a cane, walker or w/c? No  Grab bars in the bathroom? Yes  Shower chair or bench in shower? Yes  Elevated toilet seat or a handicapped toilet? No   TIMED UP AND GO:  Was the test performed? No .    Gait steady and fast without use of assistive device  Cognitive Function: MMSE - Mini Mental State Exam 06/02/2018  Orientation to time 5  Orientation to Place 5  Attention/ Calculation 5  Language- name 2 objects 2  Language- repeat 1  Language- read & follow direction 1  Write a sentence 1  Copy design 1     6CIT Screen 06/02/2018  What Year? 0 points  What month? 0 points  What time? 0 points  Count back from 20 0 points  Months in reverse 0 points  Repeat phrase 0 points  Total Score 0    Immunizations Immunization History  Administered Date(s) Administered   Fluad Quad(high Dose 65+) 04/18/2020   Influenza Inj Mdck Quad Pf 07/31/2016, 05/19/2017   Influenza, High Dose Seasonal PF 04/30/2018, 03/30/2019   Influenza-Unspecified 04/17/2018, 03/30/2019   PFIZER Comirnaty(Gray Top)Covid-19 Tri-Sucrose Vaccine 12/14/2020   PFIZER(Purple Top)SARS-COV-2 Vaccination 07/23/2019, 08/13/2019, 03/14/2020   PNEUMOCOCCAL CONJUGATE-20 08/31/2020   Pneumococcal Conjugate-13 09/14/2018, 12/29/2018   Pneumococcal Polysaccharide-23 03/19/2016   Td 10/31/1994, 04/10/1995   Tdap 01/30/2016   Zoster Recombinat (Shingrix) 05/06/2019, 07/12/2019    TDAP status: Up to date  Flu Vaccine status: Due,  Education has been provided regarding the importance of this vaccine. Advised may receive this vaccine at local pharmacy or Health Dept. Aware to provide a copy of the vaccination record if obtained from local pharmacy or Health Dept. Verbalized acceptance and  understanding.  Pneumococcal vaccine status: Up to date  Covid-19 vaccine status: Completed vaccines  Qualifies for Shingles Vaccine? Yes   Zostavax completed No   Shingrix Completed?: Yes  Screening Tests Health Maintenance  Topic Date Due   INFLUENZA VACCINE  01/15/2021   OPHTHALMOLOGY EXAM  01/18/2021   FOOT EXAM  01/19/2021   HEMOGLOBIN A1C  04/04/2021   COVID-19 Vaccine (5 - Booster for Pfizer series) 04/15/2021   PNA vac Low Risk Adult (2 of 2 - PPSV23) 08/31/2021   COLONOSCOPY (Pts 45-22yr Insurance coverage will need to be confirmed)  03/05/2022   MAMMOGRAM  09/19/2022   TETANUS/TDAP  01/29/2026   DEXA SCAN  Completed   Hepatitis C Screening  Completed   Zoster Vaccines- Shingrix  Completed   HPV VACCINES  Aged Out    Health Maintenance  Health Maintenance Due  Topic Date Due   INFLUENZA VACCINE  01/15/2021   OPHTHALMOLOGY EXAM  01/18/2021   FOOT EXAM  01/19/2021    Colorectal cancer screening: scheduled 03/19/2021   Mammogram status: Completed 09/18/2020. Repeat every year  Bone Density status: Completed 11/02/2019.   Lung Cancer Screening: (Low Dose CT Chest recommended if Age 68-80years, 30 pack-year currently smoking OR have quit w/in 15years.) does not qualify.   Lung Cancer Screening Referral: no  Additional Screening:  Hepatitis C Screening: does qualify; Completed 11/18/2016  Vision Screening: Recommended annual ophthalmology exams for early detection of glaucoma and other disorders of the eye. Is the patient up to date with their annual eye exam?  No  Who is the provider or what is the name of the office in which the patient attends annual eye exams? Dr. DIdolina PrimerIf pt is not established with a  provider, would they like to be referred to a provider to establish care? No .   Dental Screening: Recommended annual dental exams for proper oral hygiene  Community Resource Referral / Chronic Care Management: CRR required this visit?  No   CCM required this visit?  No      Plan:     I have personally reviewed and noted the following in the patient's chart:   Medical and social history Use of alcohol, tobacco or illicit drugs  Current medications and supplements including opioid prescriptions.  Functional ability and status Nutritional status Physical activity Advanced directives List of other physicians Hospitalizations, surgeries, and ER visits in previous 12 months Vitals Screenings to include cognitive, depression, and falls Referrals and appointments  In addition, I have reviewed and discussed with patient certain preventive protocols, quality metrics, and best practice recommendations. A written personalized care plan for preventive services as well as general preventive health recommendations were provided to patient.     NKellie Simmering LPN   [id number]  Nurse Notes: 6 CIT not administered due to language barrier

## 2021-02-27 ENCOUNTER — Telehealth: Payer: Self-pay | Admitting: Pharmacist

## 2021-02-27 DIAGNOSIS — M810 Age-related osteoporosis without current pathological fracture: Secondary | ICD-10-CM

## 2021-02-27 DIAGNOSIS — Z79899 Other long term (current) drug therapy: Secondary | ICD-10-CM

## 2021-02-27 NOTE — Telephone Encounter (Signed)
Patient due for Prolia on 03/20/21. Last Prolia completed on 09/18/20  She has f/u appt on 03/08/21 with Hazel Sams, PA-C and can have Prolia labs drawn at that visit. Once labs result, Prolia will be couriered from [facility] and another appt will be scheduled for Prolia administration  Knox Saliva, PharmD, MPH, BCPS Clinical Pharmacist (Rheumatology and Pulmonology)

## 2021-03-08 ENCOUNTER — Encounter: Payer: Self-pay | Admitting: Physician Assistant

## 2021-03-08 ENCOUNTER — Ambulatory Visit (INDEPENDENT_AMBULATORY_CARE_PROVIDER_SITE_OTHER): Payer: Medicare Other | Admitting: Physician Assistant

## 2021-03-08 ENCOUNTER — Other Ambulatory Visit: Payer: Self-pay

## 2021-03-08 VITALS — BP 131/72 | HR 76 | Resp 16 | Ht 61.0 in | Wt 135.0 lb

## 2021-03-08 DIAGNOSIS — Z5181 Encounter for therapeutic drug level monitoring: Secondary | ICD-10-CM | POA: Diagnosis not present

## 2021-03-08 DIAGNOSIS — I10 Essential (primary) hypertension: Secondary | ICD-10-CM | POA: Diagnosis not present

## 2021-03-08 DIAGNOSIS — Z789 Other specified health status: Secondary | ICD-10-CM

## 2021-03-08 DIAGNOSIS — E559 Vitamin D deficiency, unspecified: Secondary | ICD-10-CM

## 2021-03-08 DIAGNOSIS — E7800 Pure hypercholesterolemia, unspecified: Secondary | ICD-10-CM | POA: Diagnosis not present

## 2021-03-08 DIAGNOSIS — M19042 Primary osteoarthritis, left hand: Secondary | ICD-10-CM

## 2021-03-08 DIAGNOSIS — M810 Age-related osteoporosis without current pathological fracture: Secondary | ICD-10-CM | POA: Diagnosis not present

## 2021-03-08 DIAGNOSIS — E119 Type 2 diabetes mellitus without complications: Secondary | ICD-10-CM | POA: Diagnosis not present

## 2021-03-08 DIAGNOSIS — M19041 Primary osteoarthritis, right hand: Secondary | ICD-10-CM

## 2021-03-08 DIAGNOSIS — R778 Other specified abnormalities of plasma proteins: Secondary | ICD-10-CM | POA: Diagnosis not present

## 2021-03-08 LAB — CBC WITH DIFFERENTIAL/PLATELET
Absolute Monocytes: 413 cells/uL (ref 200–950)
Basophils Absolute: 29 cells/uL (ref 0–200)
Basophils Relative: 0.6 %
Eosinophils Absolute: 240 cells/uL (ref 15–500)
Eosinophils Relative: 5 %
HCT: 34.9 % — ABNORMAL LOW (ref 35.0–45.0)
Hemoglobin: 11.2 g/dL — ABNORMAL LOW (ref 11.7–15.5)
Lymphs Abs: 2266 cells/uL (ref 850–3900)
MCH: 29.9 pg (ref 27.0–33.0)
MCHC: 32.1 g/dL (ref 32.0–36.0)
MCV: 93.1 fL (ref 80.0–100.0)
MPV: 11 fL (ref 7.5–12.5)
Monocytes Relative: 8.6 %
Neutro Abs: 1853 cells/uL (ref 1500–7800)
Neutrophils Relative %: 38.6 %
Platelets: 191 10*3/uL (ref 140–400)
RBC: 3.75 10*6/uL — ABNORMAL LOW (ref 3.80–5.10)
RDW: 12.4 % (ref 11.0–15.0)
Total Lymphocyte: 47.2 %
WBC: 4.8 10*3/uL (ref 3.8–10.8)

## 2021-03-08 LAB — COMPLETE METABOLIC PANEL WITH GFR
AG Ratio: 1.2 (calc) (ref 1.0–2.5)
ALT: 21 U/L (ref 6–29)
AST: 23 U/L (ref 10–35)
Albumin: 4.4 g/dL (ref 3.6–5.1)
Alkaline phosphatase (APISO): 55 U/L (ref 37–153)
BUN/Creatinine Ratio: 29 (calc) — ABNORMAL HIGH (ref 6–22)
BUN: 31 mg/dL — ABNORMAL HIGH (ref 7–25)
CO2: 24 mmol/L (ref 20–32)
Calcium: 9.7 mg/dL (ref 8.6–10.4)
Chloride: 108 mmol/L (ref 98–110)
Creat: 1.07 mg/dL — ABNORMAL HIGH (ref 0.50–1.05)
Globulin: 3.6 g/dL (calc) (ref 1.9–3.7)
Glucose, Bld: 102 mg/dL — ABNORMAL HIGH (ref 65–99)
Potassium: 4.9 mmol/L (ref 3.5–5.3)
Sodium: 140 mmol/L (ref 135–146)
Total Bilirubin: 0.4 mg/dL (ref 0.2–1.2)
Total Protein: 8 g/dL (ref 6.1–8.1)
eGFR: 57 mL/min/{1.73_m2} — ABNORMAL LOW (ref >=60)

## 2021-03-08 LAB — VITAMIN D 25 HYDROXY (VIT D DEFICIENCY, FRACTURES): Vit D, 25-Hydroxy: 57 ng/mL (ref 30–100)

## 2021-03-09 NOTE — Progress Notes (Signed)
Anemia noted which is improved.  GFR is low and stable.  Vitamin D is normal.

## 2021-03-12 ENCOUNTER — Other Ambulatory Visit (HOSPITAL_COMMUNITY): Payer: Self-pay

## 2021-03-12 MED ORDER — DENOSUMAB 60 MG/ML ~~LOC~~ SOSY
PREFILLED_SYRINGE | SUBCUTANEOUS | 0 refills | Status: DC
  Filled 2021-03-12: qty 1, 180d supply, fill #0

## 2021-03-12 NOTE — Telephone Encounter (Signed)
CBC, CMP drawn on 03/08/21 showed improved anemia and stable GFR. Vitamin D on 03/08/21 wnl  Rx for Prolia sent to [facility] to be couriered to clinic. Will schedule Prolia appt once medication is received from [facility]. Email sent to [facility] for med to be couriered as clinic patient advocate are OOO until Wednesday, 03/14/21  Knox Saliva, PharmD, MPH, BCPS Clinical Pharmacist (Rheumatology and Pulmonology)

## 2021-03-13 NOTE — Telephone Encounter (Addendum)
Prolia received from [facility]. Placed in refrigerator. Patient can be scheduled for Prolia visit on or after 03/20/21. Routing to Trisha Mangle, PharmD, MPH, BCPS Clinical Pharmacist (Rheumatology and Pulmonology)

## 2021-03-13 NOTE — Telephone Encounter (Signed)
Appointment scheduled for patient to received Prolia on 03/22/2021 at 2:00 pm.

## 2021-03-14 ENCOUNTER — Other Ambulatory Visit (HOSPITAL_COMMUNITY): Payer: Self-pay

## 2021-03-19 ENCOUNTER — Encounter: Payer: Self-pay | Admitting: Internal Medicine

## 2021-03-19 DIAGNOSIS — D122 Benign neoplasm of ascending colon: Secondary | ICD-10-CM | POA: Diagnosis not present

## 2021-03-19 DIAGNOSIS — K625 Hemorrhage of anus and rectum: Secondary | ICD-10-CM | POA: Diagnosis not present

## 2021-03-19 DIAGNOSIS — K635 Polyp of colon: Secondary | ICD-10-CM | POA: Diagnosis not present

## 2021-03-19 DIAGNOSIS — Z86010 Personal history of colonic polyps: Secondary | ICD-10-CM | POA: Diagnosis not present

## 2021-03-19 DIAGNOSIS — Z1211 Encounter for screening for malignant neoplasm of colon: Secondary | ICD-10-CM | POA: Diagnosis not present

## 2021-03-19 LAB — HM COLONOSCOPY

## 2021-03-20 ENCOUNTER — Encounter: Payer: Self-pay | Admitting: Internal Medicine

## 2021-03-22 ENCOUNTER — Ambulatory Visit (INDEPENDENT_AMBULATORY_CARE_PROVIDER_SITE_OTHER): Payer: Medicare Other | Admitting: *Deleted

## 2021-03-22 ENCOUNTER — Other Ambulatory Visit: Payer: Self-pay

## 2021-03-22 VITALS — BP 128/67 | HR 83

## 2021-03-22 DIAGNOSIS — M810 Age-related osteoporosis without current pathological fracture: Secondary | ICD-10-CM | POA: Diagnosis not present

## 2021-03-22 MED ORDER — DENOSUMAB 60 MG/ML ~~LOC~~ SOSY
60.0000 mg | PREFILLED_SYRINGE | Freq: Once | SUBCUTANEOUS | Status: AC
  Administered 2021-03-22: 60 mg via SUBCUTANEOUS

## 2021-03-22 NOTE — Progress Notes (Signed)
Pharmacy Note  Subjective:   Patient presents to clinic today to receive bi-annual dose of Prolia.  Patient running a fever or have signs/symptoms of infection? No  Patient currently on antibiotics for the treatment of infection? No  Patient had fall in the last 6 months?  No  If yes, did it require medical attention? No   Patient taking calcium 1200 mg daily through diet or supplement and at least 800 units vitamin D? Yes  Objective: CMP     Component Value Date/Time   NA 140 03/08/2021 0914   NA 140 01/20/2020 1245   K 4.9 03/08/2021 0914   CL 108 03/08/2021 0914   CO2 24 03/08/2021 0914   GLUCOSE 102 (H) 03/08/2021 0914   BUN 31 (H) 03/08/2021 0914   BUN 21 01/20/2020 1245   CREATININE 1.07 (H) 03/08/2021 0914   CALCIUM 9.7 03/08/2021 0914   PROT 8.0 03/08/2021 0914   PROT 7.9 01/20/2020 1245   ALBUMIN 4.6 01/20/2020 1245   AST 23 03/08/2021 0914   ALT 21 03/08/2021 0914   ALKPHOS 82 01/20/2020 1245   BILITOT 0.4 03/08/2021 0914   BILITOT 0.4 01/20/2020 1245   GFRNONAA 56 (L) 09/12/2020 1336   GFRAA 65 09/12/2020 1336    CBC    Component Value Date/Time   WBC 4.8 03/08/2021 0914   RBC 3.75 (L) 03/08/2021 0914   HGB 11.2 (L) 03/08/2021 0914   HGB 10.6 (L) 05/30/2020 1050   HCT 34.9 (L) 03/08/2021 0914   HCT 31.6 (L) 05/30/2020 1050   PLT 191 03/08/2021 0914   PLT 217 05/30/2020 1050   MCV 93.1 03/08/2021 0914   MCV 94 05/30/2020 1050   MCH 29.9 03/08/2021 0914   MCHC 32.1 03/08/2021 0914   RDW 12.4 03/08/2021 0914   RDW 12.0 05/30/2020 1050   LYMPHSABS 2,266 03/08/2021 0914   LYMPHSABS 3.9 (H) 11/18/2016 0936   EOSABS 240 03/08/2021 0914   EOSABS 0.6 (H) 11/18/2016 0936   BASOSABS 29 03/08/2021 0914   BASOSABS 0.0 11/18/2016 0936    Lab Results  Component Value Date   VD25OH 57 03/08/2021    T-score:  11/02/2019 -3.3  Assessment/Plan:   Administrations This Visit     denosumab (PROLIA) injection 60 mg     Admin Date 03/22/2021  Action Given Dose 60 mg Route Subcutaneous Administered By Carole Binning, LPN             Patient tolerated injection well.    All questions encouraged and answered.  Instructed patient to call with any further questions or concerns.

## 2021-04-04 LAB — HM DIABETES EYE EXAM

## 2021-04-12 ENCOUNTER — Encounter: Payer: Self-pay | Admitting: Internal Medicine

## 2021-04-26 ENCOUNTER — Encounter: Payer: Self-pay | Admitting: Nurse Practitioner

## 2021-04-26 ENCOUNTER — Ambulatory Visit (INDEPENDENT_AMBULATORY_CARE_PROVIDER_SITE_OTHER): Payer: Medicare Other | Admitting: Nurse Practitioner

## 2021-04-26 ENCOUNTER — Other Ambulatory Visit: Payer: Self-pay

## 2021-04-26 VITALS — BP 122/80 | HR 51 | Temp 98.1°F | Ht 61.0 in | Wt 136.0 lb

## 2021-04-26 DIAGNOSIS — I10 Essential (primary) hypertension: Secondary | ICD-10-CM | POA: Diagnosis not present

## 2021-04-26 DIAGNOSIS — Z0000 Encounter for general adult medical examination without abnormal findings: Secondary | ICD-10-CM | POA: Diagnosis not present

## 2021-04-26 DIAGNOSIS — K644 Residual hemorrhoidal skin tags: Secondary | ICD-10-CM | POA: Diagnosis not present

## 2021-04-26 DIAGNOSIS — L299 Pruritus, unspecified: Secondary | ICD-10-CM | POA: Diagnosis not present

## 2021-04-26 DIAGNOSIS — E1169 Type 2 diabetes mellitus with other specified complication: Secondary | ICD-10-CM

## 2021-04-26 DIAGNOSIS — D649 Anemia, unspecified: Secondary | ICD-10-CM

## 2021-04-26 DIAGNOSIS — E119 Type 2 diabetes mellitus without complications: Secondary | ICD-10-CM | POA: Diagnosis not present

## 2021-04-26 DIAGNOSIS — E7800 Pure hypercholesterolemia, unspecified: Secondary | ICD-10-CM | POA: Diagnosis not present

## 2021-04-26 LAB — POCT URINALYSIS DIPSTICK
Bilirubin, UA: NEGATIVE
Glucose, UA: NEGATIVE
Ketones, UA: NEGATIVE
Leukocytes, UA: NEGATIVE
Nitrite, UA: NEGATIVE
Protein, UA: NEGATIVE
Spec Grav, UA: 1.02 (ref 1.010–1.025)
Urobilinogen, UA: 0.2 E.U./dL
pH, UA: 5.5 (ref 5.0–8.0)

## 2021-04-26 LAB — CBC
Hematocrit: 32.5 % — ABNORMAL LOW (ref 34.0–46.6)
Hemoglobin: 10.6 g/dL — ABNORMAL LOW (ref 11.1–15.9)
MCH: 29.5 pg (ref 26.6–33.0)
MCHC: 32.6 g/dL (ref 31.5–35.7)
MCV: 91 fL (ref 79–97)
Platelets: 224 10*3/uL (ref 150–450)
RBC: 3.59 x10E6/uL — ABNORMAL LOW (ref 3.77–5.28)
RDW: 12.6 % (ref 11.7–15.4)
WBC: 4.6 10*3/uL (ref 3.4–10.8)

## 2021-04-26 LAB — CMP14+EGFR
ALT: 26 IU/L (ref 0–32)
AST: 28 IU/L (ref 0–40)
Albumin/Globulin Ratio: 1.4 (ref 1.2–2.2)
Albumin: 4.6 g/dL (ref 3.8–4.8)
Alkaline Phosphatase: 68 IU/L (ref 44–121)
BUN/Creatinine Ratio: 24 (ref 12–28)
BUN: 24 mg/dL (ref 8–27)
Bilirubin Total: 0.4 mg/dL (ref 0.0–1.2)
CO2: 22 mmol/L (ref 20–29)
Calcium: 9.7 mg/dL (ref 8.7–10.3)
Chloride: 109 mmol/L — ABNORMAL HIGH (ref 96–106)
Creatinine, Ser: 0.99 mg/dL (ref 0.57–1.00)
Globulin, Total: 3.3 g/dL (ref 1.5–4.5)
Glucose: 95 mg/dL (ref 70–99)
Potassium: 4.6 mmol/L (ref 3.5–5.2)
Sodium: 144 mmol/L (ref 134–144)
Total Protein: 7.9 g/dL (ref 6.0–8.5)
eGFR: 62 mL/min/{1.73_m2} (ref >59)

## 2021-04-26 LAB — HEMOGLOBIN A1C
Est. average glucose Bld gHb Est-mCnc: 134 mg/dL
Hgb A1c MFr Bld: 6.3 % — ABNORMAL HIGH (ref 4.8–5.6)

## 2021-04-26 LAB — POCT UA - MICROALBUMIN
Albumin/Creatinine Ratio, Urine, POC: <30
Creatinine, POC: 100 mg/dL
Microalbumin Ur, POC: 10 mg/L

## 2021-04-26 LAB — LIPID PANEL
Chol/HDL Ratio: 3.4 ratio (ref 0.0–4.4)
Cholesterol, Total: 131 mg/dL (ref 100–199)
HDL: 39 mg/dL — ABNORMAL LOW (ref >39)
LDL Chol Calc (NIH): 72 mg/dL (ref 0–99)
Triglycerides: 110 mg/dL (ref 0–149)
VLDL Cholesterol Cal: 20 mg/dL (ref 5–40)

## 2021-04-26 MED ORDER — ATORVASTATIN CALCIUM 20 MG PO TABS: 20.0000 mg | ORAL_TABLET | Freq: Every day | ORAL | 2 refills | Status: DC

## 2021-04-26 MED ORDER — HYDROCORTISONE 1 % EX CREA: TOPICAL_CREAM | CUTANEOUS | 1 refills | Status: DC

## 2021-04-26 MED ORDER — METFORMIN HCL 1000 MG PO TABS
1000.0000 mg | ORAL_TABLET | Freq: Two times a day (BID) | ORAL | 2 refills | Status: DC
Start: 2021-04-26 — End: 2022-02-28

## 2021-04-26 NOTE — Progress Notes (Signed)
I,Katawbba Wiggins,acting as a scribe for Ramandeep Ghumman, NP.,have documented all relevant documentation on the behalf of Ramandeep Ghumman, NP,as directed by  Ramandeep Ghumman, NP while in the presence of Ramandeep Ghumman, NP.  This visit occurred during the SARS-CoV-2 public health emergency.  Safety protocols were in place, including screening questions prior to the visit, additional usage of staff PPE, and extensive cleaning of exam room while observing appropriate contact time as indicated for disinfecting solutions.  Subjective:     Patient ID: Cindy Ross , female    DOB: 10/15/1950 , 70 y.o.   MRN: [id number]   Chief Complaint  Patient presents with   Annual Exam    HPI  Pt here for HM. She has a hemorrhoid concern, she states it does not hurt but it is irritant. She wants to know if she can use something OTC or prescribed.   Diet: normal  Exercise: she walks      Past Medical History:  Diagnosis Date   Diabetes mellitus without complication (HCC)    Hyperlipidemia 01/30/2016   Hypertension 2007   Respiratory crackles at left lung base 05/19/2017   CXR 02/2017 in Care Every[facility] normal     Family History  Problem Relation Age of Onset   Hyperlipidemia Sister    Diabetes Sister    Hypertension Sister    Heart disease Brother    Healthy Son    Leukemia Brother      Current Outpatient Medications:    Accu-Chek FastClix Lancets MISC, : Use as directed to check blood sugars 2 times per day dx:e11.22, Disp: 300 each, Rfl: 2   ACCU-CHEK GUIDE test strip, : Use as directed to check blood sugars 2 times per day dx:e11.22, Disp: 300 each, Rfl: 2   aspirin EC 81 MG tablet, Take 1 tablet (81 mg total) by mouth daily., Disp: , Rfl:    Blood Glucose Monitoring Suppl (ACCU-CHEK GUIDE) w/Device KIT, : Use as directed to check blood sugars 2 times per day dx:e11.22, Disp: 1 kit, Rfl: 1   cholecalciferol (VITAMIN D) 1000 units tablet, Take 1,000 Units by mouth daily., Disp:  , Rfl:    hydrocortisone cream 1 %, Apply to affected area 2 times daily, Disp: 30 g, Rfl: 1   Multiple Vitamins-Minerals (ONE-A-DAY WOMENS 50+ ADVANTAGE PO), Take 1 tablet by mouth daily at 12 noon., Disp: , Rfl:    olmesartan-hydrochlorothiazide (BENICAR HCT) 40-12.5 MG tablet, TAKE 1 TABLET BY MOUTH EVERY DAY, Disp: 90 tablet, Rfl: 1   atorvastatin (LIPITOR) 20 MG tablet, Take 1 tablet (20 mg total) by mouth daily., Disp: 30 tablet, Rfl: 2   CLENPIQ 10-3.5-12 MG-GM -GM/160ML SOLN, SMARTSIG:160 Milliliter(s) By Mouth As Directed (Patient not taking: Reported on 04/26/2021), Disp: , Rfl:    denosumab (PROLIA) 60 MG/ML SOSY injection, INJECT 60 MG INTO THE SKIN EVERY 6 (SIX) MONTHS. (Patient not taking: Reported on 04/26/2021), Disp: 1 mL, Rfl: 0   metFORMIN (GLUCOPHAGE) 1000 MG tablet, Take 1 tablet (1,000 mg total) by mouth 2 (two) times daily., Disp: 180 tablet, Rfl: 2   No Known Allergies    The patient states she uses none for birth control. Last LMP was No LMP recorded. Patient is postmenopausal.. Negative for Dysmenorrhea. Negative for: breast discharge, breast lump(s), breast pain and breast self exam. Associated symptoms include abnormal vaginal bleeding. Pertinent negatives include abnormal bleeding (hematology), anxiety, decreased libido, depression, difficulty falling sleep, dyspareunia, history of infertility, nocturia, sexual dysfunction, sleep disturbances, urinary incontinence, urinary urgency, vaginal   discharge and vaginal itching. Diet regular.The patient states her exercise level is    . The patient's tobacco use is:  Social History   Tobacco Use  Smoking Status Never  Smokeless Tobacco Never  . She has been exposed to passive smoke. The patient's alcohol use is:  Social History   Substance and Sexual Activity  Alcohol Use No   Alcohol/week: 0.0 standard drinks  . Additional information: Last pap 2022, next one scheduled for 2025.    Review of Systems  Constitutional:  Negative.  Negative for chills, fatigue and fever.  HENT: Negative.  Negative for sneezing.   Eyes: Negative.   Respiratory:  Negative for cough.   Cardiovascular: Negative.  Negative for chest pain and palpitations.  Gastrointestinal: Negative.   Endocrine: Negative.  Negative for polydipsia, polyphagia and polyuria.  Genitourinary: Negative.   Musculoskeletal: Negative.  Negative for arthralgias and myalgias.  Skin: Negative.        Itchy and has hemorrhoids   Allergic/Immunologic: Negative.   Neurological: Negative.  Negative for dizziness, weakness and headaches.  Hematological: Negative.   Psychiatric/Behavioral: Negative.      Today's Vitals   04/26/21 1002  BP: 122/80  Pulse: (!) 51  Temp: 98.1 F (36.7 C)  Weight: 136 lb (61.7 kg)  Height: 5' 1" (1.549 m)  PainSc: 0-No pain   Body mass index is 25.7 kg/m.  Wt Readings from Last 3 Encounters:  04/26/21 136 lb (61.7 kg)  03/08/21 135 lb (61.2 kg)  02/22/21 134 lb 3.2 oz (60.9 kg)    Objective:  Physical Exam Vitals and nursing note reviewed. Exam conducted with a chaperone present.  Constitutional:      Appearance: Normal appearance.  HENT:     Head: Normocephalic and atraumatic.     Right Ear: Tympanic membrane, ear canal and external ear normal. There is no impacted cerumen.     Left Ear: Tympanic membrane, ear canal and external ear normal. There is no impacted cerumen.     Nose: Nose normal. No congestion or rhinorrhea.     Mouth/Throat:     Mouth: Mucous membranes are moist.     Pharynx: Oropharynx is clear.  Eyes:     Extraocular Movements: Extraocular movements intact.     Conjunctiva/sclera: Conjunctivae normal.     Pupils: Pupils are equal, round, and reactive to light.  Cardiovascular:     Rate and Rhythm: Normal rate and regular rhythm.     Pulses: Normal pulses.     Heart sounds: Normal heart sounds. No murmur heard. Pulmonary:     Effort: Pulmonary effort is normal. No respiratory distress.      Breath sounds: Normal breath sounds. No wheezing.  Chest:  Breasts:    Tanner Score is 5.     Right: Normal.     Left: Normal.  Abdominal:     General: Abdomen is flat. Bowel sounds are normal. There is no distension.     Palpations: Abdomen is soft.     Tenderness: There is no abdominal tenderness.  Genitourinary:    Comments: External hemorrhoids  Musculoskeletal:        General: No swelling or tenderness. Normal range of motion.     Cervical back: Normal range of motion and neck supple.  Skin:    General: Skin is warm and dry.     Capillary Refill: Capillary refill takes less than 2 seconds.     Findings: Rash present.  Neurological:     General: No  focal deficit present.     Mental Status: She is alert and oriented to person, place, and time.  Psychiatric:        Mood and Affect: Mood normal.        Behavior: Behavior normal.        Assessment And Plan:     1. Routine general medical examination at a health care facility --Patient is here for their annual physical exam and we discussed any changes to medication and medical history.  -Behavior modification was discussed as well as diet and exercise history  -Patient will continue to exercise regularly and modify their diet.  -Recommendation for yearly physical annuals, immunization and screenings including mammogram and colonoscopy were discussed with the patient.  -Recommended intake of multivitamin, vitamin D and calcium.  -Individualized advise was given to the patient pertaining to their own health history in regards to diet, exercise, medical condition and referrals.  - CMP14+EGFR - CBC - Hemoglobin A1c - Lipid panel  2. Type 2 diabetes mellitus without complication, without long-term current use of insulin (HCC) -Continue meds, stable, chronic  --Discussed with patient the importance of glycemic control and long term complications from uncontrolled diabetes. Discussed with the patient the importance of  compliance with home glucose monitoring, diet which includes decrease amount of sugary drinks and foods. Importance of exercise was also discussed with the patient. Importance of eye exams, self foot care and compliance to office visits was also discussed with the patient.  - Hemoglobin A1c - metFORMIN (GLUCOPHAGE) 1000 MG tablet; Take 1 tablet (1,000 mg total) by mouth 2 (two) times daily.  Dispense: 180 tablet; Refill: 2  3. Essential hypertension -Chronic, continue meds  - POCT Urinalysis Dipstick (81002) - POCT UA - Microalbumin - EKG 12-Lead - CMP14+EGFR - CBC  4. Pure hypercholesterolemia -Chronic, continue meds  - Lipid panel - atorvastatin (LIPITOR) 20 MG tablet; Take 1 tablet (20 mg total) by mouth daily.  Dispense: 30 tablet; Refill: 2  5. Normocytic anemia - CBC  6. External hemorrhoid -OTC preparation H cream  -Educated patient to not strain and drink a lot of water, increase fiber intake.   7. Itching - hydrocortisone cream 1 %; Apply to affected area 2 times daily  Dispense: 30 g; Refill: 1  The patient was encouraged to call or send a message through [facility] for any questions or concerns.   Follow up: if symptoms persist or do not get better.   Side effects and appropriate use of all the medication(s) were discussed with the patient today. Patient advised to use the medication(s) as directed by their healthcare provider. The patient was encouraged to read, review, and understand all associated package inserts and contact our office with any questions or concerns. The patient accepts the risks of the treatment plan and had an opportunity to ask questions.   Staying healthy and adopting a healthy lifestyle for your overall health is important. You should eat 7 or more servings of fruits and vegetables per day. You should drink plenty of water to keep yourself hydrated and your kidneys healthy. This includes about 65-80+ fluid ounces of water. Limit your intake of animal  fats especially for elevated cholesterol. Avoid highly processed food and limit your salt intake if you have hypertension. Avoid foods high in saturated/Trans fats. Along with a healthy diet it is also very important to maintain time for yourself to maintain a healthy mental health with low stress levels. You should get atleast 150 min of moderate  intensity exercise weekly for a healthy heart. Along with eating right and exercising, aim for at least 7-9 hours of sleep daily.  Eat more whole grains which includes barley, wheat berries, oats, brown rice and whole wheat pasta. Use healthy plant oils which include olive, soy, corn, sunflower and peanut. Limit your caffeine and sugary drinks. Limit your intake of fast foods. Limit milk and dairy products to one or two daily servings.   Patient was given opportunity to ask questions. Patient verbalized understanding of the plan and was able to repeat key elements of the plan. All questions were answered to their satisfaction.  Raman Ghumman, DNP   I, Raman Ghumman have reviewed all documentation for this visit. The documentation on 10/26/20 for the exam, diagnosis, procedures, and orders are all accurate and complete.    THE PATIENT IS ENCOURAGED TO PRACTICE SOCIAL DISTANCING DUE TO THE COVID-19 PANDEMIC.    

## 2021-04-26 NOTE — Patient Instructions (Signed)

## 2021-05-07 ENCOUNTER — Other Ambulatory Visit: Payer: Self-pay | Admitting: Internal Medicine

## 2021-06-27 ENCOUNTER — Other Ambulatory Visit: Payer: Self-pay | Admitting: Nurse Practitioner

## 2021-06-27 DIAGNOSIS — E7800 Pure hypercholesterolemia, unspecified: Secondary | ICD-10-CM

## 2021-08-01 IMAGING — MG MM DIGITAL SCREENING BILAT W/ TOMO AND CAD
6 of 10 series · 6 of 30 positions shown · non-contrast
Comparison: Previous exam(s).

CLINICAL DATA: Screening.

EXAM:
DIGITAL SCREENING BILATERAL MAMMOGRAM WITH TOMOSYNTHESIS AND CAD
TECHNIQUE: Bilateral screening digital craniocaudal and mediolateral oblique
mammograms were obtained. Bilateral screening digital breast
tomosynthesis was performed. The images were evaluated with
computer-aided detection.

[L MLO synth-2D]
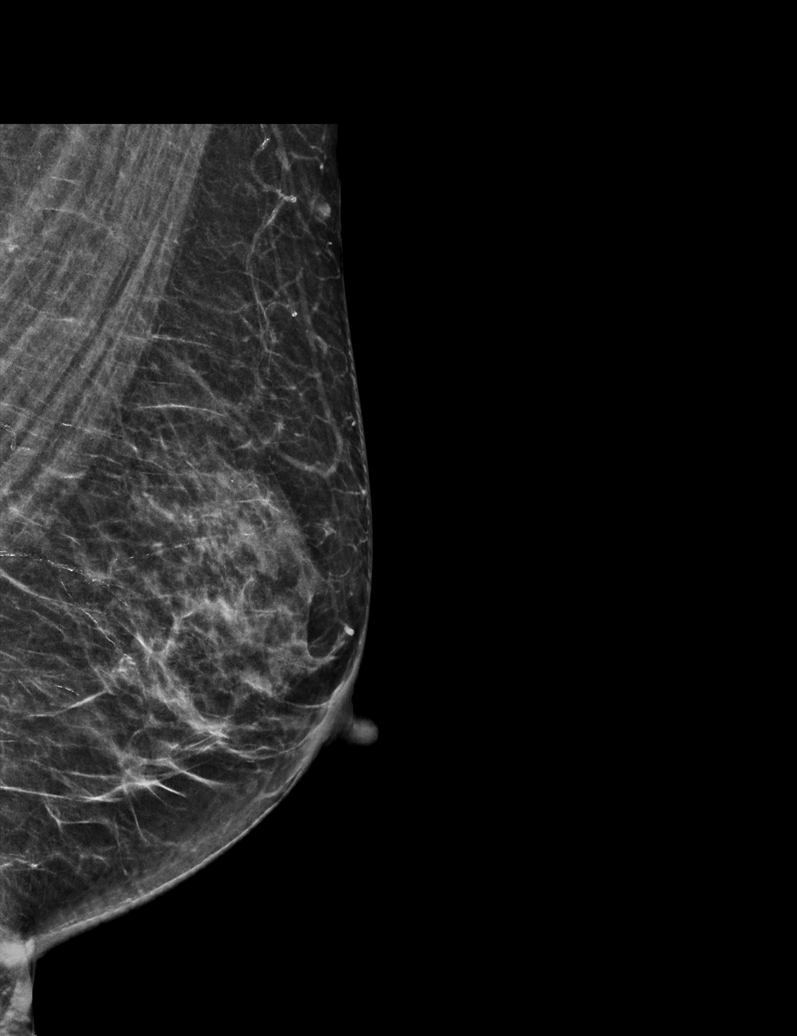

[R CC synth-2D (1 of 2)]
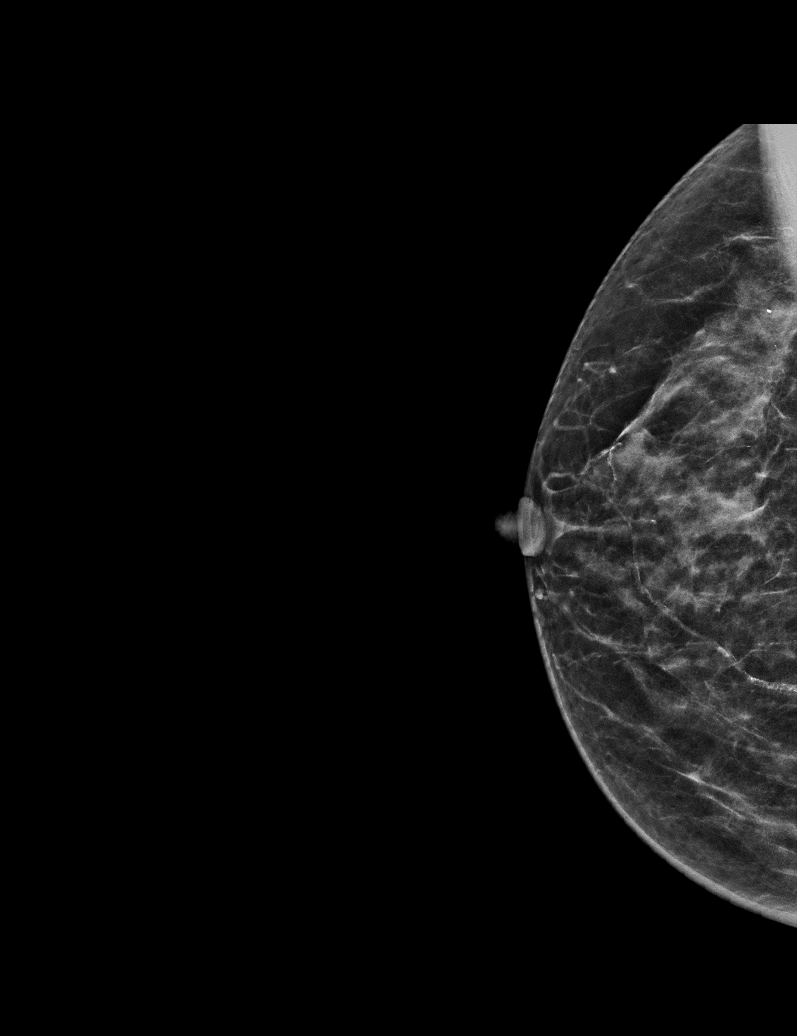

[R MLO synth-2D]
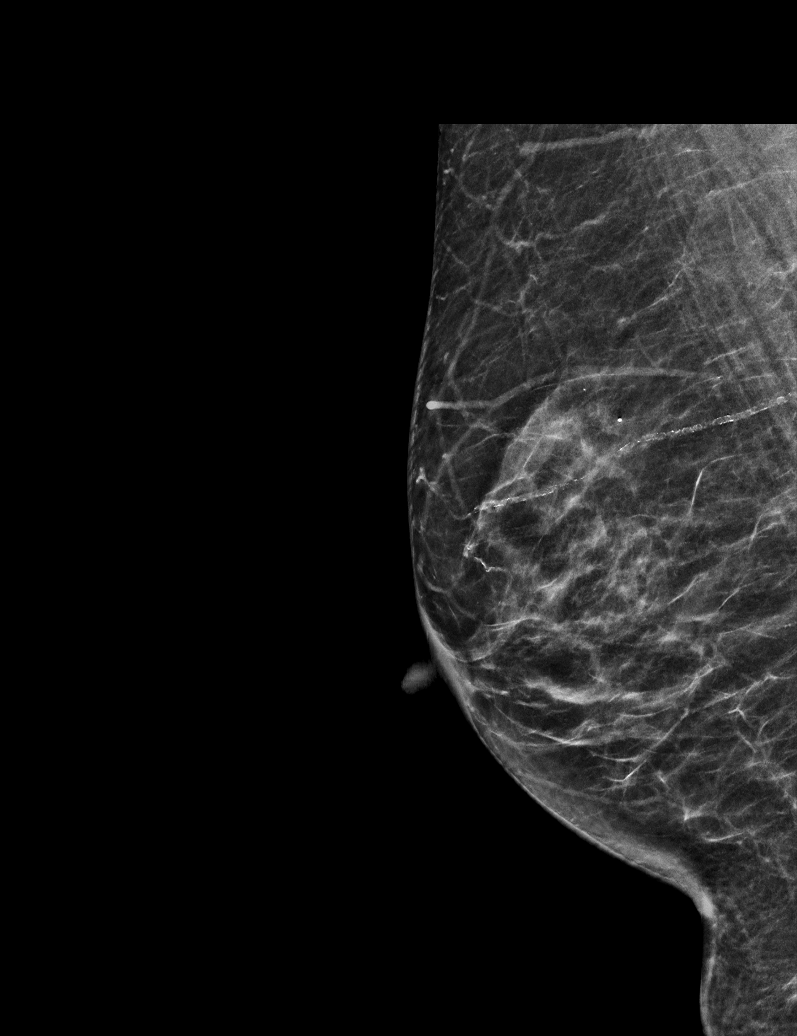

[L CC synth-2D]
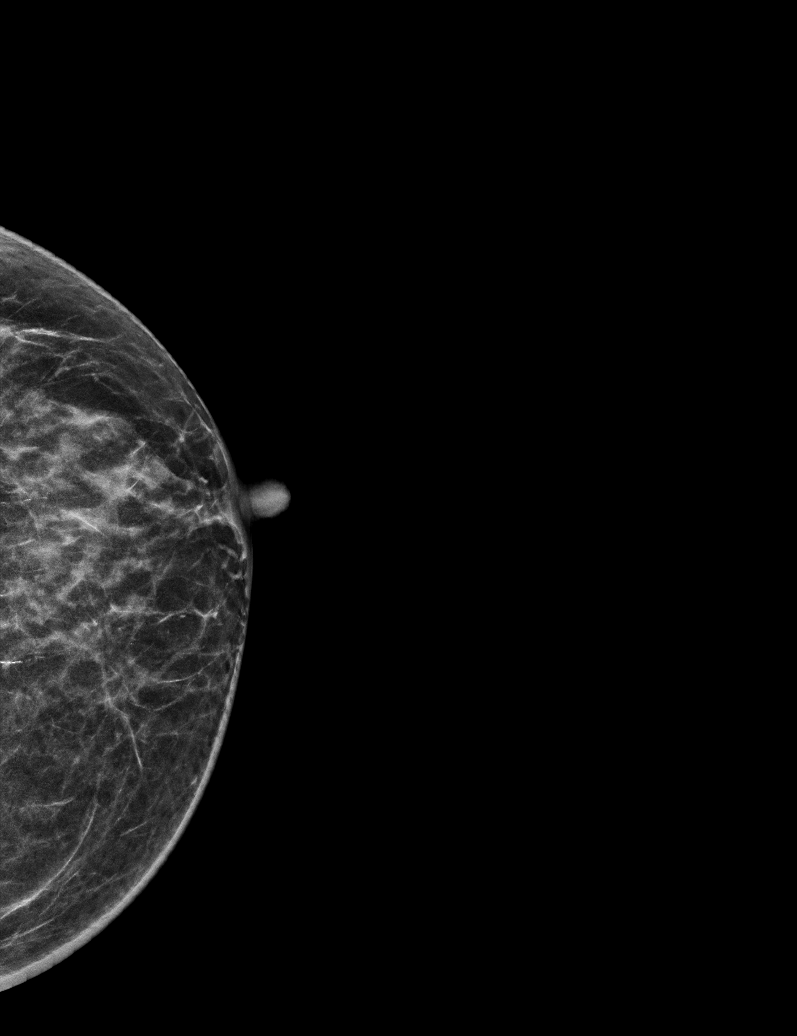

[R CC synth-2D (2 of 2)]
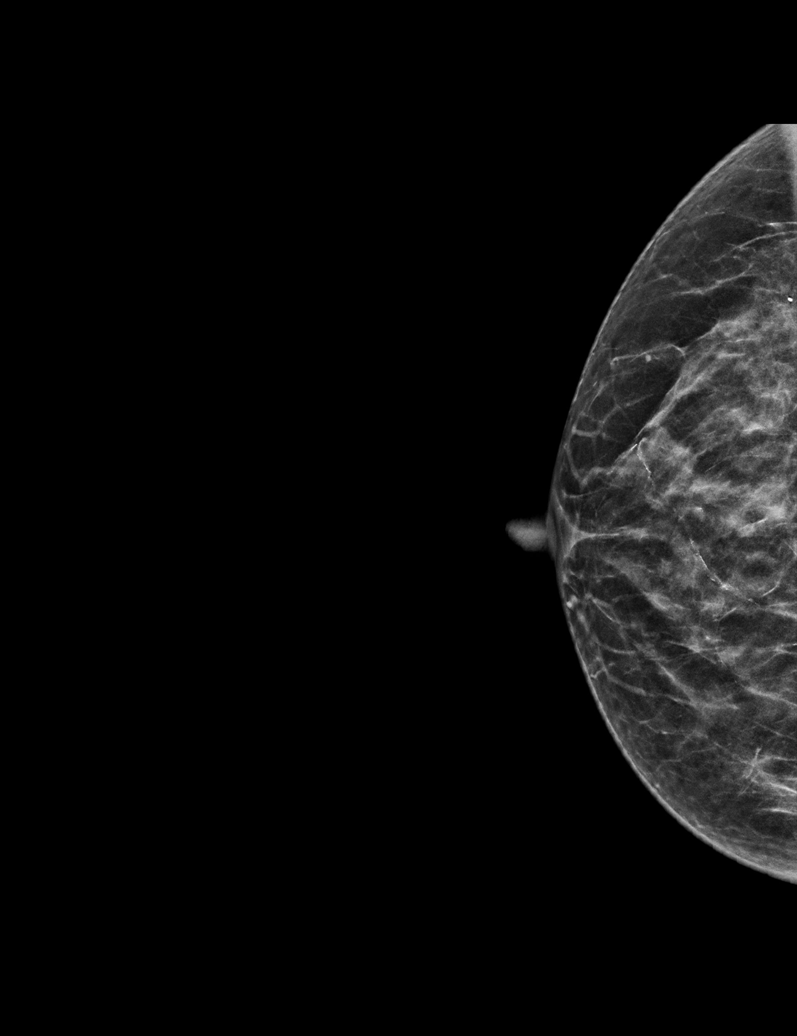

[R MLO tomo · tomo slice 27/54.0]
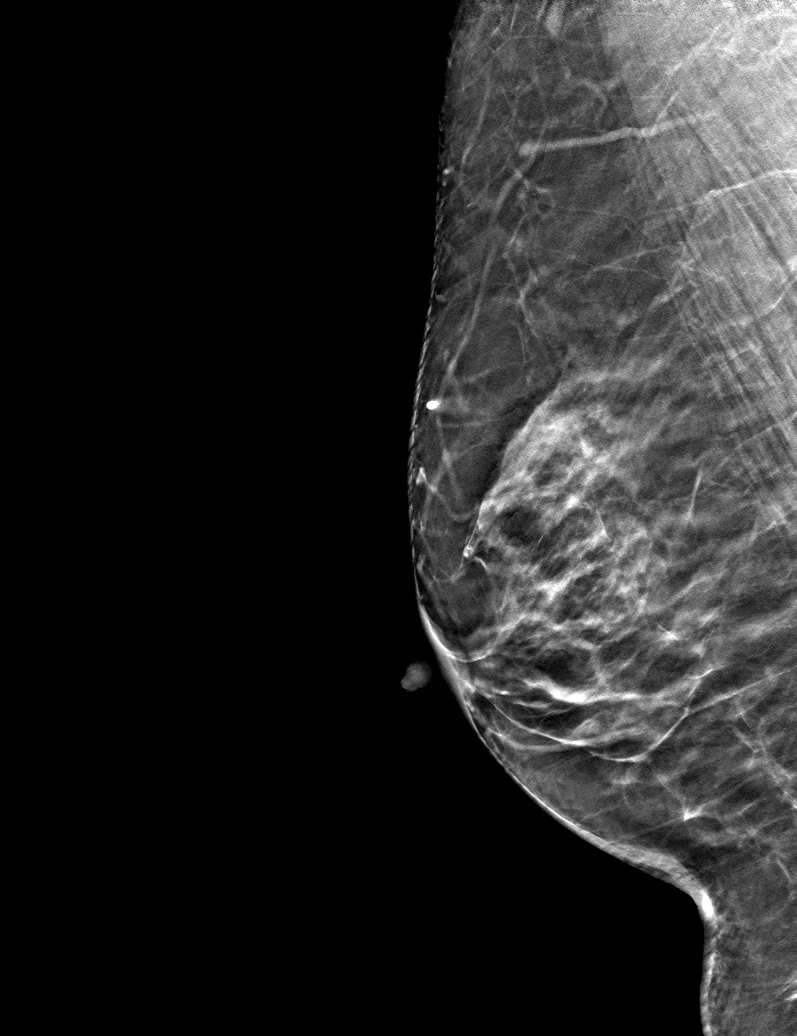

[6 of 30 positions shown; findings below may reference images not displayed]

ACR Breast Density Category c: The breast tissue is heterogeneously
dense, which may obscure small masses.
FINDINGS: There are no findings suspicious for malignancy. The images were
evaluated with computer-aided detection.
IMPRESSION: No mammographic evidence of malignancy. A result letter of this
screening mammogram will be mailed directly to the patient.

RECOMMENDATION:
Screening mammogram in one year. (Code:T4-5-GWO)

BI-RADS CATEGORY  1: Negative.

## 2021-08-14 ENCOUNTER — Other Ambulatory Visit (HOSPITAL_COMMUNITY): Payer: Self-pay

## 2021-08-23 NOTE — Progress Notes (Unsigned)
Office Visit Note  Patient: Cindy Ross             Date of Birth: 03/04/1951           MRN: [id number]             PCP: Glendale Chard, MD Referring: Glendale Chard, MD Visit Date: 09/06/2021 Occupation: '@GUAROCC'$ @  Subjective:  No chief complaint on file.   History of Present Illness: Cindy Ross is a 71 y.o. female ***   Activities of Daily Living:  Patient reports morning stiffness for *** {minute/hour:19697}.   Patient {ACTIONS;DENIES/REPORTS:21021675::"Denies"} nocturnal pain.  Difficulty dressing/grooming: {ACTIONS;DENIES/REPORTS:21021675::"Denies"} Difficulty climbing stairs: {ACTIONS;DENIES/REPORTS:21021675::"Denies"} Difficulty getting out of chair: {ACTIONS;DENIES/REPORTS:21021675::"Denies"} Difficulty using hands for taps, buttons, cutlery, and/or writing: {ACTIONS;DENIES/REPORTS:21021675::"Denies"}  No Rheumatology ROS completed.   PMFS History:  Patient Active Problem List   Diagnosis Date Noted   BRBPR (bright red blood per rectum) 10/03/2020   Normocytic anemia 10/03/2020   Age-related osteoporosis without current pathological fracture 03/30/2020   Language barrier 03/30/2020   Respiratory crackles at left lung base 05/19/2017   Essential hypertension 01/30/2016   DM type 2 (diabetes mellitus, type 2) ([place]) 01/30/2016   Hyperlipidemia 01/30/2016    Past Medical History:  Diagnosis Date   Diabetes mellitus without complication ([place])    Hyperlipidemia 01/30/2016   Hypertension 2007   Respiratory crackles at left lung base 05/19/2017   CXR 02/2017 in [facility] normal    Family History  Problem Relation Age of Onset   Hyperlipidemia Sister    Diabetes Sister    Hypertension Sister    Heart disease Brother    Healthy Son    Leukemia Brother    No past surgical history on file. Social History   Social History Narrative   Originally from [place]   Came to [place] In 76   Lives with younger sister, Celita Aron   Son lives in [place].  Works in  [employer].     Immunization History  Administered Date(s) Administered   Fluad Quad(high Dose 65+) 04/18/2020, 04/02/2021   Influenza Inj Mdck Quad Pf 07/31/2016, 05/19/2017   Influenza, High Dose Seasonal PF 04/30/2018, 03/30/2019   Influenza-Unspecified 04/17/2018, 03/30/2019   PFIZER Comirnaty(Gray Top)Covid-19 Tri-Sucrose Vaccine 12/14/2020   PFIZER(Purple Top)SARS-COV-2 Vaccination 07/23/2019, 08/13/2019, 03/14/2020   PNEUMOCOCCAL CONJUGATE-20 08/31/2020   Pneumococcal Conjugate-13 09/14/2018, 12/29/2018   Pneumococcal Polysaccharide-23 03/19/2016   Td 10/31/1994, 04/10/1995   Tdap 01/30/2016   Zoster Recombinat (Shingrix) 05/06/2019, 07/12/2019     Objective: Vital Signs: There were no vitals taken for this visit.   Physical Exam   Musculoskeletal Exam: ***  CDAI Exam: CDAI Score: -- Patient Global: --; Provider Global: -- Swollen: --; Tender: -- Joint Exam 09/06/2021   No joint exam has been documented for this visit   There is currently no information documented on the homunculus. Go to the Rheumatology activity and complete the homunculus joint exam.  Investigation: No additional findings.  Imaging: No results found.  Recent Labs: Lab Results  Component Value Date   WBC 4.6 04/26/2021   HGB 10.6 (L) 04/26/2021   PLT 224 04/26/2021   NA 144 04/26/2021   K 4.6 04/26/2021   CL 109 (H) 04/26/2021   CO2 22 04/26/2021   GLUCOSE 95 04/26/2021   BUN 24 04/26/2021   CREATININE 0.99 04/26/2021   BILITOT 0.4 04/26/2021   ALKPHOS 68 04/26/2021   AST 28 04/26/2021   ALT 26 04/26/2021   PROT 7.9 04/26/2021  ALBUMIN 4.6 04/26/2021   CALCIUM 9.7 04/26/2021   GFRAA 65 09/12/2020    Speciality Comments: No specialty comments available.  Procedures:  No procedures performed Allergies: Patient has no known allergies.   Assessment / Plan:     Visit Diagnoses: No diagnosis found.  Orders: No orders of the defined types were placed in this  encounter.  No orders of the defined types were placed in this encounter.   Face-to-face time spent with patient was *** minutes. Greater than 50% of time was spent in counseling and coordination of care.  Follow-Up Instructions: No follow-ups on file.   Earnestine Mealing, CMA  Note - This record has been created using Editor, commissioning.  Chart creation errors have been sought, but may not always  have been located. Such creation errors do not reflect on  the standard of medical care.

## 2021-09-04 ENCOUNTER — Other Ambulatory Visit (HOSPITAL_COMMUNITY): Payer: Self-pay

## 2021-09-04 ENCOUNTER — Telehealth: Payer: Self-pay | Admitting: Pharmacist

## 2021-09-04 ENCOUNTER — Other Ambulatory Visit: Payer: Self-pay

## 2021-09-04 ENCOUNTER — Ambulatory Visit (INDEPENDENT_AMBULATORY_CARE_PROVIDER_SITE_OTHER): Payer: Medicare Other | Admitting: Internal Medicine

## 2021-09-04 ENCOUNTER — Encounter: Payer: Self-pay | Admitting: Internal Medicine

## 2021-09-04 VITALS — BP 132/70 | HR 102 | Temp 98.2°F | Ht 60.2 in | Wt 129.0 lb

## 2021-09-04 DIAGNOSIS — Z79899 Other long term (current) drug therapy: Secondary | ICD-10-CM

## 2021-09-04 DIAGNOSIS — E785 Hyperlipidemia, unspecified: Secondary | ICD-10-CM

## 2021-09-04 DIAGNOSIS — E119 Type 2 diabetes mellitus without complications: Secondary | ICD-10-CM | POA: Diagnosis not present

## 2021-09-04 DIAGNOSIS — M25572 Pain in left ankle and joints of left foot: Secondary | ICD-10-CM

## 2021-09-04 DIAGNOSIS — W1830XA Fall on same level, unspecified, initial encounter: Secondary | ICD-10-CM | POA: Diagnosis not present

## 2021-09-04 DIAGNOSIS — E1169 Type 2 diabetes mellitus with other specified complication: Secondary | ICD-10-CM | POA: Diagnosis not present

## 2021-09-04 DIAGNOSIS — I10 Essential (primary) hypertension: Secondary | ICD-10-CM | POA: Diagnosis not present

## 2021-09-04 DIAGNOSIS — M810 Age-related osteoporosis without current pathological fracture: Secondary | ICD-10-CM

## 2021-09-04 LAB — CMP14+EGFR
ALT: 15 IU/L (ref 0–32)
AST: 19 IU/L (ref 0–40)
Albumin/Globulin Ratio: 1.3 (ref 1.2–2.2)
Albumin: 4.8 g/dL (ref 3.8–4.8)
Alkaline Phosphatase: 73 IU/L (ref 44–121)
BUN/Creatinine Ratio: 20 (ref 12–28)
BUN: 24 mg/dL (ref 8–27)
Bilirubin Total: 0.3 mg/dL (ref 0.0–1.2)
CO2: 21 mmol/L (ref 20–29)
Calcium: 10.1 mg/dL (ref 8.7–10.3)
Chloride: 107 mmol/L — ABNORMAL HIGH (ref 96–106)
Creatinine, Ser: 1.18 mg/dL — ABNORMAL HIGH (ref 0.57–1.00)
Globulin, Total: 3.6 g/dL (ref 1.5–4.5)
Glucose: 123 mg/dL — ABNORMAL HIGH (ref 70–99)
Potassium: 5.5 mmol/L — ABNORMAL HIGH (ref 3.5–5.2)
Sodium: 140 mmol/L (ref 134–144)
Total Protein: 8.4 g/dL (ref 6.0–8.5)
eGFR: 50 mL/min/{1.73_m2} — ABNORMAL LOW (ref >59)

## 2021-09-04 LAB — HEMOGLOBIN A1C
Est. average glucose Bld gHb Est-mCnc: 123 mg/dL
Hgb A1c MFr Bld: 5.9 % — ABNORMAL HIGH (ref 4.8–5.6)

## 2021-09-04 NOTE — Telephone Encounter (Signed)
Patient's Prolia due on 09/18/21. She had CMP drawn today at PCP visit.  ?Per test claim, copay is $4.30 for injection ? ?Knox Saliva, PharmD, MPH, BCPS ?Clinical Pharmacist (Rheumatology and Pulmonology) ?

## 2021-09-04 NOTE — Progress Notes (Signed)
?Rich Brave Llittleton,acting as a Education administrator for [name], MD.,have documented all relevant documentation on the behalf of [name], MD,as directed by  [name], MD while in the presence of [name], MD.  ?This visit occurred during the SARS-CoV-2 public health emergency.  Safety protocols were in place, including screening questions prior to the visit, additional usage of staff PPE, and extensive cleaning of exam room while observing appropriate contact time as indicated for disinfecting solutions. ? ?Subjective:  ?  ? Patient ID: Cindy Ross , female    DOB: [birth year] , 71 y.o.   MRN: [id number] ? ? ?Chief Complaint  ?Patient presents with  ? Diabetes  ? Hypertension  ? ? ?HPI ? ?She presents today for bp/dm check. She reports compliance with her meds. She denies headaches, chest pain and shortness of breath.  She also stated she fell 2 weeks ago. She states she slipped on her wet floor in the kitchen. She did have on shoes. She reports she slid into the wall and hurt her left ankle. Initially, left foot was bruised - she took ibuprofen and Tylenol. She did fall to the ground. She did not seek any medical attention. She states the bruising has gone, but she is still having pain with movement of her left ankle.  ? ?Diabetes ?She presents for her follow-up diabetic visit. She has type 2 diabetes mellitus. There are no hypoglycemic associated symptoms. Pertinent negatives for diabetes include no blurred vision, no polydipsia, no polyphagia and no polyuria. There are no hypoglycemic complications. She is compliant with treatment most of the time. She is following a diabetic diet. When asked about meal planning, she reported none. She participates in exercise intermittently. Her home blood glucose trend is fluctuating minimally. An ACE inhibitor/angiotensin II receptor blocker is being taken. Eye exam is not current.  ?Hypertension ?This is a chronic problem. The current episode started more than  1 year ago. The problem has been gradually improving since onset. The problem is controlled. Pertinent negatives include no blurred vision or palpitations. Risk factors for coronary artery disease include diabetes mellitus, dyslipidemia and post-menopausal state. The current treatment provides moderate improvement.   ? ?Past Medical History:  ?Diagnosis Date  ? Diabetes mellitus without complication ([place])   ? Hyperlipidemia 01/30/2016  ? Hypertension 2007  ? Respiratory crackles at left lung base 05/19/2017  ? CXR 02/2017 in [facility] normal  ?  ? ?Family History  ?Problem Relation Age of Onset  ? Hyperlipidemia Sister   ? Diabetes Sister   ? Hypertension Sister   ? Heart disease Brother   ? Healthy Son   ? Leukemia Brother   ? ? ? ?Current Outpatient Medications:  ?  Accu-Chek FastClix Lancets MISC, : Use as directed to check blood sugars 2 times per day dx:e11.22, Disp: 300 each, Rfl: 2 ?  ACCU-CHEK GUIDE test strip, : USE AS DIRECTED TO CHECK BLOOD SUGARS 2 TIMES PER DAY DX:E11.22, Disp: 200 strip, Rfl: 4 ?  aspirin EC 81 MG tablet, Take 1 tablet (81 mg total) by mouth daily., Disp: , Rfl:  ?  atorvastatin (LIPITOR) 20 MG tablet, Take 1 tablet (20 mg total) by mouth daily., Disp: 30 tablet, Rfl: 2 ?  Blood Glucose Monitoring Suppl (ACCU-CHEK GUIDE) w/Device KIT, : Use as directed to check blood sugars 2 times per day dx:e11.22, Disp: 1 kit, Rfl: 1 ?  cholecalciferol (VITAMIN D) 1000 units tablet, Take 1,000 Units by mouth daily., Disp: ,  Rfl:  ?  metFORMIN (GLUCOPHAGE) 1000 MG tablet, Take 1 tablet (1,000 mg total) by mouth 2 (two) times daily., Disp: 180 tablet, Rfl: 2 ?  Multiple Vitamins-Minerals (ONE-A-DAY WOMENS 50+ ADVANTAGE PO), Take 1 tablet by mouth daily at 12 noon., Disp: , Rfl:  ?  olmesartan-hydrochlorothiazide (BENICAR HCT) 40-12.5 MG tablet, TAKE 1 TABLET BY MOUTH EVERY DAY, Disp: 90 tablet, Rfl: 1 ?  CLENPIQ 10-3.5-12 MG-GM -GM/160ML SOLN, SMARTSIG:160 Milliliter(s) By Mouth As Directed  (Patient not taking: Reported on 04/26/2021), Disp: , Rfl:  ?  denosumab (PROLIA) 60 MG/ML SOSY injection, INJECT 60 MG INTO THE SKIN EVERY 6 (SIX) MONTHS. (Patient not taking: Reported on 04/26/2021), Disp: 1 mL, Rfl: 0 ?  hydrocortisone cream 1 %, Apply to affected area 2 times daily, Disp: 30 g, Rfl: 1  ? ?No Known Allergies  ? ?Review of Systems  ?Constitutional: Negative.   ?Eyes:  Negative for blurred vision.  ?Respiratory: Negative.    ?Cardiovascular: Negative.  Negative for palpitations.  ?Endocrine: Negative for polydipsia, polyphagia and polyuria.  ?Musculoskeletal:  Positive for arthralgias.  ?Skin: Negative.   ?Neurological: Negative.   ?Psychiatric/Behavioral: Negative.     ? ?Today's Vitals  ? 09/04/21 0918  ?BP: 132/70  ?Pulse: (!) 102  ?Temp: 98.2 ?F (36.8 ?C)  ?Weight: 129 lb (58.5 kg)  ?Height: 5' 0.2" (1.529 m)  ?PainSc: 0-No pain  ? ?Body mass index is 25.03 kg/m?.  ?Wt Readings from Last 3 Encounters:  ?09/04/21 129 lb (58.5 kg)  ?04/26/21 136 lb (61.7 kg)  ?03/08/21 135 lb (61.2 kg)  ?  ? ?Objective:  ?Physical Exam ?Vitals and nursing note reviewed.  ?Constitutional:   ?   Appearance: Normal appearance.  ?HENT:  ?   Head: Normocephalic and atraumatic.  ?   Nose:  ?   Comments: Masked  ?   Mouth/Throat:  ?   Comments: Masked  ?Eyes:  ?   Extraocular Movements: Extraocular movements intact.  ?Cardiovascular:  ?   Rate and Rhythm: Normal rate and regular rhythm.  ?   Pulses:     ?     Dorsalis pedis pulses are 2+ on the right side and 2+ on the left side.  ?   Heart sounds: Normal heart sounds.  ?Pulmonary:  ?   Effort: Pulmonary effort is normal.  ?   Breath sounds: Normal breath sounds.  ?Musculoskeletal:  ?   Cervical back: Normal range of motion.  ?   Left ankle: Tenderness present over the lateral malleolus and medial malleolus.  ?Feet:  ?   Right foot:  ?   Protective Sensation: 5 sites tested.  5 sites sensed.  ?   Skin integrity: Callus and dry skin present.  ?   Toenail Condition: Right  toenails are abnormally thick.  ?   Left foot:  ?   Protective Sensation: 5 sites tested.  5 sites sensed.  ?   Skin integrity: Callus and dry skin present.  ?   Toenail Condition: Left toenails are abnormally thick.  ?   Comments: Medial/lateral malleolus tender to palpation, pain with flexion/extension, no bruising noted ?Skin: ?   General: Skin is warm.  ?Neurological:  ?   General: No focal deficit present.  ?   Mental Status: She is alert.  ?Psychiatric:     ?   Mood and Affect: Mood normal.     ?   Behavior: Behavior normal.  ?   ?Assessment And Plan:  ?   ?1.  Type 2 diabetes mellitus with hyperlipidemia ([place]) ?Comments: Chronic, I will check labs as below. I do not anticipate any need for med changes. LDL 72 Nov 2022, she will c/w atorvastatin. She will f/u in 4 months.  ?- CMP14+EGFR ?- Hemoglobin A1c ? ?2. Essential hypertension ?Comments: Chronic, fair control. Goal BP<130/80. She is encouraged to follow low sodium diet.  She will c/w olmesartan/hctz today.  ? ?3. Fall on same level, initial encounter ?Comments: She fell two weeks ago on 08/21/21. She is encouraged to avoid walking on wet floors and to remove all throw rugs.  ? ?4. Acute left ankle pain ?Comments: She declines Podiatry referral, states she is scheduled to see Rheum on 09/06/21. Pt advised she will need x-rays performed. May benefit from Voltaren gel.  ?  ?Patient was given opportunity to ask questions. Patient verbalized understanding of the plan and was able to repeat key elements of the plan. All questions were answered to their satisfaction.  ? ?I, [name], MD, have reviewed all documentation for this visit. The documentation on 09/04/21 for the exam, diagnosis, procedures, and orders are all accurate and complete.  ? ?IF YOU HAVE BEEN REFERRED TO A SPECIALIST, IT MAY TAKE 1-2 WEEKS TO SCHEDULE/PROCESS THE REFERRAL. IF YOU HAVE NOT HEARD FROM US/SPECIALIST IN TWO WEEKS, PLEASE GIVE [place] A CALL AT [phone] X [phone].  ? ?THE PATIENT  IS ENCOURAGED TO PRACTICE SOCIAL DISTANCING DUE TO THE COVID-19 PANDEMIC.   ?

## 2021-09-04 NOTE — Patient Instructions (Signed)

## 2021-09-06 ENCOUNTER — Ambulatory Visit (INDEPENDENT_AMBULATORY_CARE_PROVIDER_SITE_OTHER): Payer: Medicare Other

## 2021-09-06 ENCOUNTER — Other Ambulatory Visit: Payer: Self-pay

## 2021-09-06 ENCOUNTER — Encounter: Payer: Self-pay | Admitting: Rheumatology

## 2021-09-06 ENCOUNTER — Ambulatory Visit (INDEPENDENT_AMBULATORY_CARE_PROVIDER_SITE_OTHER): Payer: Medicare Other | Admitting: Orthopedic Surgery

## 2021-09-06 ENCOUNTER — Other Ambulatory Visit (HOSPITAL_COMMUNITY): Payer: Self-pay

## 2021-09-06 ENCOUNTER — Ambulatory Visit (INDEPENDENT_AMBULATORY_CARE_PROVIDER_SITE_OTHER): Payer: Medicare Other | Admitting: Rheumatology

## 2021-09-06 ENCOUNTER — Encounter: Payer: Self-pay | Admitting: Orthopedic Surgery

## 2021-09-06 VITALS — BP 122/65 | HR 86 | Resp 15 | Ht 61.0 in | Wt 131.0 lb

## 2021-09-06 DIAGNOSIS — M19042 Primary osteoarthritis, left hand: Secondary | ICD-10-CM | POA: Diagnosis not present

## 2021-09-06 DIAGNOSIS — M25572 Pain in left ankle and joints of left foot: Secondary | ICD-10-CM

## 2021-09-06 DIAGNOSIS — Z9181 History of falling: Secondary | ICD-10-CM | POA: Diagnosis not present

## 2021-09-06 DIAGNOSIS — M25562 Pain in left knee: Secondary | ICD-10-CM

## 2021-09-06 DIAGNOSIS — Z789 Other specified health status: Secondary | ICD-10-CM | POA: Diagnosis not present

## 2021-09-06 DIAGNOSIS — E559 Vitamin D deficiency, unspecified: Secondary | ICD-10-CM

## 2021-09-06 DIAGNOSIS — R778 Other specified abnormalities of plasma proteins: Secondary | ICD-10-CM

## 2021-09-06 DIAGNOSIS — M19041 Primary osteoarthritis, right hand: Secondary | ICD-10-CM | POA: Diagnosis not present

## 2021-09-06 DIAGNOSIS — Z5181 Encounter for therapeutic drug level monitoring: Secondary | ICD-10-CM | POA: Diagnosis not present

## 2021-09-06 DIAGNOSIS — E119 Type 2 diabetes mellitus without complications: Secondary | ICD-10-CM | POA: Diagnosis not present

## 2021-09-06 DIAGNOSIS — M810 Age-related osteoporosis without current pathological fracture: Secondary | ICD-10-CM

## 2021-09-06 DIAGNOSIS — I10 Essential (primary) hypertension: Secondary | ICD-10-CM | POA: Diagnosis not present

## 2021-09-06 DIAGNOSIS — E7800 Pure hypercholesterolemia, unspecified: Secondary | ICD-10-CM

## 2021-09-06 DIAGNOSIS — S8265XA Nondisplaced fracture of lateral malleolus of left fibula, initial encounter for closed fracture: Secondary | ICD-10-CM | POA: Diagnosis not present

## 2021-09-06 MED ORDER — DENOSUMAB 60 MG/ML ~~LOC~~ SOSY
60.0000 mg | PREFILLED_SYRINGE | SUBCUTANEOUS | 0 refills | Status: DC
  Filled 2021-09-06 (×2): qty 1, 180d supply, fill #0

## 2021-09-06 MED ORDER — HYDROCODONE-ACETAMINOPHEN 5-325 MG PO TABS
1.0000 | ORAL_TABLET | Freq: Four times a day (QID) | ORAL | 0 refills | Status: DC | PRN
Start: 2021-09-06 — End: 2022-01-07

## 2021-09-06 NOTE — Telephone Encounter (Signed)
Patient had OV today. Prolia appt shceduled for 09/18/21. ? ?Rx sent to [facility] today with note to courier to clinic by 09/18/21. ? ?Knox Saliva, PharmD, MPH, BCPS ?Clinical Pharmacist (Rheumatology and Pulmonology) ?

## 2021-09-06 NOTE — Progress Notes (Signed)
? ?Office Visit Note ?  ?Patient: Cindy Ross           ?Date of Birth: [birth year]           ?MRN: [id number] ?Visit Date: 09/06/2021 ?             ?Requested by: Glendale Chard, MD ?[address] ?[address] ?[place],  [place] [address] ?PCP: Glendale Chard, MD ? ?Chief Complaint  ?Patient presents with  ? Left Ankle - Pain  ? ? ? ? ?HPI: ?Patient is a 71 year old woman who is seen for initial evaluation for left ankle pain from a fall 2 weeks ago.  Patient has been full weightbearing in regular shoes. ? ?Assessment & Plan: ?Visit Diagnoses:  ?1. Closed nondisplaced fracture of lateral malleolus of left fibula, initial encounter   ? ? ?Plan: Patient has a nondisplaced Weber B fibular fracture.  We will place her in a fracture boot a prescription for Vicodin for pain follow-up in 2 weeks with repeat three-view radiographs of the left ankle. ? ?Follow-Up Instructions: No follow-ups on file.  ? ?Ortho Exam ? ?Patient is alert, oriented, no adenopathy, well-dressed, normal affect, normal respiratory effort. ?Examination patient has a palpable pulse she has pain to palpation over the distal fibula.  There is no pain with passive range of motion of the ankle.  The left knee is nontender to palpation.  There is no ecchymosis or bruising around the ankle.  There is some swelling.  Radiograph shows no widening of the mortise.  The mortise is congruent. ? ?Imaging: ?XR Ankle Complete Left ? ?Result Date: 09/06/2021 ?Three-view radiographs of the left ankle shows a congruent mortise with a nondisplaced Weber B fibular fracture. ? ?XR KNEE 3 VIEW LEFT ? ?Result Date: 09/06/2021 ?Mild medial compartment narrowing was noted.  Mild patellofemoral narrowing was noted.  No chondrocalcinosis was noted. 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  ?No images are attached to the encounter. ? ?Labs: ?Lab Results  ?Component Value Date  ? HGBA1C 5.9 (H) 09/04/2021  ? HGBA1C 6.3 (H)  04/26/2021  ? HGBA1C 6.2 (H) 10/03/2020  ? ? ? ?Lab Results  ?Component Value Date  ? ALBUMIN 4.8 09/04/2021  ? ALBUMIN 4.6 04/26/2021  ? ALBUMIN 4.6 01/20/2020  ? ? ?No results found for: MG ?Lab Results  ?Component Value Date  ? VD25OH 57 03/08/2021  ? VD25OH 61 09/12/2020  ? VD25OH 70 03/07/2020  ? ? ?No results found for: PREALBUMIN ? ?  Latest Ref Rng & Units 04/26/2021  ? 10:40 AM 03/08/2021  ?  9:14 AM 09/12/2020  ?  1:36 PM  ?CBC EXTENDED  ?WBC 3.4 - 10.8 x10E3/uL 4.6   4.8   6.8    ?RBC 3.77 - 5.28 x10E6/uL 3.59   3.75   3.63    ?Hemoglobin 11.1 - 15.9 g/dL 10.6   11.2   10.9    ?HCT 34.0 - 46.6 % 32.5   34.9   34.4    ?Platelets 150 - 450 x10E3/uL 224   191   240    ?NEUT# 1,500 - 7,800 cells/uL  1,853   3,121    ?Lymph# 850 - 3,900 cells/uL  2,266   2,924    ? ? ? ?There is no height or weight on file to calculate BMI. ? ?Orders:  ?No orders of the defined types were placed in this encounter. ? ?Meds ordered this encounter  ?Medications  ? HYDROcodone-acetaminophen (NORCO/VICODIN) 5-325 MG  tablet  ?  Sig: Take 1 tablet by mouth every 6 (six) hours as needed for moderate pain.  ?  Dispense:  20 tablet  ?  Refill:  0  ? ? ? Procedures: ?No procedures performed ? ?Clinical Data: ?No additional findings. ? ?ROS: ? ?All other systems negative, except as noted in the HPI. ?Review of Systems ? ?Objective: ?Vital Signs: There were no vitals taken for this visit. ? ?Specialty Comments:  ?No specialty comments available. ? ?PMFS History: ?Patient Active Problem List  ? Diagnosis Date Noted  ? BRBPR (bright red blood per rectum) 10/03/2020  ? Normocytic anemia 10/03/2020  ? Age-related osteoporosis without current pathological fracture 03/30/2020  ? Language barrier 03/30/2020  ? Respiratory crackles at left lung base 05/19/2017  ? Essential hypertension 01/30/2016  ? DM type 2 (diabetes mellitus, type 2) ([place]) 01/30/2016  ? Hyperlipidemia 01/30/2016  ? ?Past Medical History:  ?Diagnosis Date  ? Diabetes mellitus  without complication ([place])   ? Hyperlipidemia 01/30/2016  ? Hypertension 2007  ? Respiratory crackles at left lung base 05/19/2017  ? CXR 02/2017 in [facility] normal  ?  ?Family History  ?Problem Relation Age of Onset  ? Hyperlipidemia Sister   ? Diabetes Sister   ? Hypertension Sister   ? Heart disease Brother   ? Healthy Son   ? Leukemia Brother   ?  ?History reviewed. No pertinent surgical history. ?Social History  ? ?Occupational History  ? Occupation: house cleaning.  ?  Comment: Worked for bakery in past  ? Occupation: retired  ?Tobacco Use  ? Smoking status: Never  ?  Passive exposure: Never  ? Smokeless tobacco: Never  ?Vaping Use  ? Vaping Use: Never used  ?Substance and Sexual Activity  ? Alcohol use: No  ?  Alcohol/week: 0.0 standard drinks  ? Drug use: No  ? Sexual activity: Not Currently  ? ? ? ? ? ?

## 2021-09-10 ENCOUNTER — Other Ambulatory Visit (HOSPITAL_COMMUNITY): Payer: Self-pay

## 2021-09-10 NOTE — Telephone Encounter (Signed)
Prolia received from [facility]. Placed in refrigerator ? ?Knox Saliva, PharmD, MPH, BCPS ?Clinical Pharmacist (Rheumatology and Pulmonology) ? ?

## 2021-09-11 NOTE — Progress Notes (Signed)
Pharmacy Note ? ?Subjective:   ?Patient presents to clinic today to receive bi-annual dose of Prolia. Her last Prolia was on 03/22/21. She reports doing much better, specifically ankle pain is improved, since last office visit on 09/06/21. ? ?Patient running a fever or have signs/symptoms of infection? No ? ?Patient currently on antibiotics for the treatment of infection? No ? ?Patient had fall in the last 6 months?  Yes  She had a fall about 3 weeks ago when she slipped on water at home. She reported left ankle pain and x-ray revealed nondisplaced fibular fracture. She was referred to Dr. Sharol Given that same day. He placed her in fracture boot and provided rx for Vicodin for pain. ? ?Patient taking calcium 1200 mg daily through diet or supplement and at least 800 units vitamin D? Yes ? ?Objective: ?CMP  ?   ?Component Value Date/Time  ? NA 140 09/04/2021 0941  ? K 5.5 (H) 09/04/2021 0941  ? CL 107 (H) 09/04/2021 0941  ? CO2 21 09/04/2021 0941  ? GLUCOSE 123 (H) 09/04/2021 0941  ? GLUCOSE 102 (H) 03/08/2021 0914  ? BUN 24 09/04/2021 0941  ? CREATININE 1.18 (H) 09/04/2021 0941  ? CREATININE 1.07 (H) 03/08/2021 0914  ? CALCIUM 10.1 09/04/2021 0941  ? PROT 8.4 09/04/2021 0941  ? ALBUMIN 4.8 09/04/2021 0941  ? AST 19 09/04/2021 0941  ? ALT 15 09/04/2021 0941  ? ALKPHOS 73 09/04/2021 0941  ? BILITOT 0.3 09/04/2021 0941  ? GFRNONAA 56 (L) 09/12/2020 1336  ? GFRAA 65 09/12/2020 1336  ? ? ?CBC ?   ?Component Value Date/Time  ? WBC 4.6 04/26/2021 1040  ? WBC 4.8 03/08/2021 0914  ? RBC 3.59 (L) 04/26/2021 1040  ? RBC 3.75 (L) 03/08/2021 0914  ? HGB 10.6 (L) 04/26/2021 1040  ? HCT 32.5 (L) 04/26/2021 1040  ? PLT 224 04/26/2021 1040  ? MCV 91 04/26/2021 1040  ? MCH 29.5 04/26/2021 1040  ? MCH 29.9 03/08/2021 0914  ? MCHC 32.6 04/26/2021 1040  ? MCHC 32.1 03/08/2021 0914  ? RDW 12.6 04/26/2021 1040  ? LYMPHSABS 2,266 03/08/2021 0914  ? LYMPHSABS 3.9 (H) 11/18/2016 0936  ? EOSABS 240 03/08/2021 0914  ? EOSABS 0.6 (H) 11/18/2016 0936  ?  BASOSABS 29 03/08/2021 0914  ? BASOSABS 0.0 11/18/2016 0936  ? ? ?Lab Results  ?Component Value Date  ? VD25OH 57 03/08/2021  ? ? ?T-score: DEXA 11/02/19: BMD measured at Femur Neck Right is 0.573 g/cm2 with a T-score of -3.3.  Her last Prolia injection was on 03/22/2021.  She will be getting her next 40 injection in April.  ? ?Assessment/Plan:  ? ?Patient tolerated injection without issue.  ? ?Administrations This Visit   ? ? denosumab (PROLIA) injection 60 mg   ? ? Admin Date ?09/18/2021 Action ?Given Dose ?60 mg Route ?Subcutaneous Administered By ?Cassandria Anger, RPH  ? ?  ?  ? ?  ? ?  ?All questions encouraged and answered.  Instructed patient to call with any further questions or concerns. ?  ?Knox Saliva, PharmD, MPH, BCPS ?Clinical Pharmacist (Rheumatology and Pulmonology) ?

## 2021-09-18 ENCOUNTER — Encounter: Payer: Self-pay | Admitting: Orthopedic Surgery

## 2021-09-18 ENCOUNTER — Ambulatory Visit (INDEPENDENT_AMBULATORY_CARE_PROVIDER_SITE_OTHER): Payer: Medicare Other | Admitting: Pharmacist

## 2021-09-18 ENCOUNTER — Ambulatory Visit (INDEPENDENT_AMBULATORY_CARE_PROVIDER_SITE_OTHER): Payer: Medicare Other | Admitting: Orthopedic Surgery

## 2021-09-18 ENCOUNTER — Ambulatory Visit (INDEPENDENT_AMBULATORY_CARE_PROVIDER_SITE_OTHER): Payer: Medicare Other

## 2021-09-18 VITALS — BP 146/69 | HR 79

## 2021-09-18 DIAGNOSIS — S8265XA Nondisplaced fracture of lateral malleolus of left fibula, initial encounter for closed fracture: Secondary | ICD-10-CM | POA: Diagnosis not present

## 2021-09-18 DIAGNOSIS — Z7689 Persons encountering health services in other specified circumstances: Secondary | ICD-10-CM

## 2021-09-18 DIAGNOSIS — M810 Age-related osteoporosis without current pathological fracture: Secondary | ICD-10-CM

## 2021-09-18 MED ORDER — DENOSUMAB 60 MG/ML ~~LOC~~ SOSY
60.0000 mg | PREFILLED_SYRINGE | Freq: Once | SUBCUTANEOUS | Status: AC
  Administered 2021-09-18: 60 mg via SUBCUTANEOUS
  Filled 2021-09-18: qty 1

## 2021-09-18 NOTE — Progress Notes (Signed)
? ?Office Visit Note ?  ?Patient: Cindy Ross           ?Date of Birth: 1950/12/08           ?MRN: [id number] ?Visit Date: 09/18/2021 ?             ?Requested by: Glendale Chard, MD ?[address] ?[address] ?[place],  [place] [address] ?PCP: Glendale Chard, MD ? ?Chief Complaint  ?Patient presents with  ? Left Ankle - Follow-up  ?  Weber B fib fx about 4 weeks out   ? ? ? ? ?HPI: ?Patient is a 71 year old woman who is 4 weeks status post nondisplaced Weber B fibular fracture left ankle.  Patient is seen with an interpreter.  Patient had tibial pain from the immobilization and is currently wearing regular shoes. ? ?Assessment & Plan: ?Visit Diagnoses:  ?1. Closed nondisplaced fracture of lateral malleolus of left fibula, initial encounter   ? ? ?Plan: Recommended using the ASO as tolerated. ? ?Follow-Up Instructions: Return if symptoms worsen or fail to improve.  ? ?Ortho Exam ? ?Patient is alert, oriented, no adenopathy, well-dressed, normal affect, normal respiratory effort. ?Examination the deltoid ligament is minimally tender to palpation the fibula is minimally tender to palpation she has no pain with range of motion of the ankle she has no pain with weightbearing.  Radiographs shows good callus formation around the fracture. ? ?Imaging: ?XR Ankle Complete Left ? ?Result Date: 09/18/2021 ?Three-view radiographs of the right ankle shows a congruent mortise there is calcification around the fracture site no shortening of the fibula.  ?No images are attached to the encounter. ? ?Labs: ?Lab Results  ?Component Value Date  ? HGBA1C 5.9 (H) 09/04/2021  ? HGBA1C 6.3 (H) 04/26/2021  ? HGBA1C 6.2 (H) 10/03/2020  ? ? ? ?Lab Results  ?Component Value Date  ? ALBUMIN 4.8 09/04/2021  ? ALBUMIN 4.6 04/26/2021  ? ALBUMIN 4.6 01/20/2020  ? ? ?No results found for: MG ?Lab Results  ?Component Value Date  ? VD25OH 57 03/08/2021  ? VD25OH 61 09/12/2020  ? VD25OH 70 03/07/2020  ? ? ?No results found for: PREALBUMIN ? ?  Latest Ref Rng &  Units 04/26/2021  ? 10:40 AM 03/08/2021  ?  9:14 AM 09/12/2020  ?  1:36 PM  ?CBC EXTENDED  ?WBC 3.4 - 10.8 x10E3/uL 4.6   4.8   6.8    ?RBC 3.77 - 5.28 x10E6/uL 3.59   3.75   3.63    ?Hemoglobin 11.1 - 15.9 g/dL 10.6   11.2   10.9    ?HCT 34.0 - 46.6 % 32.5   34.9   34.4    ?Platelets 150 - 450 x10E3/uL 224   191   240    ?NEUT# 1,500 - 7,800 cells/uL  1,853   3,121    ?Lymph# 850 - 3,900 cells/uL  2,266   2,924    ? ? ? ?There is no height or weight on file to calculate BMI. ? ?Orders:  ?Orders Placed This Encounter  ?Procedures  ? XR Ankle Complete Left  ? ?No orders of the defined types were placed in this encounter. ? ? ? Procedures: ?No procedures performed ? ?Clinical Data: ?No additional findings. ? ?ROS: ? ?All other systems negative, except as noted in the HPI. ?Review of Systems ? ?Objective: ?Vital Signs: There were no vitals taken for this visit. ? ?Specialty Comments:  ?No specialty comments available. ? ?PMFS History: ?Patient Active Problem List  ? Diagnosis  Date Noted  ? BRBPR (bright red blood per rectum) 10/03/2020  ? Normocytic anemia 10/03/2020  ? Age-related osteoporosis without current pathological fracture 03/30/2020  ? Language barrier 03/30/2020  ? Respiratory crackles at left lung base 05/19/2017  ? Essential hypertension 01/30/2016  ? DM type 2 (diabetes mellitus, type 2) ([place]) 01/30/2016  ? Hyperlipidemia 01/30/2016  ? ?Past Medical History:  ?Diagnosis Date  ? Diabetes mellitus without complication ([place])   ? Hyperlipidemia 01/30/2016  ? Hypertension 2007  ? Respiratory crackles at left lung base 05/19/2017  ? CXR 02/2017 in [facility] normal  ?  ?Family History  ?Problem Relation Age of Onset  ? Hyperlipidemia Sister   ? Diabetes Sister   ? Hypertension Sister   ? Heart disease Brother   ? Healthy Son   ? Leukemia Brother   ?  ?History reviewed. No pertinent surgical history. ?Social History  ? ?Occupational History  ? Occupation: house cleaning.  ?  Comment: Worked for bakery in  past  ? Occupation: retired  ?Tobacco Use  ? Smoking status: Never  ?  Passive exposure: Never  ? Smokeless tobacco: Never  ?Vaping Use  ? Vaping Use: Never used  ?Substance and Sexual Activity  ? Alcohol use: No  ?  Alcohol/week: 0.0 standard drinks  ? Drug use: No  ? Sexual activity: Not Currently  ? ? ? ? ? ?

## 2021-09-24 ENCOUNTER — Other Ambulatory Visit: Payer: Self-pay | Admitting: Internal Medicine

## 2021-09-24 DIAGNOSIS — Z1231 Encounter for screening mammogram for malignant neoplasm of breast: Secondary | ICD-10-CM

## 2021-10-04 ENCOUNTER — Ambulatory Visit
Admission: RE | Admit: 2021-10-04 | Discharge: 2021-10-04 | Disposition: A | Discharge status: Home / Self Care | Payer: Medicare Other | Source: Ambulatory Visit | Attending: Internal Medicine | Admitting: Internal Medicine

## 2021-10-04 ENCOUNTER — Other Ambulatory Visit: Payer: Self-pay | Admitting: Internal Medicine

## 2021-10-04 DIAGNOSIS — Z1231 Encounter for screening mammogram for malignant neoplasm of breast: Secondary | ICD-10-CM | POA: Diagnosis not present

## 2021-10-23 ENCOUNTER — Other Ambulatory Visit: Payer: Self-pay

## 2021-10-23 DIAGNOSIS — E7800 Pure hypercholesterolemia, unspecified: Secondary | ICD-10-CM

## 2021-10-23 MED ORDER — ATORVASTATIN CALCIUM 20 MG PO TABS: 20.0000 mg | ORAL_TABLET | Freq: Every day | ORAL | 1 refills | Status: DC

## 2021-11-01 ENCOUNTER — Other Ambulatory Visit: Payer: Self-pay

## 2021-11-01 DIAGNOSIS — E2839 Other primary ovarian failure: Secondary | ICD-10-CM

## 2022-01-07 ENCOUNTER — Ambulatory Visit (INDEPENDENT_AMBULATORY_CARE_PROVIDER_SITE_OTHER): Payer: Medicare Other | Admitting: Internal Medicine

## 2022-01-07 ENCOUNTER — Encounter: Payer: Self-pay | Admitting: Internal Medicine

## 2022-01-07 VITALS — BP 120/78 | HR 81 | Temp 98.1°F | Ht 60.4 in | Wt 122.0 lb

## 2022-01-07 DIAGNOSIS — E785 Hyperlipidemia, unspecified: Secondary | ICD-10-CM

## 2022-01-07 DIAGNOSIS — D649 Anemia, unspecified: Secondary | ICD-10-CM | POA: Diagnosis not present

## 2022-01-07 DIAGNOSIS — I10 Essential (primary) hypertension: Secondary | ICD-10-CM | POA: Diagnosis not present

## 2022-01-07 DIAGNOSIS — E1169 Type 2 diabetes mellitus with other specified complication: Secondary | ICD-10-CM

## 2022-01-07 DIAGNOSIS — R634 Abnormal weight loss: Secondary | ICD-10-CM

## 2022-01-07 DIAGNOSIS — Z6823 Body mass index (BMI) 23.0-23.9, adult: Secondary | ICD-10-CM | POA: Diagnosis not present

## 2022-01-07 NOTE — Patient Instructions (Signed)

## 2022-01-07 NOTE — Progress Notes (Signed)
Rich Brave [place]leton,acting as a Education administrator for [name], MD.,have documented all relevant documentation on the behalf of [name], MD,as directed by  [name], MD while in the presence of [name], MD.    Subjective:     Patient ID: Cindy Ross , female    DOB: 12/10/1950 , 71 y.o.   MRN: [id number]   Chief Complaint  Patient presents with   Diabetes   Hypertension    HPI  She presents today for bp/dm check. She reports compliance with her meds. She denies headaches, chest pain and shortness of breath.    Diabetes She presents for her follow-up diabetic visit. She has type 2 diabetes mellitus. There are no hypoglycemic associated symptoms. Pertinent negatives for diabetes include no blurred vision, no polydipsia, no polyphagia and no polyuria. There are no hypoglycemic complications. She is compliant with treatment most of the time. She is following a diabetic diet. When asked about meal planning, she reported none. She participates in exercise intermittently. Her home blood glucose trend is fluctuating minimally. An ACE inhibitor/angiotensin II receptor blocker is being taken. Eye exam is not current.  Hypertension This is a chronic problem. The current episode started more than 1 year ago. The problem has been gradually improving since onset. The problem is controlled. Pertinent negatives include no blurred vision or palpitations. Risk factors for coronary artery disease include diabetes mellitus, dyslipidemia and post-menopausal state. The current treatment provides moderate improvement.     Past Medical History:  Diagnosis Date   Diabetes mellitus without complication ([place])    Hyperlipidemia 01/30/2016   Hypertension 2007   Respiratory crackles at left lung base 05/19/2017   CXR 02/2017 in Care Everywhere/[facility] normal     Family History  Problem Relation Age of Onset   Hyperlipidemia Sister    Diabetes Sister    Hypertension Sister    Heart disease  Brother    Healthy Son    Leukemia Brother      Current Outpatient Medications:    Accu-Chek Occupational psychologist MISC, : Use as directed to check blood sugars 2 times per day dx:e11.22, Disp: 300 each, Rfl: 2   ACCU-CHEK GUIDE test strip, : USE AS DIRECTED TO CHECK BLOOD SUGARS 2 TIMES PER DAY DX:E11.22, Disp: 200 strip, Rfl: 4   aspirin EC 81 MG tablet, Take 1 tablet (81 mg total) by mouth daily., Disp: , Rfl:    atorvastatin (LIPITOR) 20 MG tablet, Take 1 tablet (20 mg total) by mouth daily., Disp: 90 tablet, Rfl: 1   Blood Glucose Monitoring Suppl (ACCU-CHEK GUIDE) w/Device KIT, : Use as directed to check blood sugars 2 times per day dx:e11.22, Disp: 1 kit, Rfl: 1   cholecalciferol (VITAMIN D) 1000 units tablet, Take 1,000 Units by mouth daily., Disp: , Rfl:    CLENPIQ 10-3.5-12 MG-GM -GM/160ML SOLN, , Disp: , Rfl:    denosumab (PROLIA) 60 MG/ML SOSY injection, Inject 60 mg into the skin every 6 (six) months. Courier to rheum: [address] [place], [place] [address]. Appt on 09/18/21, Disp: 1 mL, Rfl: 0   metFORMIN (GLUCOPHAGE) 1000 MG tablet, Take 1 tablet (1,000 mg total) by mouth 2 (two) times daily., Disp: 180 tablet, Rfl: 2   Multiple Vitamins-Minerals (ONE-A-DAY WOMENS 50+ ADVANTAGE PO), Take 1 tablet by mouth daily at 12 noon., Disp: , Rfl:    olmesartan-hydrochlorothiazide (BENICAR HCT) 40-12.5 MG tablet, TAKE 1 TABLET BY MOUTH EVERY DAY, Disp: 90 tablet, Rfl: 1  No Known Allergies   Review of Systems  Constitutional:  Positive for appetite change.       She states she has decreased appetite. "I can't eat the food" She has cut back on her meat intake - states she does okay with fish. Denies abdominal pain, n/v.   Eyes: Negative.  Negative for blurred vision.  Respiratory: Negative.    Cardiovascular: Negative.  Negative for palpitations.  Gastrointestinal: Negative.  Negative for abdominal distention, abdominal pain, blood in stool and constipation.  Endocrine: Negative  for polydipsia, polyphagia and polyuria.  Musculoskeletal: Negative.   Skin: Negative.   Neurological: Negative.   Psychiatric/Behavioral: Negative.       Today's Vitals   01/07/22 0907  BP: 120/78  Pulse: 81  Temp: 98.1 F (36.7 C)  Weight: 122 lb (55.3 kg)  Height: 5' 0.4" (1.534 m)  PainSc: 0-No pain   Body mass index is 23.51 kg/m.  Wt Readings from Last 3 Encounters:  01/07/22 122 lb (55.3 kg)  09/06/21 131 lb (59.4 kg)  09/04/21 129 lb (58.5 kg)     Objective:  Physical Exam Vitals and nursing note reviewed.  Constitutional:      Appearance: Normal appearance.  HENT:     Head: Normocephalic and atraumatic.     Nose:     Comments: Masked     Mouth/Throat:     Comments: Masked  Eyes:     Extraocular Movements: Extraocular movements intact.  Cardiovascular:     Rate and Rhythm: Normal rate and regular rhythm.     Heart sounds: Normal heart sounds.  Pulmonary:     Effort: Pulmonary effort is normal.     Breath sounds: Normal breath sounds.  Abdominal:     General: Bowel sounds are normal. There is no distension.     Palpations: Abdomen is soft. There is no mass.     Tenderness: There is no abdominal tenderness. There is no guarding.  Musculoskeletal:     Cervical back: Normal range of motion.  Skin:    General: Skin is warm.  Neurological:     General: No focal deficit present.     Mental Status: She is alert.  Psychiatric:        Mood and Affect: Mood normal.        Behavior: Behavior normal.      Assessment And Plan:     1. Type 2 diabetes mellitus with hyperlipidemia ([place]) Comments: She will c/w metformin BID for now. I will adjust meds as needed.  She will f/u in Dec 2023 for her next physical examination.  - Hemoglobin A1c  2. Essential hypertension Comments: Chronic, well controlled.  She will c/w olmesartan/hctz. I will check renal function today.  - CMP14+EGFR  3. Unintentional weight loss Comments: I will check labs as below. She is  agreeable to GI eval for further evaluation of early satiety. If normal, will consider mirtazapine to help with appetite.  - CBC no Diff - TSH  4. BMI 23.0-23.9, adult Comments: She has lost 9 lbs since March without trying. Reports food doesn't taste the same and upon further questioning she does admit to early satiety.    Patient was given opportunity to ask questions. Patient verbalized understanding of the plan and was able to repeat key elements of the plan. All questions were answered to their satisfaction.   I, [name], MD, have reviewed all documentation for this visit. The documentation on 01/07/22 for the exam, diagnosis, procedures, and  orders are all accurate and complete.   IF YOU HAVE BEEN REFERRED TO A SPECIALIST, IT MAY TAKE 1-2 WEEKS TO SCHEDULE/PROCESS THE REFERRAL. IF YOU HAVE NOT HEARD FROM US/SPECIALIST IN TWO WEEKS, PLEASE GIVE [place] A CALL AT [phone].   THE PATIENT IS ENCOURAGED TO PRACTICE SOCIAL DISTANCING DUE TO THE COVID-19 PANDEMIC.

## 2022-01-08 LAB — CBC
Hematocrit: 30.3 % — ABNORMAL LOW (ref 34.0–46.6)
Hemoglobin: 9.7 g/dL — ABNORMAL LOW (ref 11.1–15.9)
MCH: 30.4 pg (ref 26.6–33.0)
MCHC: 32 g/dL (ref 31.5–35.7)
MCV: 95 fL (ref 79–97)
Platelets: 205 10*3/uL (ref 150–450)
RBC: 3.19 x10E6/uL — ABNORMAL LOW (ref 3.77–5.28)
RDW: 12.9 % (ref 11.7–15.4)
WBC: 4.2 10*3/uL (ref 3.4–10.8)

## 2022-01-08 LAB — CMP14+EGFR
ALT: 15 IU/L (ref 0–32)
AST: 21 IU/L (ref 0–40)
Albumin/Globulin Ratio: 1.4 (ref 1.2–2.2)
Albumin: 4.6 g/dL (ref 3.9–4.9)
Alkaline Phosphatase: 53 IU/L (ref 44–121)
BUN/Creatinine Ratio: 22 (ref 12–28)
BUN: 24 mg/dL (ref 8–27)
Bilirubin Total: 0.3 mg/dL (ref 0.0–1.2)
CO2: 21 mmol/L (ref 20–29)
Calcium: 10.3 mg/dL (ref 8.7–10.3)
Chloride: 108 mmol/L — ABNORMAL HIGH (ref 96–106)
Creatinine, Ser: 1.08 mg/dL — ABNORMAL HIGH (ref 0.57–1.00)
Globulin, Total: 3.3 g/dL (ref 1.5–4.5)
Glucose: 94 mg/dL (ref 70–99)
Potassium: 5.3 mmol/L — ABNORMAL HIGH (ref 3.5–5.2)
Sodium: 140 mmol/L (ref 134–144)
Total Protein: 7.9 g/dL (ref 6.0–8.5)
eGFR: 56 mL/min/{1.73_m2} — ABNORMAL LOW (ref >59)

## 2022-01-08 LAB — HEMOGLOBIN A1C
Est. average glucose Bld gHb Est-mCnc: 120 mg/dL
Hgb A1c MFr Bld: 5.8 % — ABNORMAL HIGH (ref 4.8–5.6)

## 2022-01-08 LAB — TSH: TSH: 1.56 u[IU]/mL (ref 0.450–4.500)

## 2022-01-10 DIAGNOSIS — E113293 Type 2 diabetes mellitus with mild nonproliferative diabetic retinopathy without macular edema, bilateral: Secondary | ICD-10-CM | POA: Diagnosis not present

## 2022-01-10 LAB — IRON AND TIBC
Iron Saturation: 33 % (ref 15–55)
Iron: 93 ug/dL (ref 27–139)
Total Iron Binding Capacity: 279 ug/dL (ref 250–450)
UIBC: 186 ug/dL (ref 118–369)

## 2022-01-10 LAB — FERRITIN: Ferritin: 270 ng/mL — ABNORMAL HIGH (ref 15–150)

## 2022-01-10 LAB — HM DIABETES EYE EXAM

## 2022-01-10 LAB — SPECIMEN STATUS REPORT

## 2022-02-05 ENCOUNTER — Other Ambulatory Visit: Payer: Self-pay | Admitting: Internal Medicine

## 2022-02-19 NOTE — Progress Notes (Unsigned)
Office Visit Note  Patient: Cindy Ross             Date of Birth: 1950-12-10           MRN: [id number]             PCP: Glendale Chard, MD Referring: Glendale Chard, MD Visit Date: 03/05/2022 Occupation: '@GUAROCC'$ @  Subjective:  Pain in both hands   History of Present Illness: Cindy Ross is a 71 y.o. female with history of osteoporosis and osteoarthritis.  She denies any recent falls or fractures.  Her last prolia injection was administered on 09/18/21.  She has tolerated Prolia without any side effects or injection site reactions.  She continues to take vitamin D 1000 units daily.  She is aware that she is due for her Prolia injection in October 2023.  She has not yet scheduled an updated bone density. Patient reports that she has been experiencing intermittent pain and stiffness in both hands.  She denies any joint swelling.  She also has intermittent discomfort in her lower back and both knees especially when standing or walking for prolonged periods of time.  She has not been taking any over-the-counter products for pain relief or using any ice or heat topically.  Activities of Daily Living:  Patient reports morning stiffness for a few minutes.   Patient Denies nocturnal pain.  Difficulty dressing/grooming: Denies Difficulty climbing stairs: Denies Difficulty getting out of chair: Denies Difficulty using hands for taps, buttons, cutlery, and/or writing: Reports  Review of Systems  Constitutional:  Negative for fatigue.  HENT:  Negative for mouth sores and mouth dryness.   Eyes:  Negative for dryness.  Respiratory:  Negative for shortness of breath.   Cardiovascular:  Negative for chest pain and palpitations.  Gastrointestinal:  Negative for constipation and diarrhea.  Endocrine: Negative for increased urination.  Genitourinary:  Negative for involuntary urination.  Musculoskeletal:  Positive for joint pain, joint pain, myalgias and myalgias. Negative for gait problem, joint  swelling, muscle weakness, morning stiffness and muscle tenderness.  Skin:  Positive for hair loss. Negative for color change, rash and sensitivity to sunlight.  Allergic/Immunologic: Negative for susceptible to infections.  Neurological:  Negative for dizziness and headaches.  Hematological:  Negative for swollen glands.  Psychiatric/Behavioral:  Negative for depressed mood and sleep disturbance. The patient is not nervous/anxious.     PMFS History:  Patient Active Problem List   Diagnosis Date Noted   BRBPR (bright red blood per rectum) 10/03/2020   Normocytic anemia 10/03/2020   Age-related osteoporosis without current pathological fracture 03/30/2020   Language barrier 03/30/2020   Respiratory crackles at left lung base 05/19/2017   Essential hypertension 01/30/2016   DM type 2 (diabetes mellitus, type 2) ([place]) 01/30/2016   Hyperlipidemia 01/30/2016    Past Medical History:  Diagnosis Date   Diabetes mellitus without complication ([place])    Hyperlipidemia 01/30/2016   Hypertension 2007   Respiratory crackles at left lung base 05/19/2017   CXR 02/2017 in [facility] normal    Family History  Problem Relation Age of Onset   Hyperlipidemia Sister    Diabetes Sister    Hypertension Sister    Heart disease Brother    Healthy Son    Leukemia Brother    History reviewed. No pertinent surgical history. Social History   Social History Narrative   Originally from [place]   Came to [place] In 56   Lives with younger sister, Cindy Ross  lives in [place].  Works in [employer].     Immunization History  Administered Date(s) Administered   Fluad Quad(high Dose 65+) 04/18/2020, 04/02/2021, 02/28/2022   Influenza Inj Mdck Quad Pf 07/31/2016, 05/19/2017   Influenza, High Dose Seasonal PF 04/30/2018, 03/30/2019   Influenza-Unspecified 04/17/2018, 03/30/2019   PFIZER Comirnaty(Gray Top)Covid-19 Tri-Sucrose Vaccine 12/14/2020   PFIZER(Purple Top)SARS-COV-2  Vaccination 07/23/2019, 08/13/2019, 03/14/2020   PNEUMOCOCCAL CONJUGATE-20 08/31/2020   Pneumococcal Conjugate-13 09/14/2018, 12/29/2018   Pneumococcal Polysaccharide-23 03/19/2016   Td 10/31/1994, 04/10/1995   Tdap 01/30/2016   Zoster Recombinat (Shingrix) 05/06/2019, 07/12/2019     Objective: Vital Signs: BP 114/65 (BP Location: Right Arm, Patient Position: Sitting, Cuff Size: Normal)   Pulse 71   Resp 14   Ht 5' 0.4" (1.534 m)   Wt 122 lb 9.6 oz (55.6 kg)   BMI 23.63 kg/m    Physical Exam Vitals and nursing note reviewed.  Constitutional:      Appearance: She is well-developed.  HENT:     Head: Normocephalic and atraumatic.  Eyes:     Conjunctiva/sclera: Conjunctivae normal.  Cardiovascular:     Rate and Rhythm: Normal rate and regular rhythm.     Heart sounds: Normal heart sounds.  Pulmonary:     Effort: Pulmonary effort is normal.     Breath sounds: Normal breath sounds.  Abdominal:     General: Bowel sounds are normal.     Palpations: Abdomen is soft.  Musculoskeletal:     Cervical back: Normal range of motion.  Skin:    General: Skin is warm and dry.     Capillary Refill: Capillary refill takes less than 2 seconds.  Neurological:     Mental Status: She is alert and oriented to person, place, and time.  Psychiatric:        Behavior: Behavior normal.      Musculoskeletal Exam: C-spine has good range of motion with no discomfort.  Some tenderness over both SI joints.  No midline spinal tenderness.  Shoulder joints, elbow joints, wrist joints, MCPs, PIPs, DIPs have good range of motion with no synovitis.  She has PIP and DIP thickening consistent with osteoarthritis of both hands.  Subluxation of several DIP joints noted.  Complete fist formation bilaterally.  Hip joints have good range of motion with no groin pain.  Knee joints have good range of motion with no warmth or effusion.  Ankle joints have good range of motion with no tenderness or joint swelling.  No  tenderness over MTP joints.  No evidence of Achilles tendinitis or plantar fasciitis.  CDAI Exam: CDAI Score: -- Patient Global: --; Provider Global: -- Swollen: --; Tender: -- Joint Exam 03/05/2022   No joint exam has been documented for this visit   There is currently no information documented on the homunculus. Go to the Rheumatology activity and complete the homunculus joint exam.  Investigation: No additional findings.  Imaging: No results found.  Recent Labs: Lab Results  Component Value Date   WBC 5.5 02/28/2022   HGB 10.0 (L) 02/28/2022   PLT 263 02/28/2022   NA 140 01/07/2022   K 5.3 (H) 01/07/2022   CL 108 (H) 01/07/2022   CO2 21 01/07/2022   GLUCOSE 94 01/07/2022   BUN 24 01/07/2022   CREATININE 1.08 (H) 01/07/2022   BILITOT 0.3 01/07/2022   ALKPHOS 53 01/07/2022   AST 21 01/07/2022   ALT 15 01/07/2022   PROT 7.9 01/07/2022   ALBUMIN 4.6 01/07/2022   CALCIUM  10.3 01/07/2022   GFRAA 65 09/12/2020    Speciality Comments:  DEXA at [facility]  Procedures:  No procedures performed Allergies: Patient has no known allergies.   Assessment / Plan:     Visit Diagnoses: Age-related osteoporosis without current pathological fracture - DEXA 11/02/19: BMD measured at Femur Neck Right is 0.573 g/cm2 with a T-score of -3.3.  Patient is overdue to update bone density.  Future order remains in place.  Patient was given the phone number to call the breast center to schedule an updated bone density.  No recent falls or fractures. She continues to take vitamin D 1000 units daily. Her last Prolia injection was on 09/18/2021.  She has tolerated Prolia without any side effects or injection site reactions.  CBC was drawn on 02/28/2022.  CMP and vitamin D will be rechecked today.  She will be due to update Prolia in October 2023. She will follow up in the office in 6 months or sooner if needed.   Vitamin D deficiency -She is taking vitamin D 1000 units daily.  Vitamin D  level will be checked today.  Plan: VITAMIN D 25 Hydroxy (Vit-D Deficiency, Fractures)  Medication monitoring encounter -CBC drawn on 02/28/2022.  CMP and vitamin D will be checked today prior to scheduling her next Prolia injection in October 2023.  Plan: COMPLETE METABOLIC PANEL WITH GFR, VITAMIN D 25 Hydroxy (Vit-D Deficiency, Fractures)  Primary osteoarthritis of both hands: She has PIP and DIP thickening consistent with osteoarthritis of both hands.  No synovitis noted.  Complete fist formation bilaterally.  Discussed the importance of joint protection and muscle strengthening.  She was given a handout of hand exercises to perform.  Several of these exercises were demonstrated for the patient today in the office.  I also discussed the use of arthritis compression gloves.  History of recent fall: No recent falls.  Acute pain of left knee - X-ray of the knee joint was consistent with mild osteoarthritis and mild chondromalacia patella.  Good range of motion of the left knee joint.  No warmth or effusion noted.  Acute left ankle pain - The x-ray revealed left nondisplaced fibular fracture. She was referred to Dr. Sharol Given  Other medical conditions are listed as follows:  Pure hypercholesterolemia  Essential hypertension: Blood pressure was 114/65 today in the office.  Type 2 diabetes mellitus without complication, without long-term current use of insulin ([place])  Language barrier  Orders: Orders Placed This Encounter  Procedures   COMPLETE METABOLIC PANEL WITH GFR   VITAMIN D 25 Hydroxy (Vit-D Deficiency, Fractures)   No orders of the defined types were placed in this encounter.    Follow-Up Instructions: Return in about 6 months (around 09/03/2022) for Osteoarthritis, Osteoporosis.   Ofilia Neas, PA-C  Note - This record has been created using Dragon software.  Chart creation errors have been sought, but may not always  have been located. Such creation errors do not reflect on   the standard of medical care.

## 2022-02-27 ENCOUNTER — Other Ambulatory Visit (HOSPITAL_COMMUNITY): Payer: Self-pay

## 2022-02-27 ENCOUNTER — Telehealth: Payer: Self-pay | Admitting: Pharmacist

## 2022-02-27 DIAGNOSIS — M810 Age-related osteoporosis without current pathological fracture: Secondary | ICD-10-CM

## 2022-02-27 DIAGNOSIS — Z79899 Other long term (current) drug therapy: Secondary | ICD-10-CM

## 2022-02-27 NOTE — Telephone Encounter (Signed)
Patient due for next Prolia on 03/17/22. She has OV on 03/05/22 and can have labs completed at that visit. Copay through pharmacy benefit is $4.30.  Once labs result and if wnl, will send rx to [facility] to be couriered to clinic  Knox Saliva, PharmD, MPH, BCPS, CPP Clinical Pharmacist (Rheumatology and Pulmonology)

## 2022-02-28 ENCOUNTER — Ambulatory Visit (INDEPENDENT_AMBULATORY_CARE_PROVIDER_SITE_OTHER): Payer: Medicare Other

## 2022-02-28 ENCOUNTER — Encounter: Payer: Self-pay | Admitting: Internal Medicine

## 2022-02-28 ENCOUNTER — Ambulatory Visit (INDEPENDENT_AMBULATORY_CARE_PROVIDER_SITE_OTHER): Payer: Medicare Other | Admitting: Internal Medicine

## 2022-02-28 VITALS — BP 110/68 | HR 84 | Temp 98.4°F | Ht 64.0 in | Wt 124.0 lb

## 2022-02-28 VITALS — Ht 64.0 in | Wt 122.0 lb

## 2022-02-28 DIAGNOSIS — E7800 Pure hypercholesterolemia, unspecified: Secondary | ICD-10-CM

## 2022-02-28 DIAGNOSIS — K625 Hemorrhage of anus and rectum: Secondary | ICD-10-CM

## 2022-02-28 DIAGNOSIS — Z0000 Encounter for general adult medical examination without abnormal findings: Secondary | ICD-10-CM | POA: Diagnosis not present

## 2022-02-28 DIAGNOSIS — Z6821 Body mass index (BMI) 21.0-21.9, adult: Secondary | ICD-10-CM

## 2022-02-28 DIAGNOSIS — R630 Anorexia: Secondary | ICD-10-CM

## 2022-02-28 DIAGNOSIS — K644 Residual hemorrhoidal skin tags: Secondary | ICD-10-CM | POA: Diagnosis not present

## 2022-02-28 DIAGNOSIS — N1831 Chronic kidney disease, stage 3a: Secondary | ICD-10-CM

## 2022-02-28 DIAGNOSIS — E1122 Type 2 diabetes mellitus with diabetic chronic kidney disease: Secondary | ICD-10-CM

## 2022-02-28 DIAGNOSIS — Z23 Encounter for immunization: Secondary | ICD-10-CM

## 2022-02-28 DIAGNOSIS — E119 Type 2 diabetes mellitus without complications: Secondary | ICD-10-CM

## 2022-02-28 LAB — CBC
Hematocrit: 30.9 % — ABNORMAL LOW (ref 34.0–46.6)
Hemoglobin: 10 g/dL — ABNORMAL LOW (ref 11.1–15.9)
MCH: 30.8 pg (ref 26.6–33.0)
MCHC: 32.4 g/dL (ref 31.5–35.7)
MCV: 95 fL (ref 79–97)
Platelets: 263 10*3/uL (ref 150–450)
RBC: 3.25 x10E6/uL — ABNORMAL LOW (ref 3.77–5.28)
RDW: 12.5 % (ref 11.7–15.4)
WBC: 5.5 10*3/uL (ref 3.4–10.8)

## 2022-02-28 MED ORDER — METFORMIN HCL 1000 MG PO TABS: 1000.0000 mg | ORAL_TABLET | Freq: Two times a day (BID) | ORAL | 2 refills | Status: DC

## 2022-02-28 MED ORDER — ACCU-CHEK GUIDE VI STRP: ORAL_STRIP | 4 refills | Status: AC

## 2022-02-28 MED ORDER — ACCU-CHEK FASTCLIX LANCETS MISC: 2 refills | Status: AC

## 2022-02-28 MED ORDER — ATORVASTATIN CALCIUM 20 MG PO TABS: 20.0000 mg | ORAL_TABLET | Freq: Every day | ORAL | 1 refills | Status: DC

## 2022-02-28 NOTE — Patient Instructions (Signed)
Cindy Ross , Thank you for taking time to come for your Medicare Wellness Visit. I appreciate your ongoing commitment to your health goals. Please review the following plan we discussed and let me know if I can assist you in the future.   Screening recommendations/referrals: Colonoscopy: completed 03/19/2021, due 03/19/2025 Mammogram: completed 10/04/2021, due 10/06/2022 Bone Density: completed 11/02/2019 Recommended yearly ophthalmology/optometry visit for glaucoma screening and checkup Recommended yearly dental visit for hygiene and checkup  Vaccinations: Influenza vaccine: due Pneumococcal vaccine: completed 08/31/2020 Tdap vaccine: completed 01/30/2016, due 01/29/2026 Shingles vaccine: completed   Covid-19: 12/14/2020, 03/14/2020, 08/13/2019, 07/23/2019  Advanced directives: Advance directive discussed with you today.   Conditions/risks identified: none  Next appointment: Follow up in one year for your annual wellness visit    Preventive Care 65 Years and Older, Female Preventive care refers to lifestyle choices and visits with your health care provider that can promote health and wellness. What does preventive care include? A yearly physical exam. This is also called an annual well check. Dental exams once or twice a year. Routine eye exams. Ask your health care provider how often you should have your eyes checked. Personal lifestyle choices, including: Daily care of your teeth and gums. Regular physical activity. Eating a healthy diet. Avoiding tobacco and drug use. Limiting alcohol use. Practicing safe sex. Taking low-dose aspirin every day. Taking vitamin and mineral supplements as recommended by your health care provider. What happens during an annual well check? The services and screenings done by your health care provider during your annual well check will depend on your age, overall health, lifestyle risk factors, and family history of disease. Counseling  Your health care  provider may ask you questions about your: Alcohol use. Tobacco use. Drug use. Emotional well-being. Home and relationship well-being. Sexual activity. Eating habits. History of falls. Memory and ability to understand (cognition). Work and work Statistician. Reproductive health. Screening  You may have the following tests or measurements: Height, weight, and BMI. Blood pressure. Lipid and cholesterol levels. These may be checked every 5 years, or more frequently if you are over 40 years old. Skin check. Lung cancer screening. You may have this screening every year starting at age 35 if you have a 30-pack-year history of smoking and currently smoke or have quit within the past 15 years. Fecal occult blood test (FOBT) of the stool. You may have this test every year starting at age 49. Flexible sigmoidoscopy or colonoscopy. You may have a sigmoidoscopy every 5 years or a colonoscopy every 10 years starting at age 44. Hepatitis C blood test. Hepatitis B blood test. Sexually transmitted disease (STD) testing. Diabetes screening. This is done by checking your blood sugar (glucose) after you have not eaten for a while (fasting). You may have this done every 1-3 years. Bone density scan. This is done to screen for osteoporosis. You may have this done starting at age 54. Mammogram. This may be done every 1-2 years. Talk to your health care provider about how often you should have regular mammograms. Talk with your health care provider about your test results, treatment options, and if necessary, the need for more tests. Vaccines  Your health care provider may recommend certain vaccines, such as: Influenza vaccine. This is recommended every year. Tetanus, diphtheria, and acellular pertussis (Tdap, Td) vaccine. You may need a Td booster every 10 years. Zoster vaccine. You may need this after age 7. Pneumococcal 13-valent conjugate (PCV13) vaccine. One dose is recommended after age  56. Pneumococcal  polysaccharide (PPSV23) vaccine. One dose is recommended after age 58. Talk to your health care provider about which screenings and vaccines you need and how often you need them. This information is not intended to replace advice given to you by your health care provider. Make sure you discuss any questions you have with your health care provider. Document Released: 06/30/2015 Document Revised: 02/21/2016 Document Reviewed: 04/04/2015 Elsevier Interactive Patient Education  2017 [place] Prevention in the Home Falls can cause injuries. They can happen to people of all ages. There are many things you can do to make your home safe and to help prevent falls. What can I do on the outside of my home? Regularly fix the edges of walkways and driveways and fix any cracks. Remove anything that might make you trip as you walk through a door, such as a raised step or threshold. Trim any bushes or trees on the path to your home. Use bright outdoor lighting. Clear any walking paths of anything that might make someone trip, such as rocks or tools. Regularly check to see if handrails are loose or broken. Make sure that both sides of any steps have handrails. Any raised decks and porches should have guardrails on the edges. Have any leaves, snow, or ice cleared regularly. Use sand or salt on walking paths during winter. Clean up any spills in your garage right away. This includes oil or grease spills. What can I do in the bathroom? Use night lights. Install grab bars by the toilet and in the tub and shower. Do not use towel bars as grab bars. Use non-skid mats or decals in the tub or shower. If you need to sit down in the shower, use a plastic, non-slip stool. Keep the floor dry. Clean up any water that spills on the floor as soon as it happens. Remove soap buildup in the tub or shower regularly. Attach bath mats securely with double-sided non-slip rug tape. Do not have throw  rugs and other things on the floor that can make you trip. What can I do in the bedroom? Use night lights. Make sure that you have a light by your bed that is easy to reach. Do not use any sheets or blankets that are too big for your bed. They should not hang down onto the floor. Have a firm chair that has side arms. You can use this for support while you get dressed. Do not have throw rugs and other things on the floor that can make you trip. What can I do in the kitchen? Clean up any spills right away. Avoid walking on wet floors. Keep items that you use a lot in easy-to-reach places. If you need to reach something above you, use a strong step stool that has a grab bar. Keep electrical cords out of the way. Do not use floor polish or wax that makes floors slippery. If you must use wax, use non-skid floor wax. Do not have throw rugs and other things on the floor that can make you trip. What can I do with my stairs? Do not leave any items on the stairs. Make sure that there are handrails on both sides of the stairs and use them. Fix handrails that are broken or loose. Make sure that handrails are as long as the stairways. Check any carpeting to make sure that it is firmly attached to the stairs. Fix any carpet that is loose or worn. Avoid having throw rugs at the top or  bottom of the stairs. If you do have throw rugs, attach them to the floor with carpet tape. Make sure that you have a light switch at the top of the stairs and the bottom of the stairs. If you do not have them, ask someone to add them for you. What else can I do to help prevent falls? Wear shoes that: Do not have high heels. Have rubber bottoms. Are comfortable and fit you well. Are closed at the toe. Do not wear sandals. If you use a stepladder: Make sure that it is fully opened. Do not climb a closed stepladder. Make sure that both sides of the stepladder are locked into place. Ask someone to hold it for you, if  possible. Clearly mark and make sure that you can see: Any grab bars or handrails. First and last steps. Where the edge of each step is. Use tools that help you move around (mobility aids) if they are needed. These include: Canes. Walkers. Scooters. Crutches. Turn on the lights when you go into a dark area. Replace any light bulbs as soon as they burn out. Set up your furniture so you have a clear path. Avoid moving your furniture around. If any of your floors are uneven, fix them. If there are any pets around you, be aware of where they are. Review your medicines with your doctor. Some medicines can make you feel dizzy. This can increase your chance of falling. Ask your doctor what other things that you can do to help prevent falls. This information is not intended to replace advice given to you by your health care provider. Make sure you discuss any questions you have with your health care provider. Document Released: 03/30/2009 Document Revised: 11/09/2015 Document Reviewed: 07/08/2014 Elsevier Interactive Patient Education  2017 [employer].

## 2022-02-28 NOTE — Progress Notes (Signed)
I connected with Cindy Ross today by telephone and verified that I am speaking with the correct person using two identifiers. Location patient: home Location provider: work Persons participating in the virtual visit: Jarelly Marina Gravel LPN.   I discussed the limitations, risks, security and privacy concerns of performing an evaluation and management service by telephone and the availability of in person appointments. I also discussed with the patient that there may be a patient responsible charge related to this service. The patient expressed understanding and verbally consented to this telephonic visit.    Interactive audio and video telecommunications were attempted between this provider and patient, however failed, due to patient having technical difficulties OR patient did not have access to video capability.  We continued and completed visit with audio only.     Vital signs may be patient reported or missing.  Subjective:   Cindy Ross is a 71 y.o. female who presents for Medicare Annual (Subsequent) preventive examination.  Review of Systems     Cardiac Risk Factors include: advanced age (>67mn, >>23women);diabetes mellitus;dyslipidemia;hypertension     Objective:    Today's Vitals   02/28/22 0816  Weight: 122 lb (55.3 kg)  Height: _0  (1.626 m)   Body mass index is 20.94 kg/m.     02/28/2022    8:24 AM 02/22/2021    9:34 AM 01/20/2020   12:25 PM 12/15/2018    8:59 AM 11/18/2016    9:15 AM  Advanced Directives  Does Patient Have a Medical Advance Directive? _1   Would patient like information on creating a medical advance directive?   No - Patient declined Yes (MAU/Ambulatory/Procedural Areas - Information given) Yes (MAU/Ambulatory/Procedural Areas - Information given)    Current Medications (verified) Outpatient Encounter Medications as of 02/28/2022  Medication Sig   Accu-Chek FastClix Lancets MISC : Use as directed to check blood sugars 2 times per day  dx:e11.22   ACCU-CHEK GUIDE test strip : USE AS DIRECTED TO CHECK BLOOD SUGARS 2 TIMES PER DAY DX:E11.22   aspirin EC 81 MG tablet Take 1 tablet (81 mg total) by mouth daily.   atorvastatin (LIPITOR) 20 MG tablet Take 1 tablet (20 mg total) by mouth daily.   Blood Glucose Monitoring Suppl (ACCU-CHEK GUIDE) w/Device KIT : Use as directed to check blood sugars 2 times per day dx:e11.22   cholecalciferol (VITAMIN D) 1000 units tablet Take 1,000 Units by mouth daily.   denosumab (PROLIA) 60 MG/ML SOSY injection Inject 60 mg into the skin every 6 (six) months. Courier to rheum: [id number] [id number] Appt on 09/18/21   metFORMIN (GLUCOPHAGE) 1000 MG tablet Take 1 tablet (1,000 mg total) by mouth 2 (two) times daily.   Multiple Vitamins-Minerals (ONE-A-DAY WOMENS 50+ ADVANTAGE PO) Take 1 tablet by mouth daily at 12 noon.   olmesartan-hydrochlorothiazide (BENICAR HCT) 40-12.5 MG tablet TAKE 1 TABLET BY MOUTH EVERY DAY   CLENPIQ 10-3.5-12 MG-GM -GM/160ML SOLN    No facility-administered encounter medications on file as of 02/28/2022.    Allergies (verified) Patient has no known allergies.   History: Past Medical History:  Diagnosis Date   Diabetes mellitus without complication ([place]    Hyperlipidemia 01/30/2016   Hypertension 2007   Respiratory crackles at left lung base 05/19/2017   CXR 02/2017 in Care Everywhere/[facility] normal   History reviewed. No pertinent surgical history. Family History  Problem Relation Age of Onset   Hyperlipidemia Sister    Diabetes Sister  Hypertension Sister    Heart disease Brother    Healthy Son    Leukemia Brother    Social History   Socioeconomic History   Marital status: Widowed    Spouse name: Not on file   Number of children: 1   Years of education: 41   Highest education level: Not on file  Occupational History   Occupation: house cleaning.    Comment: Worked for bakery in past   Occupation: retired  Tobacco Use    Smoking status: Never    Passive exposure: Never   Smokeless tobacco: Never  Vaping Use   Vaping Use: Never used  Substance and Sexual Activity   Alcohol use: No    Alcohol/week: 0.0 standard drinks of alcohol   Drug use: No   Sexual activity: Not Currently  Other Topics Concern   Not on file  Social History Narrative   Originally from [place]   Came to [place] In 27   Lives with younger sister, Ruvi Fullenwider   Son lives in [place].  Works in [employer].     Social Determinants of Health   Financial Resource Strain: Low Risk  (02/28/2022)   Overall Financial Resource Strain (CARDIA)    Difficulty of Paying Living Expenses: Not hard at all  Food Insecurity: No Food Insecurity (02/28/2022)   Hunger Vital Sign    Worried About Running Out of Food in the Last Year: Never true    Ran Out of Food in the Last Year: Never true  Transportation Needs: No Transportation Needs (02/28/2022)   PRAPARE - Hydrologist (Medical): No    Lack of Transportation (Non-Medical): No  Physical Activity: Sufficiently Active (02/28/2022)   Exercise Vital Sign    Days of Exercise per Week: 7 days    Minutes of Exercise per Session: 30 min  Stress: No Stress Concern Present (02/28/2022)   [place]    Feeling of Stress : Not at all  Social Connections: Unknown (12/15/2018)   Social Connection and Isolation Panel [NHANES]    Frequency of Communication with Friends and Family: Not asked    Frequency of Social Gatherings with Friends and Family: Not asked    Attends Religious Services: Not asked    Active Member of Clubs or Organizations: Not asked    Attends Archivist Meetings: Not asked    Marital Status: Not asked    Tobacco Counseling Counseling given: Not Answered   Clinical Intake:  Pre-visit preparation completed: Yes  Pain : No/denies pain     Nutritional Status: BMI of 19-24   Normal Nutritional Risks: None Diabetes: Yes  How often do you need to have someone help you when you read instructions, pamphlets, or other written materials from your doctor or pharmacy?: 4 - Often  Diabetic? Yes Nutrition Risk Assessment:  Has the patient had any N/V/D within the last 2 months?  No  Does the patient have any non-healing wounds?  No  Has the patient had any unintentional weight loss or weight gain?  Yes   Diabetes:  Is the patient diabetic?  Yes  If diabetic, was a CBG obtained today?  No  Did the patient bring in their glucometer from home?  No  How often do you monitor your CBG's? daily.   Financial Strains and Diabetes Management:  Are you having any financial strains with the device, your supplies or your medication? No .  Does the patient want to be seen by Chronic Care Management for management of their diabetes?  No  Would the patient like to be referred to a Nutritionist or for Diabetic Management?  No   Diabetic Exams:  Diabetic Eye Exam: Completed 04/04/2021 Diabetic Foot Exam: Completed 09/04/2021   Interpreter Needed?: Yes Interpreter Agency: [facility] Interpreter Name: Claiborne Billings  CommentsNelwyn Salisbury LPN   Activities of Daily Living    02/28/2022    8:27 AM  In your present state of health, do you have any difficulty performing the following activities:  Hearing? 0  Vision? 0  Difficulty concentrating or making decisions? 0  Walking or climbing stairs? 0  Dressing or bathing? 0  Doing errands, shopping? 0  Preparing Food and eating ? N  Using the Toilet? N  In the past six months, have you accidently leaked urine? N  Do you have problems with loss of bowel control? N  Managing your Medications? N  Managing your Finances? N  Housekeeping or managing your Housekeeping? N    Patient Care Team: Glendale Chard, MD as PCP - General (Internal Medicine)  Indicate any recent Medical Services you may have received from other than [facility] providers in  the past year (date may be approximate).     Assessment:   This is a routine wellness examination for Cindy Ross.  Hearing/Vision screen Vision Screening - Comments:: Regular eye exams, Dr. Alois Cliche  Dietary issues and exercise activities discussed: Current Exercise Habits: Home exercise routine, Type of exercise: walking, Time (Minutes): 30, Frequency (Times/Week): 7, Weekly Exercise (Minutes/Week): 210   Goals Addressed             This Visit's Progress    Patient Stated       02/28/2022, no goals       Depression Screen    02/28/2022    8:26 AM 02/22/2021    9:36 AM 01/20/2020   12:27 PM 01/20/2020   11:41 AM 12/15/2018    9:00 AM 09/08/2018    8:57 AM 06/02/2018    9:50 AM  PHQ 2/9 Scores  PHQ - 2 Score 0 0 0 0 0 0 0  PHQ- 9 Score   0  0      Fall Risk    02/28/2022    8:24 AM 02/22/2021    9:34 AM 01/20/2020   12:26 PM 01/20/2020   11:44 AM 05/06/2019    8:40 AM  Fall Risk   Falls in the past year? _0 0  Comment slipped on water missed a step     Number falls in past yr: 0  1 0   Injury with Fall? 1 0 0 1   Comment sprained ankle   patient fell when she went to make a step and she doesn't know why.   Risk for fall due to : Medication side effect Medication side effect History of fall(s);Medication side effect    Follow up Falls evaluation completed;Education provided;Falls prevention discussed Falls evaluation completed;Education provided;Falls prevention discussed Falls evaluation completed;Education provided;Falls prevention discussed      FALL RISK PREVENTION PERTAINING TO THE HOME:  Any stairs in or around the home? Yes  If so, are there any without handrails? No  Home free of loose throw rugs in walkways, pet beds, electrical cords, etc? Yes  Adequate lighting in your home to reduce risk of falls? Yes   ASSISTIVE DEVICES UTILIZED TO PREVENT FALLS:  Life alert? No  Use of a cane,  walker or w/c? No  Grab bars in the bathroom? Yes  Shower chair or bench in  shower? No  Elevated toilet seat or a handicapped toilet? No   TIMED UP AND GO:  Was the test performed? No .       Cognitive Function:    06/02/2018    9:55 AM  MMSE - Mini Mental State Exam  Orientation to time 5  Orientation to Place 5  Attention/ Calculation 5  Language- name 2 objects 2  Language- repeat 1  Language- read & follow direction 1  Write a sentence 1  Copy design 1        06/02/2018   10:25 AM  6CIT Screen  What Year? 0 points  What month? 0 points  What time? 0 points  Count back from 20 0 points  Months in reverse 0 points  Repeat phrase 0 points  Total Score 0 points    Immunizations Immunization History  Administered Date(s) Administered   Fluad Quad(high Dose 65+) 04/18/2020, 04/02/2021   Influenza Inj Mdck Quad Pf 07/31/2016, 05/19/2017   Influenza, High Dose Seasonal PF 04/30/2018, 03/30/2019   Influenza-Unspecified 04/17/2018, 03/30/2019   PFIZER Comirnaty(Gray Top)Covid-19 Tri-Sucrose Vaccine 12/14/2020   PFIZER(Purple Top)SARS-COV-2 Vaccination 07/23/2019, 08/13/2019, 03/14/2020   PNEUMOCOCCAL CONJUGATE-20 08/31/2020   Pneumococcal Conjugate-13 09/14/2018, 12/29/2018   Pneumococcal Polysaccharide-23 03/19/2016   Td 10/31/1994, 04/10/1995   Tdap 01/30/2016   Zoster Recombinat (Shingrix) 05/06/2019, 07/12/2019    TDAP status: Up to date  Flu Vaccine status: Due, Education has been provided regarding the importance of this vaccine. Advised may receive this vaccine at local pharmacy or Health Dept. Aware to provide a copy of the vaccination record if obtained from local pharmacy or Health Dept. Verbalized acceptance and understanding.  Pneumococcal vaccine status: Up to date  Covid-19 vaccine status: Completed vaccines  Qualifies for Shingles Vaccine? Yes   Zostavax completed Yes   Shingrix Completed?: Yes  Screening Tests Health Maintenance  Topic Date Due   COVID-19 Vaccine (5 - Pfizer risk series) 02/08/2021   INFLUENZA  VACCINE  01/15/2022   OPHTHALMOLOGY EXAM  04/04/2022   Diabetic kidney evaluation - Urine ACR  04/26/2022   HEMOGLOBIN A1C  07/10/2022   FOOT EXAM  09/05/2022   Diabetic kidney evaluation - GFR measurement  01/08/2023   MAMMOGRAM  10/05/2023   COLONOSCOPY (Pts 45-87yr Insurance coverage will need to be confirmed)  03/19/2025   TETANUS/TDAP  01/29/2026   Pneumonia Vaccine 69 Years old  Completed   DEXA SCAN  Completed   Hepatitis C Screening  Completed   Zoster Vaccines- Shingrix  Completed   HPV VACCINES  Aged Out    Health Maintenance  Health Maintenance Due  Topic Date Due   COVID-19 Vaccine (5 - Pfizer risk series) 02/08/2021   INFLUENZA VACCINE  01/15/2022    Colorectal cancer screening: Type of screening: Colonoscopy. Completed 03/19/2021. Repeat every 4 years  Mammogram status: Completed 10/04/2021. Repeat every year  Bone Density status: Completed 11/02/2019.   Lung Cancer Screening: (Low Dose CT Chest recommended if Age 69-80years, 30 pack-year currently smoking OR have quit w/in 15years.) does not qualify.   Lung Cancer Screening Referral: no  Additional Screening:  Hepatitis C Screening: does qualify; Completed 11/18/2016  Vision Screening: Recommended annual ophthalmology exams for early detection of glaucoma and other disorders of the eye. Is the patient up to date with their annual eye exam?  Yes  Who is the provider or what is the name  of the office in which the patient attends annual eye exams? Dr. Idolina Primer If pt is not established with a provider, would they like to be referred to a provider to establish care? No .   Dental Screening: Recommended annual dental exams for proper oral hygiene  Community Resource Referral / Chronic Care Management: CRR required this visit?  No   CCM required this visit?  No      Plan:     I have personally reviewed and noted the following in the patient's chart:   Medical and social history Use of alcohol, tobacco or  illicit drugs  Current medications and supplements including opioid prescriptions. Patient is not currently taking opioid prescriptions. Functional ability and status Nutritional status Physical activity Advanced directives List of other physicians Hospitalizations, surgeries, and ER visits in previous 12 months Vitals Screenings to include cognitive, depression, and falls Referrals and appointments  In addition, I have reviewed and discussed with patient certain preventive protocols, quality metrics, and best practice recommendations. A written personalized care plan for preventive services as well as general preventive health recommendations were provided to patient.     Kellie Simmering, LPN   [id number]   Nurse Notes: none  Due to this being a virtual visit, the after visit summary with patients personalized plan was offered to patient via mail or my-chart. Patient would like to access on my-chart

## 2022-02-28 NOTE — Progress Notes (Signed)
Rich Brave [place]ittleton,acting as a Education administrator for [name], MD.,have documented all relevant documentation on the behalf of [name], MD,as directed by  [name], MD while in the presence of [name], MD.    Subjective:     Patient ID: Cindy Ross , female    DOB: May 08, 1951 , 71 y.o.   MRN: [id number]   Chief Complaint  Patient presents with   Weight Check    HPI  Patient presents today for a 8 week follow up on her weight. She is accompanied by her sister today. She states she has increased her food intake. She feels well. She denies headaches, chest pain and shortness of breath.      Past Medical History:  Diagnosis Date   Diabetes mellitus without complication ([place])    Hyperlipidemia 01/30/2016   Hypertension 2007   Respiratory crackles at left lung base 05/19/2017   CXR 02/2017 in Care Everywhere/[facility] normal     Family History  Problem Relation Age of Onset   Hyperlipidemia Sister    Diabetes Sister    Hypertension Sister    Heart disease Brother    Healthy Son    Leukemia Brother      Current Outpatient Medications:    aspirin EC 81 MG tablet, Take 1 tablet (81 mg total) by mouth daily., Disp: , Rfl:    Blood Glucose Monitoring Suppl (ACCU-CHEK GUIDE) w/Device KIT, : Use as directed to check blood sugars 2 times per day dx:e11.22, Disp: 1 kit, Rfl: 1   cholecalciferol (VITAMIN D) 1000 units tablet, Take 1,000 Units by mouth daily., Disp: , Rfl:    CLENPIQ 10-3.5-12 MG-GM -GM/160ML SOLN, , Disp: , Rfl:    hydrocortisone (ANUSOL-HC) 2.5 % rectal cream, Place 1 Application rectally 2 (two) times daily. As needed, Disp: 30 g, Rfl: 0   Multiple Vitamins-Minerals (ONE-A-DAY WOMENS 50+ ADVANTAGE PO), Take 1 tablet by mouth daily at 12 noon., Disp: , Rfl:    olmesartan-hydrochlorothiazide (BENICAR HCT) 40-12.5 MG tablet, TAKE 1 TABLET BY MOUTH EVERY DAY, Disp: 90 tablet, Rfl: 1   Accu-Chek FastClix Lancets MISC, : Use as directed to check blood sugars  2 times per day dx:e11.22, Disp: 300 each, Rfl: 2   ACCU-CHEK GUIDE test strip, Use as instructed, Disp: 200 strip, Rfl: 4   atorvastatin (LIPITOR) 20 MG tablet, Take 1 tablet (20 mg total) by mouth daily., Disp: 90 tablet, Rfl: 1   denosumab (PROLIA) 60 MG/ML SOSY injection, Inject 60 mg into the skin every 6 (six) months. Courier to rheum: [address], [place], [place] [address]. Appt on 03/18/22, Disp: 1 mL, Rfl: 0   metFORMIN (GLUCOPHAGE) 1000 MG tablet, Take 1 tablet (1,000 mg total) by mouth 2 (two) times daily., Disp: 180 tablet, Rfl: 2   No Known Allergies   Review of Systems  Constitutional: Negative.   Eyes: Negative.   Respiratory: Negative.    Cardiovascular: Negative.   Gastrointestinal: Negative.   Musculoskeletal: Negative.   Skin: Negative.   Neurological: Negative.   Psychiatric/Behavioral: Negative.       Today's Vitals   02/28/22 1502  BP: 110/68  Pulse: 84  Temp: 98.4 F (36.9 C)  Weight: 124 lb (56.2 kg)  Height: '5\' 4"'  (1.626 m)  PainSc: 0-No pain   Body mass index is 21.28 kg/m.  Wt Readings from Last 3 Encounters:  03/05/22 122 lb 9.6 oz (55.6 kg)  02/28/22 124 lb (56.2 kg)  02/28/22 122 lb (  55.3 kg)     Objective:  Physical Exam Vitals and nursing note reviewed.  Constitutional:      Appearance: Normal appearance.  HENT:     Head: Normocephalic and atraumatic.  Eyes:     Extraocular Movements: Extraocular movements intact.  Cardiovascular:     Rate and Rhythm: Normal rate and regular rhythm.     Heart sounds: Normal heart sounds.  Pulmonary:     Effort: Pulmonary effort is normal.     Breath sounds: Normal breath sounds.  Musculoskeletal:     Cervical back: Normal range of motion.  Skin:    General: Skin is warm.  Neurological:     General: No focal deficit present.     Mental Status: She is alert.  Psychiatric:        Mood and Affect: Mood normal.        Behavior: Behavior normal.      Assessment And Plan:     1.  Decreased appetite Comments: She reports her appetite has improved since her last visit. She does not wish to take meds to address this.   2. Type 2 diabetes mellitus with stage 3a chronic kidney disease, without long-term current use of insulin (HCC) Comments: Chronic, I will refill metformin as requested. Her a1c was 5.8 in July 2023.  - metFORMIN (GLUCOPHAGE) 1000 MG tablet; Take 1 tablet (1,000 mg total) by mouth 2 (two) times daily.  Dispense: 180 tablet; Refill: 2  3. Pure hypercholesterolemia Comments: Chronic, LDL goal <70  She will c/w atorvastatin 49m daily.  - atorvastatin (LIPITOR) 20 MG tablet; Take 1 tablet (20 mg total) by mouth daily.  Dispense: 90 tablet; Refill: 1  4. BRBPR (bright red blood per rectum) Comments: Occasional, she does report h/o hemorrhoids. I will check CBC.  - CBC no Diff  5. External hemorrhoid Comments: I will send rx Anusol cream to apply to affected area bid prn.   6. BMI 21.0-21.9, adult Comments: Her weight is stable. Importance of regular exercise was d/w patient.   7. Immunization due - Flu Vaccine QUAD High Dose(Fluad)   Patient was given opportunity to ask questions. Patient verbalized understanding of the plan and was able to repeat key elements of the plan. All questions were answered to their satisfaction.   I, RMaximino Greenland MD, have reviewed all documentation for this visit. The documentation on 02/28/22 for the exam, diagnosis, procedures, and orders are all accurate and complete.   IF YOU HAVE BEEN REFERRED TO A SPECIALIST, IT MAY TAKE 1-2 WEEKS TO SCHEDULE/PROCESS THE REFERRAL. IF YOU HAVE NOT HEARD FROM US/SPECIALIST IN TWO WEEKS, PLEASE GIVE [place] CALL AT [phone].   THE PATIENT IS ENCOURAGED TO PRACTICE SOCIAL DISTANCING DUE TO THE COVID-19 PANDEMIC.

## 2022-03-05 ENCOUNTER — Encounter: Payer: Self-pay | Admitting: Physician Assistant

## 2022-03-05 ENCOUNTER — Ambulatory Visit: Payer: Medicare Other | Attending: Physician Assistant | Admitting: Physician Assistant

## 2022-03-05 VITALS — BP 114/65 | HR 71 | Resp 14 | Ht 60.4 in | Wt 122.6 lb

## 2022-03-05 DIAGNOSIS — E119 Type 2 diabetes mellitus without complications: Secondary | ICD-10-CM | POA: Diagnosis not present

## 2022-03-05 DIAGNOSIS — I10 Essential (primary) hypertension: Secondary | ICD-10-CM | POA: Diagnosis not present

## 2022-03-05 DIAGNOSIS — M25572 Pain in left ankle and joints of left foot: Secondary | ICD-10-CM | POA: Diagnosis not present

## 2022-03-05 DIAGNOSIS — M19042 Primary osteoarthritis, left hand: Secondary | ICD-10-CM

## 2022-03-05 DIAGNOSIS — M810 Age-related osteoporosis without current pathological fracture: Secondary | ICD-10-CM | POA: Diagnosis not present

## 2022-03-05 DIAGNOSIS — Z789 Other specified health status: Secondary | ICD-10-CM

## 2022-03-05 DIAGNOSIS — Z9181 History of falling: Secondary | ICD-10-CM

## 2022-03-05 DIAGNOSIS — Z5181 Encounter for therapeutic drug level monitoring: Secondary | ICD-10-CM

## 2022-03-05 DIAGNOSIS — M25562 Pain in left knee: Secondary | ICD-10-CM | POA: Diagnosis not present

## 2022-03-05 DIAGNOSIS — M19041 Primary osteoarthritis, right hand: Secondary | ICD-10-CM

## 2022-03-05 DIAGNOSIS — E7800 Pure hypercholesterolemia, unspecified: Secondary | ICD-10-CM

## 2022-03-05 DIAGNOSIS — E559 Vitamin D deficiency, unspecified: Secondary | ICD-10-CM

## 2022-03-05 NOTE — Patient Instructions (Addendum)
Please call to schedule bone density scan: [facility] [phone]   Hand Exercises Hand exercises can be helpful for almost anyone. These exercises can strengthen the hands, improve flexibility and movement, and increase blood flow to the hands. These results can make work and daily tasks easier. Hand exercises can be especially helpful for people who have joint pain from arthritis or have nerve damage from overuse (carpal tunnel syndrome). These exercises can also help people who have injured a hand. Exercises Most of these hand exercises are gentle stretching and motion exercises. It is usually safe to do them often throughout the day. Warming up your hands before exercise may help to reduce stiffness. You can do this with gentle massage or by placing your hands in warm water for 10-15 minutes. It is normal to feel some stretching, pulling, tightness, or mild discomfort as you begin new exercises. This will gradually improve. Stop an exercise right away if you feel sudden, severe pain or your pain gets worse. Ask your health care provider which exercises are best for you. Knuckle bend or "claw" fist  Stand or sit with your arm, hand, and all five fingers pointed straight up. Make sure to keep your wrist straight during the exercise. Gently bend your fingers down toward your palm until the tips of your fingers are touching the top of your palm. Keep your big knuckle straight and just bend the small knuckles in your fingers. Hold this position for __________ seconds. Straighten (extend) your fingers back to the starting position. Repeat this exercise 5-10 times with each hand. Full finger fist  Stand or sit with your arm, hand, and all five fingers pointed straight up. Make sure to keep your wrist straight during the exercise. Gently bend your fingers into your palm until the tips of your fingers are touching the middle of your palm. Hold this position for __________ seconds. Extend  your fingers back to the starting position, stretching every joint fully. Repeat this exercise 5-10 times with each hand. Straight fist Stand or sit with your arm, hand, and all five fingers pointed straight up. Make sure to keep your wrist straight during the exercise. Gently bend your fingers at the big knuckle, where your fingers meet your hand, and the middle knuckle. Keep the knuckle at the tips of your fingers straight and try to touch the bottom of your palm. Hold this position for __________ seconds. Extend your fingers back to the starting position, stretching every joint fully. Repeat this exercise 5-10 times with each hand. Tabletop  Stand or sit with your arm, hand, and all five fingers pointed straight up. Make sure to keep your wrist straight during the exercise. Gently bend your fingers at the big knuckle, where your fingers meet your hand, as far down as you can while keeping the small knuckles in your fingers straight. Think of forming a tabletop with your fingers. Hold this position for __________ seconds. Extend your fingers back to the starting position, stretching every joint fully. Repeat this exercise 5-10 times with each hand. Finger spread  Place your hand flat on a table with your palm facing down. Make sure your wrist stays straight as you do this exercise. Spread your fingers and thumb apart from each other as far as you can until you feel a gentle stretch. Hold this position for __________ seconds. Bring your fingers and thumb tight together again. Hold this position for __________ seconds. Repeat this exercise 5-10 times with each hand. Making circles  Stand or sit with your arm, hand, and all five fingers pointed straight up. Make sure to keep your wrist straight during the exercise. Make a circle by touching the tip of your thumb to the tip of your index finger. Hold for __________ seconds. Then open your hand wide. Repeat this motion with your thumb and each  finger on your hand. Repeat this exercise 5-10 times with each hand. Thumb motion  Sit with your forearm resting on a table and your wrist straight. Your thumb should be facing up toward the ceiling. Keep your fingers relaxed as you move your thumb. Lift your thumb up as high as you can toward the ceiling. Hold for __________ seconds. Bend your thumb across your palm as far as you can, reaching the tip of your thumb for the small finger (pinkie) side of your palm. Hold for __________ seconds. Repeat this exercise 5-10 times with each hand. Grip strengthening  Hold a stress ball or other soft ball in the middle of your hand. Slowly increase the pressure, squeezing the ball as much as you can without causing pain. Think of bringing the tips of your fingers into the middle of your palm. All of your finger joints should bend when doing this exercise. Hold your squeeze for __________ seconds, then relax. Repeat this exercise 5-10 times with each hand. Contact a health care provider if: Your hand pain or discomfort gets much worse when you do an exercise. Your hand pain or discomfort does not improve within 2 hours after you exercise. If you have any of these problems, stop doing these exercises right away. Do not do them again unless your health care provider says that you can. Get help right away if: You develop sudden, severe hand pain or swelling. If this happens, stop doing these exercises right away. Do not do them again unless your health care provider says that you can. This information is not intended to replace advice given to you by your health care provider. Make sure you discuss any questions you have with your health care provider. Document Revised: 09/21/2020 Document Reviewed: 09/21/2020 Elsevier Patient Education  [place].

## 2022-03-06 ENCOUNTER — Other Ambulatory Visit (HOSPITAL_COMMUNITY): Payer: Self-pay

## 2022-03-06 LAB — COMPLETE METABOLIC PANEL WITH GFR
AG Ratio: 1.4 (calc) (ref 1.0–2.5)
ALT: 14 U/L (ref 6–29)
AST: 20 U/L (ref 10–35)
Albumin: 4.8 g/dL (ref 3.6–5.1)
Alkaline phosphatase (APISO): 39 U/L (ref 37–153)
BUN/Creatinine Ratio: 30 (calc) — ABNORMAL HIGH (ref 6–22)
BUN: 40 mg/dL — ABNORMAL HIGH (ref 7–25)
CO2: 22 mmol/L (ref 20–32)
Calcium: 10.4 mg/dL (ref 8.6–10.4)
Chloride: 109 mmol/L (ref 98–110)
Creat: 1.33 mg/dL — ABNORMAL HIGH (ref 0.50–1.05)
Globulin: 3.4 g/dL (calc) (ref 1.9–3.7)
Glucose, Bld: 87 mg/dL (ref 65–99)
Potassium: 5.1 mmol/L (ref 3.5–5.3)
Sodium: 140 mmol/L (ref 135–146)
Total Bilirubin: 0.4 mg/dL (ref 0.2–1.2)
Total Protein: 8.2 g/dL — ABNORMAL HIGH (ref 6.1–8.1)
eGFR: 43 mL/min/{1.73_m2} — ABNORMAL LOW (ref >=60)

## 2022-03-06 LAB — VITAMIN D 25 HYDROXY (VIT D DEFICIENCY, FRACTURES): Vit D, 25-Hydroxy: 52 ng/mL (ref 30–100)

## 2022-03-06 MED ORDER — DENOSUMAB 60 MG/ML ~~LOC~~ SOSY
60.0000 mg | PREFILLED_SYRINGE | SUBCUTANEOUS | 0 refills | Status: DC
  Filled 2022-03-06 (×2): qty 1, 180d supply, fill #0

## 2022-03-06 NOTE — Telephone Encounter (Signed)
CBC, CMP, Vitamin D wnl on 03/05/22.  Patient can receive Prolia on 03/18/22. Rx sent to [facility] to be couriered to clinic before appt on 03/18/22  Knox Saliva, PharmD, MPH, BCPS, CPP Clinical Pharmacist (Rheumatology and Pulmonology)

## 2022-03-06 NOTE — Progress Notes (Signed)
Vitamin D WNL.  Creatinine is elevated1.33 and GFR is low-43.  Patient should avoid the use of NSAIDs.   Calcium WNL. Total protein is borderline elevated.   Ok to proceed with prolia once eligible.

## 2022-03-08 ENCOUNTER — Other Ambulatory Visit (HOSPITAL_COMMUNITY): Payer: Self-pay

## 2022-03-08 DIAGNOSIS — H5213 Myopia, bilateral: Secondary | ICD-10-CM | POA: Diagnosis not present

## 2022-03-11 DIAGNOSIS — M810 Age-related osteoporosis without current pathological fracture: Secondary | ICD-10-CM | POA: Diagnosis not present

## 2022-03-11 DIAGNOSIS — M8588 Other specified disorders of bone density and structure, other site: Secondary | ICD-10-CM | POA: Diagnosis not present

## 2022-03-11 DIAGNOSIS — Z780 Asymptomatic menopausal state: Secondary | ICD-10-CM | POA: Diagnosis not present

## 2022-03-11 LAB — HM DEXA SCAN

## 2022-03-11 NOTE — Telephone Encounter (Signed)
Prolia received from [facility]. Placed in refrigerator  Virgil Lightner, PharmD, MPH, BCPS, CPP Clinical Pharmacist (Rheumatology and Pulmonology) 

## 2022-03-12 NOTE — Progress Notes (Signed)
Pharmacy Note  Subjective:   Patient presents to clinic today to receive bi-annual dose of Prolia. Patient's last dose of Prolia was on 09/18/21.  Patient running a fever or have signs/symptoms of infection? No  Patient currently on antibiotics for the treatment of infection? No  Patient had fall in the last 6 months?  No. She had fibular fracture in April 2023 after slipping at home on a wet floor. Reports no falls since then.  Patient taking calcium 1200 mg daily through diet or supplement and at least 800 units vitamin D? Yes  Objective: CMP     Component Value Date/Time   NA 140 03/05/2022 0950   NA 140 01/07/2022 0930   K 5.1 03/05/2022 0950   CL 109 03/05/2022 0950   CO2 22 03/05/2022 0950   GLUCOSE 87 03/05/2022 0950   BUN 40 (H) 03/05/2022 0950   BUN 24 01/07/2022 0930   CREATININE 1.33 (H) 03/05/2022 0950   CALCIUM 10.4 03/05/2022 0950   PROT 8.2 (H) 03/05/2022 0950   PROT 7.9 01/07/2022 0930   ALBUMIN 4.6 01/07/2022 0930   AST 20 03/05/2022 0950   ALT 14 03/05/2022 0950   ALKPHOS 53 01/07/2022 0930   BILITOT 0.4 03/05/2022 0950   BILITOT 0.3 01/07/2022 0930   GFRNONAA 56 (L) 09/12/2020 1336   GFRAA 65 09/12/2020 1336    CBC    Component Value Date/Time   WBC 5.5 02/28/2022 1604   WBC 4.8 03/08/2021 0914   RBC 3.25 (L) 02/28/2022 1604   RBC 3.75 (L) 03/08/2021 0914   HGB 10.0 (L) 02/28/2022 1604   HCT 30.9 (L) 02/28/2022 1604   PLT 263 02/28/2022 1604   MCV 95 02/28/2022 1604   MCH 30.8 02/28/2022 1604   MCH 29.9 03/08/2021 0914   MCHC 32.4 02/28/2022 1604   MCHC 32.1 03/08/2021 0914   RDW 12.5 02/28/2022 1604   LYMPHSABS 2,266 03/08/2021 0914   LYMPHSABS 3.9 (H) 11/18/2016 0936   EOSABS 240 03/08/2021 0914   EOSABS 0.6 (H) 11/18/2016 0936   BASOSABS 29 03/08/2021 0914   BASOSABS 0.0 11/18/2016 0936    Lab Results  Component Value Date   VD25OH 52 03/05/2022    T-score: DEXA 11/02/19- BMD measured at Femur Neck Right is 0.573 g/cm2 with a  T-score of -3.3  Assessment/Plan:   Reviewed importance of adequate dietary intake of calcium in addition to supplementation due to risk of hypocalcemia with Prolia.   Patient tolerated injection without issue.   Administrations This Visit     denosumab (PROLIA) injection 60 mg     Admin Date 03/18/2022 Action Given Dose 60 mg Route Subcutaneous Administered By Cassandria Anger, RPH-CPP           Patient's next Prolia dose is due on 09/14/2022.  Patient was due for updated DEXA in May 2023. Order already in place. Patient was provided with phone number for breast center to schedule updated bone density.   All questions encouraged and answered.  Instructed patient to call with any further questions or concerns.  Knox Saliva, PharmD, MPH, BCPS, CPP Clinical Pharmacist (Rheumatology and Pulmonology)

## 2022-03-14 MED ORDER — HYDROCORTISONE (PERIANAL) 2.5 % EX CREA: 1.0000 | TOPICAL_CREAM | Freq: Two times a day (BID) | CUTANEOUS | 0 refills | Status: AC

## 2022-03-18 ENCOUNTER — Ambulatory Visit: Payer: Medicare Other | Attending: Rheumatology | Admitting: Pharmacist

## 2022-03-18 ENCOUNTER — Other Ambulatory Visit (HOSPITAL_COMMUNITY): Payer: Self-pay

## 2022-03-18 DIAGNOSIS — M810 Age-related osteoporosis without current pathological fracture: Secondary | ICD-10-CM | POA: Diagnosis not present

## 2022-03-18 MED ORDER — DENOSUMAB 60 MG/ML ~~LOC~~ SOSY
60.0000 mg | PREFILLED_SYRINGE | Freq: Once | SUBCUTANEOUS | Status: AC
  Administered 2022-03-18: 60 mg via SUBCUTANEOUS

## 2022-03-18 NOTE — Patient Instructions (Incomplete)
You are overdue for updated bone density for osteoporosis monitoring. Please call to schedule bone density scan: [facility] [phone]

## 2022-04-01 ENCOUNTER — Encounter: Payer: Self-pay | Admitting: Internal Medicine

## 2022-04-01 DIAGNOSIS — H524 Presbyopia: Secondary | ICD-10-CM | POA: Diagnosis not present

## 2022-05-21 ENCOUNTER — Ambulatory Visit (INDEPENDENT_AMBULATORY_CARE_PROVIDER_SITE_OTHER): Payer: Medicare Other | Admitting: Internal Medicine

## 2022-05-21 ENCOUNTER — Encounter: Payer: Self-pay | Admitting: Internal Medicine

## 2022-05-21 VITALS — BP 118/68 | HR 79 | Temp 97.9°F | Ht 60.0 in | Wt 122.8 lb

## 2022-05-21 DIAGNOSIS — E7800 Pure hypercholesterolemia, unspecified: Secondary | ICD-10-CM | POA: Diagnosis not present

## 2022-05-21 DIAGNOSIS — E1122 Type 2 diabetes mellitus with diabetic chronic kidney disease: Secondary | ICD-10-CM | POA: Diagnosis not present

## 2022-05-21 DIAGNOSIS — I129 Hypertensive chronic kidney disease with stage 1 through stage 4 chronic kidney disease, or unspecified chronic kidney disease: Secondary | ICD-10-CM

## 2022-05-21 DIAGNOSIS — N1831 Chronic kidney disease, stage 3a: Secondary | ICD-10-CM | POA: Diagnosis not present

## 2022-05-21 DIAGNOSIS — Z0000 Encounter for general adult medical examination without abnormal findings: Secondary | ICD-10-CM | POA: Diagnosis not present

## 2022-05-21 DIAGNOSIS — K644 Residual hemorrhoidal skin tags: Secondary | ICD-10-CM

## 2022-05-21 LAB — POCT URINALYSIS DIPSTICK
Bilirubin, UA: NEGATIVE
Glucose, UA: NEGATIVE
Ketones, UA: NEGATIVE
Leukocytes, UA: NEGATIVE
Nitrite, UA: NEGATIVE
Protein, UA: NEGATIVE
Spec Grav, UA: 1.01 (ref 1.010–1.025)
Urobilinogen, UA: 0.2 E.U./dL
pH, UA: 5.5 (ref 5.0–8.0)

## 2022-05-21 LAB — HEMOCCULT GUIAC POC 1CARD (OFFICE)
Card #2 Fecal Occult Blod, POC: NEGATIVE
Card #3 Fecal Occult Blood, POC: NEGATIVE
Fecal Occult Blood, POC: NEGATIVE

## 2022-05-21 NOTE — Patient Instructions (Signed)

## 2022-05-21 NOTE — Progress Notes (Unsigned)
Cindy Ross,acting as a Education administrator for Cindy Greenland, MD.,have documented all relevant documentation on the behalf of Cindy Greenland, MD,as directed by  Cindy Greenland, MD while in the presence of Cindy Greenland, MD.   Subjective:     Patient ID: Cindy Ross , female    DOB: [birth year] , 71 y.o.   MRN: [id number]   Chief Complaint  Patient presents with   Annual Exam   Diabetes   Hypertension    HPI  Patient presents today for HM. She is no longer followed by GYN.  She reports compliance with medications and has no other concerns at this time.  She denies having any headaches, chest pain and shortness of breath.   BP Readings from Last 3 Encounters: 05/21/22 : 118/68 03/05/22 : 114/65 02/28/22 : 110/68    Diabetes She presents for her follow-up diabetic visit. She has type 2 diabetes mellitus. There are no hypoglycemic associated symptoms. Pertinent negatives for diabetes include no blurred vision, no polydipsia, no polyphagia and no polyuria. There are no hypoglycemic complications. She is compliant with treatment most of the time. She is following a diabetic diet. When asked about meal planning, she reported none. She participates in exercise intermittently. Her home blood glucose trend is fluctuating minimally. An ACE inhibitor/angiotensin II receptor blocker is being taken. Eye exam is not current.  Hypertension This is a chronic problem. The current episode started more than 1 year ago. The problem has been gradually improving since onset. The problem is controlled. Pertinent negatives include no blurred vision or palpitations. Risk factors for coronary artery disease include diabetes mellitus, dyslipidemia and post-menopausal state. The current treatment provides moderate improvement.     Past Medical History:  Diagnosis Date   Diabetes mellitus without complication ([place])    Hyperlipidemia 01/30/2016   Hypertension 2007   Respiratory crackles at left lung base 05/19/2017    CXR 02/2017 in Care Everywhere/[facility] normal     Family History  Problem Relation Age of Onset   Hyperlipidemia Sister    Diabetes Sister    Hypertension Sister    Heart disease Brother    Healthy Son    Leukemia Brother      Current Outpatient Medications:    Accu-Chek Occupational psychologist MISC, : Use as directed to check blood sugars 2 times per day dx:e11.22, Disp: 300 each, Rfl: 2   ACCU-CHEK GUIDE test strip, Use as instructed, Disp: 200 strip, Rfl: 4   aspirin EC 81 MG tablet, Take 1 tablet (81 mg total) by mouth daily., Disp: , Rfl:    atorvastatin (LIPITOR) 20 MG tablet, Take 1 tablet (20 mg total) by mouth daily., Disp: 90 tablet, Rfl: 1   Blood Glucose Monitoring Suppl (ACCU-CHEK GUIDE) w/Device KIT, : Use as directed to check blood sugars 2 times per day dx:e11.22, Disp: 1 kit, Rfl: 1   cholecalciferol (VITAMIN D) 1000 units tablet, Take 1,000 Units by mouth daily., Disp: , Rfl:    denosumab (PROLIA) 60 MG/ML SOSY injection, Inject 60 mg into the skin every 6 (six) months. Courier to rheum: [address] [place], [place] [address]. Appt on 03/18/22, Disp: 1 mL, Rfl: 0   hydrocortisone (ANUSOL-HC) 2.5 % rectal cream, Place 1 Application rectally 2 (two) times daily. As needed, Disp: 30 g, Rfl: 0   metFORMIN (GLUCOPHAGE) 1000 MG tablet, Take 1 tablet (1,000 mg total) by mouth 2 (two) times daily., Disp: 180 tablet, Rfl: 2   Multiple Vitamins-Minerals (ONE-A-DAY  WOMENS 50+ ADVANTAGE PO), Take 1 tablet by mouth daily at 12 noon., Disp: , Rfl:    olmesartan-hydrochlorothiazide (BENICAR HCT) 40-12.5 MG tablet, TAKE 1 TABLET BY MOUTH EVERY DAY, Disp: 90 tablet, Rfl: 1   CLENPIQ 10-3.5-12 MG-GM -GM/160ML SOLN, , Disp: , Rfl:    No Known Allergies    The patient states she uses post menopausal status for birth control. Last LMP was No LMP recorded. Patient is postmenopausal.. Negative for Dysmenorrhea. Negative for: breast discharge, breast lump(s), breast pain and breast self  exam. Associated symptoms include abnormal vaginal bleeding. Pertinent negatives include abnormal bleeding (hematology), anxiety, decreased libido, depression, difficulty falling sleep, dyspareunia, history of infertility, nocturia, sexual dysfunction, sleep disturbances, urinary incontinence, urinary urgency, vaginal discharge and vaginal itching. Diet regular.    . The patient's tobacco use is:  Social History   Tobacco Use  Smoking Status Never   Passive exposure: Never  Smokeless Tobacco Never  . She has been exposed to passive smoke. The patient's alcohol use is:  Social History   Substance and Sexual Activity  Alcohol Use No   Alcohol/week: 0.0 standard drinks of alcohol    Review of Systems  Constitutional: Negative.   HENT: Negative.    Eyes: Negative.  Negative for blurred vision.  Respiratory: Negative.    Cardiovascular: Negative.  Negative for palpitations.  Gastrointestinal: Negative.   Endocrine: Negative.  Negative for polydipsia, polyphagia and polyuria.  Genitourinary: Negative.   Musculoskeletal: Negative.   Skin: Negative.   Allergic/Immunologic: Negative.   Neurological: Negative.   Psychiatric/Behavioral: Negative.       Today's Vitals   05/21/22 0911  BP: 118/68  Pulse: 79  Temp: 97.9 F (36.6 C)  TempSrc: Oral  Weight: 122 lb 12.8 oz (55.7 kg)  Height: 5' (1.524 m)  PainSc: 0-No pain   Body mass index is 23.98 kg/m.  Wt Readings from Last 3 Encounters:  05/21/22 122 lb 12.8 oz (55.7 kg)  03/05/22 122 lb 9.6 oz (55.6 kg)  02/28/22 124 lb (56.2 kg)    Objective:  Physical Exam Vitals and nursing note reviewed.  Constitutional:      Appearance: Normal appearance.  HENT:     Head: Normocephalic and atraumatic.     Right Ear: Tympanic membrane, ear canal and external ear normal.     Left Ear: Tympanic membrane, ear canal and external ear normal.     Nose:     Comments: Masked     Mouth/Throat:     Comments: Masked Eyes:      Extraocular Movements: Extraocular movements intact.     Conjunctiva/sclera: Conjunctivae normal.     Pupils: Pupils are equal, round, and reactive to light.  Cardiovascular:     Rate and Rhythm: Normal rate and regular rhythm.     Pulses: Normal pulses.          Dorsalis pedis pulses are 2+ on the right side and 2+ on the left side.     Heart sounds: Normal heart sounds.  Pulmonary:     Effort: Pulmonary effort is normal.     Breath sounds: Normal breath sounds.  Chest:  Breasts:    Tanner Score is 5.     Right: Normal.     Left: Normal.  Abdominal:     General: Abdomen is flat. Bowel sounds are normal.     Palpations: Abdomen is soft.  Genitourinary:    Comments: deferred Musculoskeletal:        General: Normal range of  motion.     Cervical back: Normal range of motion and neck supple.  Feet:     Right foot:     Protective Sensation: 5 sites tested.  5 sites sensed.     Skin integrity: Dry skin present.     Toenail Condition: Right toenails are normal.     Left foot:     Protective Sensation: 5 sites tested.  5 sites sensed.     Skin integrity: Dry skin present.     Toenail Condition: Left toenails are normal.  Skin:    General: Skin is warm and dry.  Neurological:     General: No focal deficit present.     Mental Status: She is alert and oriented to person, place, and time.  Psychiatric:        Mood and Affect: Mood normal.        Behavior: Behavior normal.      Assessment And Plan:     1. Annual physical exam Comments: A full exam was performed.  DRE performed, stool is heme negative.  Importance of monthly self breast exams was discussed with the patient.  PATIENT IS ADVISED TO GET 30-45 MINUTES REGULAR EXERCISE NO LESS THAN FOUR TO FIVE DAYS PER WEEK - BOTH WEIGHTBEARING EXERCISES AND AEROBIC ARE RECOMMENDED.  PATIENT IS ADVISED TO FOLLOW A HEALTHY DIET WITH AT LEAST SIX FRUITS/VEGGIES PER DAY, DECREASE INTAKE OF RED MEAT, AND TO INCREASE FISH INTAKE TO TWO DAYS  PER WEEK.  MEATS/FISH SHOULD NOT BE FRIED, BAKED OR BROILED IS PREFERABLE.  IT IS ALSO IMPORTANT TO CUT BACK ON YOUR SUGAR INTAKE. PLEASE AVOID ANYTHING WITH ADDED SUGAR, CORN SYRUP OR OTHER SWEETENERS. IF YOU MUST USE A SWEETENER, YOU CAN TRY STEVIA. IT IS ALSO IMPORTANT TO AVOID ARTIFICIALLY SWEETENERS AND DIET BEVERAGES. LASTLY, I SUGGEST WEARING SPF 50 SUNSCREEN ON EXPOSED PARTS AND ESPECIALLY WHEN IN THE DIRECT SUNLIGHT FOR AN EXTENDED PERIOD OF TIME.  PLEASE AVOID FAST FOOD RESTAURANTS AND INCREASE YOUR WATER INTAKE. - POCT occult blood stool  2. 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 I reviewed CKD stages, she agrees to Nephrology referral. I will check labs as below. She will f/u 4 months.  I DISCUSSED WITH THE PATIENT AT LENGTH REGARDING THE GOALS OF GLYCEMIC CONTROL AND POSSIBLE LONG-TERM COMPLICATIONS.  I  ALSO STRESSED THE IMPORTANCE OF COMPLIANCE WITH HOME GLUCOSE MONITORING, DIETARY RESTRICTIONS INCLUDING AVOIDANCE OF SUGARY DRINKS/PROCESSED FOODS,  ALONG WITH REGULAR EXERCISE.  I  ALSO STRESSED THE IMPORTANCE OF ANNUAL EYE EXAMS, SELF FOOT CARE AND COMPLIANCE WITH OFFICE VISITS.  - Microalbumin / creatinine urine ratio - POCT urinalysis dipstick - CMP14+EGFR - CBC - Hemoglobin A1c - Lipid panel - Ambulatory referral to Nephrology - Protein electrophoresis, serum - Phosphorus - Parathyroid Hormone, Intact w/Ca  3. Parenchymal renal hypertension, stage 1 through stage 4 or unspecified chronic kidney disease Comments: Chronic, well controlled. EKG performed, NSR w/o acute changes.  She will c/w olmesartan/hct 40/12.49m daily. She will f/u in 4-6 months. - EKG 12-Lead - CMP14+EGFR - Lipid panel  4. Pure hypercholesterolemia Comments: Chronic, LDL goal <70. She will c/w atorvastatin 25mdaily. - Lipid panel   Patient was given opportunity to ask questions.  Patient verbalized understanding of the plan and was able to repeat key elements of the plan. All questions were answered to their satisfaction.   I, RoMaximino GreenlandMD, have reviewed  all documentation for this visit. The documentation on 05/21/22 for the exam, diagnosis, procedures, and orders are all accurate and complete.   THE PATIENT IS ENCOURAGED TO PRACTICE SOCIAL DISTANCING DUE TO THE COVID-19 PANDEMIC.

## 2022-05-23 LAB — MICROALBUMIN / CREATININE URINE RATIO
Creatinine, Urine: 63.2 mg/dL
Microalb/Creat Ratio: 29 mg/g creat (ref 0–29)
Microalbumin, Urine: 18.4 ug/mL

## 2022-05-23 LAB — PROTEIN ELECTROPHORESIS, SERUM
A/G Ratio: 1.1 (ref 0.7–1.7)
Albumin ELP: 4.2 g/dL (ref 2.9–4.4)
Alpha 1: 0.2 g/dL (ref 0.0–0.4)
Alpha 2: 0.9 g/dL (ref 0.4–1.0)
Beta: 0.9 g/dL (ref 0.7–1.3)
Gamma Globulin: 1.7 g/dL (ref 0.4–1.8)
Globulin, Total: 3.7 g/dL (ref 2.2–3.9)

## 2022-05-23 LAB — CBC
Hematocrit: 31.8 % — ABNORMAL LOW (ref 34.0–46.6)
Hemoglobin: 10.3 g/dL — ABNORMAL LOW (ref 11.1–15.9)
MCH: 30.6 pg (ref 26.6–33.0)
MCHC: 32.4 g/dL (ref 31.5–35.7)
MCV: 94 fL (ref 79–97)
Platelets: 236 10*3/uL (ref 150–450)
RBC: 3.37 x10E6/uL — ABNORMAL LOW (ref 3.77–5.28)
RDW: 12 % (ref 11.7–15.4)
WBC: 3.7 10*3/uL (ref 3.4–10.8)

## 2022-05-23 LAB — CMP14+EGFR
ALT: 15 IU/L (ref 0–32)
AST: 22 IU/L (ref 0–40)
Albumin/Globulin Ratio: 1.5 (ref 1.2–2.2)
Albumin: 4.8 g/dL (ref 3.9–4.9)
Alkaline Phosphatase: 54 IU/L (ref 44–121)
BUN/Creatinine Ratio: 25 (ref 12–28)
BUN: 31 mg/dL — ABNORMAL HIGH (ref 8–27)
Bilirubin Total: 0.3 mg/dL (ref 0.0–1.2)
CO2: 19 mmol/L — ABNORMAL LOW (ref 20–29)
Calcium: 9.3 mg/dL (ref 8.7–10.3)
Chloride: 106 mmol/L (ref 96–106)
Creatinine, Ser: 1.22 mg/dL — ABNORMAL HIGH (ref 0.57–1.00)
Globulin, Total: 3.1 g/dL (ref 1.5–4.5)
Glucose: 103 mg/dL — ABNORMAL HIGH (ref 70–99)
Potassium: 5.4 mmol/L — ABNORMAL HIGH (ref 3.5–5.2)
Sodium: 139 mmol/L (ref 134–144)
Total Protein: 7.9 g/dL (ref 6.0–8.5)
eGFR: 48 mL/min/{1.73_m2} — ABNORMAL LOW (ref >59)

## 2022-05-23 LAB — LIPID PANEL
Chol/HDL Ratio: 4.1 ratio (ref 0.0–4.4)
Cholesterol, Total: 143 mg/dL (ref 100–199)
HDL: 35 mg/dL — ABNORMAL LOW (ref >39)
LDL Chol Calc (NIH): 78 mg/dL (ref 0–99)
Triglycerides: 172 mg/dL — ABNORMAL HIGH (ref 0–149)
VLDL Cholesterol Cal: 30 mg/dL (ref 5–40)

## 2022-05-23 LAB — HEMOGLOBIN A1C
Est. average glucose Bld gHb Est-mCnc: 126 mg/dL
Hgb A1c MFr Bld: 6 % — ABNORMAL HIGH (ref 4.8–5.6)

## 2022-05-23 LAB — PTH, INTACT AND CALCIUM: PTH: 55 pg/mL (ref 15–65)

## 2022-05-23 LAB — PHOSPHORUS: Phosphorus: 3.3 mg/dL (ref 3.0–4.3)

## 2022-05-27 ENCOUNTER — Encounter: Payer: Self-pay | Admitting: Internal Medicine

## 2022-05-29 ENCOUNTER — Encounter: Payer: Self-pay | Admitting: Internal Medicine

## 2022-06-05 ENCOUNTER — Other Ambulatory Visit (HOSPITAL_COMMUNITY): Payer: Self-pay

## 2022-07-12 ENCOUNTER — Other Ambulatory Visit (HOSPITAL_COMMUNITY): Payer: Self-pay

## 2022-07-15 DIAGNOSIS — E113293 Type 2 diabetes mellitus with mild nonproliferative diabetic retinopathy without macular edema, bilateral: Secondary | ICD-10-CM | POA: Diagnosis not present

## 2022-08-22 ENCOUNTER — Other Ambulatory Visit: Payer: Self-pay | Admitting: Rheumatology

## 2022-08-22 ENCOUNTER — Other Ambulatory Visit (HOSPITAL_COMMUNITY): Payer: Self-pay

## 2022-08-22 DIAGNOSIS — M810 Age-related osteoporosis without current pathological fracture: Secondary | ICD-10-CM

## 2022-08-22 DIAGNOSIS — Z79899 Other long term (current) drug therapy: Secondary | ICD-10-CM

## 2022-08-29 ENCOUNTER — Other Ambulatory Visit: Payer: Self-pay

## 2022-08-29 ENCOUNTER — Other Ambulatory Visit (HOSPITAL_COMMUNITY): Payer: Self-pay

## 2022-08-29 ENCOUNTER — Telehealth: Payer: Self-pay | Admitting: Pharmacist

## 2022-08-29 DIAGNOSIS — Z79899 Other long term (current) drug therapy: Secondary | ICD-10-CM

## 2022-08-29 DIAGNOSIS — M810 Age-related osteoporosis without current pathological fracture: Secondary | ICD-10-CM

## 2022-08-29 DIAGNOSIS — Z5181 Encounter for therapeutic drug level monitoring: Secondary | ICD-10-CM

## 2022-08-29 MED ORDER — DENOSUMAB 60 MG/ML ~~LOC~~ SOSY
60.0000 mg | PREFILLED_SYRINGE | SUBCUTANEOUS | 0 refills | Status: DC
  Filled 2022-08-29: qty 1, 180d supply, fill #0

## 2022-08-29 NOTE — Telephone Encounter (Signed)
Patient due for Prolia on 09/14/2022. Per test claim through pharmacy benefit, copay for Prolia is $4.60. She can have CBC, CMP, Vitamin D updated at [facility] on 09/12/22.  Rx sent to [facility] today for it to be couriered to clinic by appt. But will need updated labs prior to administering Prolia  Knox Saliva, PharmD, MPH, BCPS, CPP Clinical Pharmacist (Rheumatology and Pulmonology)

## 2022-09-03 NOTE — Progress Notes (Signed)
Office Visit Note  Patient: Cindy Ross             Date of Birth: 01-02-1951           MRN: [id number]             PCP: Glendale Chard, MD Referring: Glendale Chard, MD Visit Date: 09/12/2022 Occupation: @GUAROCC @  Interpreter: Cindy Ross  Subjective:  Medication management  History of Present Illness: Cindy Ross is a 72 y.o. female with history of osteoporosis and osteoarthritis.  She states she has not had any falls since the last visit.  She had her last Prolia injection in October 2023 which she tolerated well without any side effects.  She continues to take calcium and vitamin D on a regular basis.  Walks for exercise.  She states since she had fallen she has intermittent discomfort in her right knee joint.  Her right ankle joint discomfort has completely resolved.  History of some discomfort in her left hand which she describes in the left index and middle finger DIP joints.    Activities of Daily Living:  Patient reports morning stiffness for 0 minute.   Patient Denies nocturnal pain.  Difficulty dressing/grooming: Denies Difficulty climbing stairs: Denies Difficulty getting out of chair: Denies Difficulty using hands for taps, buttons, cutlery, and/or writing: Denies  Review of Systems  Constitutional:  Negative for fatigue.  HENT:  Negative for mouth dryness.   Eyes:  Negative for dryness.  Respiratory:  Negative for shortness of breath.   Cardiovascular:  Negative for chest pain and palpitations.  Gastrointestinal:  Negative for constipation and diarrhea.  Endocrine: Negative for increased urination.  Genitourinary:  Negative for difficulty urinating.  Musculoskeletal:  Positive for joint pain and joint pain. Negative for myalgias, morning stiffness and myalgias.  Skin:  Negative for color change, rash and sensitivity to sunlight.  Allergic/Immunologic: Negative for susceptible to infections.  Hematological:  Negative for swollen glands.  Psychiatric/Behavioral:   Negative for depressed mood. The patient is not nervous/anxious.     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   BRBPR (bright red blood per rectum) 10/03/2020   Normocytic anemia 10/03/2020   Age-related osteoporosis without current pathological fracture 03/30/2020   Language barrier 03/30/2020   Respiratory crackles at left lung base 05/19/2017   Essential hypertension 01/30/2016   DM type 2 (diabetes mellitus, type 2) ([place]) 01/30/2016   Hyperlipidemia 01/30/2016    Past Medical History:  Diagnosis Date   Diabetes mellitus without complication ([place])    Hyperlipidemia 01/30/2016   Hypertension 2007   Respiratory crackles at left lung base 05/19/2017   CXR 02/2017 in Care Every[facility] normal    Family History  Problem Relation Age of Onset   Hyperlipidemia Sister    Diabetes Sister    Hypertension Sister    Heart disease Brother    Healthy Son    Leukemia Brother    History reviewed. No pertinent surgical history. Social History   Social History Narrative   Originally from [place]   Came to [place] In 54   Lives with younger sister, Cindy Ross   Son lives in [place].  Works in [employer].     Immunization History  Administered Date(s) Administered   Fluad Quad(high Dose 65+) 04/18/2020, 04/02/2021, 02/28/2022   Influenza Inj Mdck Quad Pf 07/31/2016, 05/19/2017   Influenza, High Dose Seasonal PF 04/30/2018, 03/30/2019   Influenza-Unspecified 04/17/2018, 03/30/2019  PFIZER Comirnaty(Gray Top)Covid-19 Tri-Sucrose Vaccine 12/14/2020   PFIZER(Purple Top)SARS-COV-2 Vaccination 07/23/2019, 08/13/2019, 03/14/2020   PNEUMOCOCCAL CONJUGATE-20 08/31/2020   Pneumococcal Conjugate-13 09/14/2018, 12/29/2018   Pneumococcal Polysaccharide-23 03/19/2016   Td 10/31/1994, 04/10/1995   Tdap 01/30/2016   Zoster Recombinat (Shingrix) 05/06/2019, 07/12/2019     Objective: Vital Signs: BP 121/65 (BP  Location: Right Arm, Patient Position: Sitting, Cuff Size: Normal)   Pulse 69   Resp 16   Ht 5\' 1"  (1.549 m)   Wt 124 lb 12.8 oz (56.6 kg)   BMI 23.58 kg/m    Physical Exam Vitals and nursing note reviewed.  Constitutional:      Appearance: She is well-developed.  HENT:     Head: Normocephalic and atraumatic.  Eyes:     Conjunctiva/sclera: Conjunctivae normal.  Cardiovascular:     Rate and Rhythm: Normal rate and regular rhythm.     Heart sounds: Normal heart sounds.  Pulmonary:     Effort: Pulmonary effort is normal.     Breath sounds: Normal breath sounds.  Abdominal:     General: Bowel sounds are normal.     Palpations: Abdomen is soft.  Musculoskeletal:     Cervical back: Normal range of motion.  Lymphadenopathy:     Cervical: No cervical adenopathy.  Skin:    General: Skin is warm and dry.     Capillary Refill: Capillary refill takes less than 2 seconds.  Neurological:     Mental Status: She is alert and oriented to person, place, and time.  Psychiatric:        Behavior: Behavior normal.      Musculoskeletal Exam: Cervical spine was in good range of motion with no discomfort.  There was no discomfort over thoracic or lumbar spine or SI joints.  Shoulder joints and elbow joints in good range of motion.  She had posttraumatic change in her right elbow joint from a childhood injury.  She had bilateral DIP thickening consistent with osteoarthritis.  No synovitis was noted.  Hip joints and knee joints were in good range of motion without any warmth swelling or effusion.  Ankle joints were in good range of motion.  There was no tenderness over MTPs.  CDAI Exam: CDAI Score: -- Patient Global: --; Provider Global: -- Swollen: --; Tender: -- Joint Exam 09/12/2022   No joint exam has been documented for this visit   There is currently no information documented on the homunculus. Go to the Rheumatology activity and complete the homunculus joint exam.  Investigation: No  additional findings.  Imaging: No results found.  Recent Labs: Lab Results  Component Value Date   WBC 3.7 05/21/2022   HGB 10.3 (L) 05/21/2022   PLT 236 05/21/2022   NA 139 05/21/2022   K 5.4 (H) 05/21/2022   CL 106 05/21/2022   CO2 19 (L) 05/21/2022   GLUCOSE 103 (H) 05/21/2022   BUN 31 (H) 05/21/2022   CREATININE 1.22 (H) 05/21/2022   BILITOT 0.3 05/21/2022   ALKPHOS 54 05/21/2022   AST 22 05/21/2022   ALT 15 05/21/2022   PROT 7.9 05/21/2022   ALBUMIN 4.8 05/21/2022   CALCIUM 9.3 05/21/2022   GFRAA 65 09/12/2020    Speciality Comments: Prolia 02/2020 DEXA at [facility]  Procedures:  No procedures performed Allergies: Patient has no known allergies.   Assessment / Plan:     Visit Diagnoses: Age-related osteoporosis without current pathological fracture - - DEXA 11/02/19: BMD measured at Femur Neck Right is 0.573 g/cm2 with  a T-score of -3.3.  DEXA scan on March 11, 2022 showed a T-score of -3.4 and BMD 0.475 in the right femoral neck with no comparison.  She did not have much improvement in her DEXA scan.  DEXA scan results were discussed with the patient.  We will continue Prolia and recheck DEXA scan in 2025.  Use of calcium and vitamin D on a regular basis was discussed.  Need for regular exercise was also emphasized.  Her last Prolia injection was in October 2023.  Will get labs today and will schedule her for next Prolia injection in April.  If her bone density does not improve over the next 2 years we may consider [place].  Medication monitoring encounter - Last prolia on 03/18/22 - Plan: CBC with Differential/Platelet, COMPLETE METABOLIC PANEL WITH GFR  Vitamin D deficiency -she has history of vitamin D deficiency.  Her last vitamin D was 77 on March 05, 2022.  Plan: VITAMIN D 25 Hydroxy (Vit-D Deficiency, Fractures)  Primary osteoarthritis of both hands-she had DIP thickening without any synovitis.  Joint protection muscle strengthening was  emphasized.  History of recent fall-she had a fall 1 year ago.  She has had no falls since then.  Chronic pain of right knee-she gives history of intermittent discomfort in her knee joint.  No warmth swelling or effusion was noted.  Pure hypercholesterolemia  Essential hypertension  Type 2 diabetes mellitus without complication, without long-term current use of insulin ([place])  Language barrier-interpreter was present throughout the office visit.  Orders: Orders Placed This Encounter  Procedures   CBC with Differential/Platelet   COMPLETE METABOLIC PANEL WITH GFR   VITAMIN D 25 Hydroxy (Vit-D Deficiency, Fractures)   No orders of the defined types were placed in this encounter.    Follow-Up Instructions: Return in about 6 months (around 03/15/2023) for Osteoporosis.   Bo Merino, MD  Note - This record has been created using Editor, commissioning.  Chart creation errors have been sought, but may not always  have been located. Such creation errors do not reflect on  the standard of medical care.

## 2022-09-05 ENCOUNTER — Other Ambulatory Visit (HOSPITAL_COMMUNITY): Payer: Self-pay

## 2022-09-09 NOTE — Telephone Encounter (Signed)
Prolia received from [facility]. Placed in [place]. Penidng updated labs

## 2022-09-12 ENCOUNTER — Encounter: Payer: Self-pay | Admitting: Rheumatology

## 2022-09-12 ENCOUNTER — Ambulatory Visit: Payer: Medicare Other | Attending: Rheumatology | Admitting: Rheumatology

## 2022-09-12 VITALS — BP 121/65 | HR 69 | Resp 16 | Ht 61.0 in | Wt 124.8 lb

## 2022-09-12 DIAGNOSIS — M25562 Pain in left knee: Secondary | ICD-10-CM

## 2022-09-12 DIAGNOSIS — M25561 Pain in right knee: Secondary | ICD-10-CM

## 2022-09-12 DIAGNOSIS — M810 Age-related osteoporosis without current pathological fracture: Secondary | ICD-10-CM

## 2022-09-12 DIAGNOSIS — Z5181 Encounter for therapeutic drug level monitoring: Secondary | ICD-10-CM | POA: Diagnosis not present

## 2022-09-12 DIAGNOSIS — M25572 Pain in left ankle and joints of left foot: Secondary | ICD-10-CM

## 2022-09-12 DIAGNOSIS — E559 Vitamin D deficiency, unspecified: Secondary | ICD-10-CM

## 2022-09-12 DIAGNOSIS — I10 Essential (primary) hypertension: Secondary | ICD-10-CM

## 2022-09-12 DIAGNOSIS — M19041 Primary osteoarthritis, right hand: Secondary | ICD-10-CM | POA: Diagnosis not present

## 2022-09-12 DIAGNOSIS — E119 Type 2 diabetes mellitus without complications: Secondary | ICD-10-CM

## 2022-09-12 DIAGNOSIS — E7800 Pure hypercholesterolemia, unspecified: Secondary | ICD-10-CM | POA: Diagnosis not present

## 2022-09-12 DIAGNOSIS — Z789 Other specified health status: Secondary | ICD-10-CM | POA: Diagnosis not present

## 2022-09-12 DIAGNOSIS — M19042 Primary osteoarthritis, left hand: Secondary | ICD-10-CM | POA: Diagnosis not present

## 2022-09-12 DIAGNOSIS — G8929 Other chronic pain: Secondary | ICD-10-CM

## 2022-09-12 DIAGNOSIS — Z9181 History of falling: Secondary | ICD-10-CM | POA: Diagnosis not present

## 2022-09-12 NOTE — Patient Instructions (Addendum)
Long x??ng Osteoporosis  Long x??ng x?y ra khi x??ng tr? nn m?ng v km ??c h?n bnh th??ng. Long x??ng khi?n x??ng gin, y?u v d? v? h?n (gy x??ng). Theo th?i gian, long x??ng c th? lm cho x??ng tr? nn qu y?u ??n n?i b? gy sau m?t c ng nh?. Nh?ng x??ng d? b? gy nh?t do long x??ng l x??ng ? hng, c? tay v c?t s?ng. Nguyn nhn g gy ra? Khng r nguyn nhn chnh xc gy ra tnh tr?ng ny. ?i?u g lm t?ng nguy c?? Tnh tr?ng ny d? c kh? n?ng pht sinh h?n n?u qu v?: C ng??i trong gia ?nh b? tnh tr?ng ny. Dinh d??ng km. Dng nh?ng th? sau: Thu?c steroid, ch?ng h?n nh? prednisone. Thu?c ch?ng co gi?t. [place] ho?c thu?c l, ch?ng ha?n nh? thu?c l ?i?u, thu?c l ?i?n t? v thu?c l d?ng nhai. La? n?. 50 tu?i tr? ln. Khng tch c?c ho?t ??ng th? ch?t (t v?n ??ng). C ngu?n g?c chu u ho?c chu . C khung c? th? nh?. C cc d?u hi?u ho?c tri?u ch?ng g? Gy x??ng c th? l d?u hi?u ??u tin c?a long x??ng, ??c bi?t l n?u gy x??ng l do m?t c ng ho?c ch?n th??ng m th??ng khng khi?n cho x??ng b? gy. Cc d?u hi?u v tri?u ch?ng khc bao g?m: ?au ? c? ho?c th?t l?ng. Dng l?ng khom. Chi?u cao gi?m. Ch?n ?on tnh tr?ng ny nh? th? no? Tnh tr?ng ny c th? ???c ch?n ?on d?a vo: B?nh s? c?a qu v?. Khm th?c th?. Ki?m tra m?t ?? ch?t khong trong x??ng, cn g?i l ki?m tra DXA ho?c DEXA (ki?m tra ?o ?? h?p thu tia X n?ng l??ng kp). Ki?m tra ny s? d?ng tia X ?? ?o l??ng ch?t khong trong x??ng. Tnh tr?ng ny ???c ?i?u tr? nh? th? no? Tnh tr?ng ny c th? ???c ?i?u tr? b?ng cch: Th?c hi?n thay ??i l?i s?ng, ch?ng h?n nh?: ??a cc th?c ph?m nhi?u canxi v vitamin D h?n vo ch? ?? ?n. T?p cc bi t?p ch?u s?c n?ng v t?ng c??ng s?c m?nh c? b?p. D?ng s? d?ng thu?c l. Gi?i h?n l??ng r??u tiu th?. Dng thu?c ?? lm ch?m qu trnh m?t x??ng ho?c lm t?ng m?t ?? x??ng. Dng cc ch?t b? sung canxi v vitamin D hng ngy. Dng cc thu?c thay th?  hc mn, ch?ng h?n nh? estrogen dnh cho n? gi?i v testosterone dnh cho nam gi?i. Theo di n?ng ?? canxi v vitamin D. M?c tiu c?a ?i?u tr? long x??ng l t?ng c??ng s?c m?nh c?a x??ng v lm gi?m nguy c? gy x??ng. Tun th? nh?ng h??ng d?n ny ? nh: ?n v u?ng ??a canxi v vitamin D vo ch? ?? ?n c?a qu v?. Canxi l ch?t quan tr?ng cho s?c kh?e c?a x??ng v vitamin D gip c? th? h?p th? canxi. Cc ngu?n canxi v vitamin D t?t bao g?m: M?t s? lo?i c bo nh? c h?i v c ng?. Cc s?n ph?m ???c b? sung canxi v vitamin D (???c t?ng c??ng), ch?ng h?n nh? ng? c?c ???c b? sung dinh d??ng. Lng ?? tr?ng. Pho mt. [place].  Ho?t ??ng T?p th? d?c theo ch? d?n c?a chuyn gia ch?m [place] s?c kh?e. Hy h?i chuyn gia ch?m [place] s?c kh?e bi t?p th? d?c v ho?t ??ng no an ton cho qu v?. Qu v? nn t?p: Cc bi t?p ch?ng l?i tr?ng l?c (bi t?p ch?u ??ng s?c n?ng),  ch?ng h?n nh? thi c?c quy?n, yoga, ho?c ?i b?. Cc bi t?p lm kh?e c? nh? nng t?. L?i s?ng Khng u?ng r??u n?u: Chuyn gia ch?m [place] s?c kh?e khuyn qu v? khng u?ng r??u. Qu v? c [place], c th? c [place], ho?c c k? ho?ch c [place]. N?u qu v? u?ng r??u: Gi?i h?n l??ng r??u qu v? u?ng ? m?c: 0-1 ly/ngy ??i v?i n? gi?i. 0-2 ly/ngy ??i v?i nam gi?i. Bi?t m?t ly c bao nhiu r??u. ? M?, m?t ly t??ng ???ng v?i m?t chai bia 12 ao x? (355 mL), m?t ly r??u vang 5 ao x? (148 mL), ho?c m?t ly r??u m?nh 1 ao x? (44 mL). Khng s? d?ng b?t k? s?n ph?m no c nicotine ho?c thu?c l, ch?ng ha?n nh? thu?c l d?ng ht, thu?c l ?i?n t? v thu?c l d?ng nhai. N?u qu v? c?n gip ?? ?? cai thu?c, hy h?i chuyn gia ch?m [place] s?c kh?e. Ng?n ng?a t ng S? d?ng cc cng c? ?? gip qu v? ?i l?i (cng c? h? tr? di chuy?n) khi c?n nh? g?y, khung t?p ?i, xe ??y ho?c n?ng. Gi? cho cc phng trong nh sng s?a v g?n gng. D?n nh?ng ch??ng ng?i gy v?p ng kh?i l?i ?i nh? dy d? v cc t?m th?m long r?i. L?p cc thanh v?n trong phng t?m v tay v?n an ton trn  c?u thang b?. S? d?ng th?m cao su trong phng t?m v nh?ng khu v?c khc th??ng xuyn ??t ho?c tr?n. ?i giy kn ngn chn v??a v??n v h? tr? bn chn c?a quy? vi?. ?i giy c ?? cao su ho?c gt th?p. Xem xt l?i cc thu?c c?a quy? vi? v?i chuyn gia ch?m [place] s?c kh?e. M?t s? lo?i thu?c c th? gy chng m?t ho?c thay ??i huy?t p, c th? lm t?ng nguy c? b? ng. H??ng d?n chung Ch? s? d?ng thu?c khng k ??n v thu?c k ??n theo ch? d?n c?a chuyn gia ch?m [place] s?c kh?e. Tun th? theo t?t c? cc l?n khm l?i. ?i?u ny c vai tr quan tr?ng. Hy lin l?c v?i chuyn gia ch?m [place] s?c kh?e n?u: Qu v? ch?a t?ng ???c sng l?c long x??ng v qu v?: L n? t? 65 tu?i tr? ln. L nam t? 70 tu?i tr? ln. Yu c?u tr? gip ngay l?p t?c n?u: Qu v? t? ng ho?c t? lm qu v? b? th??ng. Tm t?t Long x??ng l tnh tr?ng m?ng v m?t m?t ?? trong x??ng. Long x??ng khi?n x??ng gin, y?u v d? gy h?n (gy x??ng),ngy c? v?i m?t c ng nh?[place] M?c tiu c?a ?i?u tr? long x??ng l t?ng c??ng s?c m?nh c?a x??ng v lm gi?m nguy c? gy x??ng. ??a canxi v vitamin D vo ch? ?? ?n c?a qu v?. Canxi l ch?t quan tr?ng cho s?c kh?e c?a x??ng v vitamin D gip c? th? h?p th? canxi. Hy trao ??i v?i chuyn gia ch?m [place] s?c kh?e v? sng l?c long x??ng n?u qu v? l n? t? 65 tu?i tr? ln v nam gi?i t? 70 tu?i tr? ln. Thng tin ny khng nh?m m?c ?ch thay th? cho l?i khuyn m chuyn gia ch?m [place] s?c kh?e ni v?i qu v?. Hy b?o ??m qu v? ph?i th?o [place]?n b?t k? v?n ?? g m qu v? c v?i chuyn gia ch?m [place] s?c kh?e c?a qu v?. Document Revised: 01/04/2020 Document Reviewed: 01/04/2020 Elsevier Patient Education  [place]  kh?p Osteoarthritis  Thoi ha kh?p l m?t th? vim kh?p. Thu?t ng? ny ch? ?au kh?p ho?c b?nh kh?p. Thoi ha kh?p lm m bao ph? cc ??u x??ng ? kh?p (s?n) b? th??ng t?n. S?n ?ng vai tr l l?p ??m gi?a cc x??ng v gip x??ng c? ??ng tr?n tru. Thoi ha kh?p x?y ra khi s?n trong  cc kh?p b? mn d?n. ?i khi, thoi ha kh?p ???c g?i l vim kh?p "mn v rch/n?t". Thoi ha kh?p l d?ng ph? bi?n nh?t c?a vim kh?p. B?nh th??ng g?p ? ng??i cao tu?i. ?y l trnh tr?ng ngy cng tr?m tr?ng h?n theo th?i gian. Cc kh?p th??ng b? th??ng t?n nhi?u nh?t b?i tnh tr?ng ny l ? cc ngn tay, ngn chn, kh?p hng, ??u g?i v c?t s?ng, bao g?m c? c? v th?t l?ng. Nguyn nhn g gy ra? Tnh tr?ng ny l do qu trnh mn d?n s?n bao b?c cc ??u x??ng. ?i?u g lm t?ng nguy c?? Nh?ng y?u t? sau c th? khi?n cho qu v? d? b? tnh tr?ng ny h?n: Tu?i t? 50 tr? ln. Bo ph. S? d?ng cc kh?p qu m?c. T?n th??ng tr??c ?y ? kh?p. Ph?u thu?t tr??c ?y ? kh?p. Ti?n s? gia ?nh b? thoi ha kh?p. C cc d?u hi?u ho?c tri?u ch?ng g? Nh?ng tri?u ch?ng chnh c?a tnh tr?ng ny l ?au, s?ng v c?ng ? kh?p. Cc tri?u ch?ng khc c th? bao g?m: Kh?p b? ph ??i. ?au nhi?u h?n v t?n th??ng thm do cc m?nh x??ng ho?c s?n nh? v? ra v tri n?i bn trong kh?p gy ra. M?t l??ng x??ng nh? tch t? (ch?i x??ng) c th? pht tri?n trn cc b? kh?p. C?m gic rt ho?c c? xt trong kh?p khi qu v? c? ??ng kh?p. C ti?ng "l?c c?c" ho?c "l?o x?o" khi qu v? c? ??ng. Kh ?i b? ho?c kh t?p th? d?c. Khng th? c?m n?m ?? v?t, v?n bn tay, ho?c ki?m sot c? ??ng c?a bn tay v ngn tay. Ch?n ?on tnh tr?ng ny nh? th? no? Tnh tr?ng ny c th? ???c ch?n ?on d?a vo: B?nh s? c?a qu v?. Khm th?c th?. Tri?u ch?ng c?a qu v?. Ch?p X-quang cc kh?p b? th??ng t?n. Xt nghi?m mu ?? lo?i tr? cc lo?i vim kh?p khc. Tnh tr?ng ny ???c ?i?u tr? nh? th? no? Khng c cch tr? d?t ?i?m tnh tr?ng ny, nh?ng ?i?u tr? c th? gip ki?m sot ?au v c?i thi?n ch?c n?ng kh?p. ?i?u tr? c th? bao g?m ph?i h?p cc li?u php, ch?ng h?n nh?: Cc k? thu?t gi?m ?au, nh?: Ch??m nng v ch??m l?nh cho kh?p. [place] bp. M?t d?ng li?u php tr chuy?n ???c g?i l li?u php hnh vi nh?n th?c (CBT). Li?u php ny gip qu v?  ??t ra cc m?c tiu v theo di nh?ng thay ??i m qu v? th?c hi?n. Thu?c ?? gi?m ?au v gi?m vim. C th? u?ng thu?c ho?c bi thu?c ln da. Cc lo?i thu?c ? bao g?m: Thu?c ch?ng vim khng steroid (NSAID), ch?ng h?n nh? ibuprofen. Thu?c k ??n. Thu?c khng vim m?nh (corticosteroid). M?t s? lo?i th?c ph?m ch?c n?ng b? sung dinh d??ng nh?t ??nh. M?t ch??ng trnh t?p luy?n theo ch? ??nh. Qu v? c th? lm vi?c v?i bc s? v?t l tr? li?u. Cc d?ng c? h? tr?, nh? l n?p, b?ng, thanh n?p, g?ng tay chuyn d?ng ho?c g?y ch?ng. M?t k? ho?ch ki?m sot cn n?ng. Ph?u thu?t, nh? l:  Th? thu?t ??c x??ng. Th? thu?t ny ???c th?c hi?n ?? ??t l?i v? tr cc x??ng v gi?m ?au ho?c l?y nh?ng m?nh x??ng v s?n r?i ra. Ph?u thu?t thay kh?p. Qu v? c th? c?n ph?u thu?t ny n?u qu b? c tnh tr?ng thoi ha kh?p ti?n tri?n. Tun th? nh?ng h??ng d?n ny ? nh: Ho?t ??ng ?? cho kh?p b? th??ng t?n ngh? ng?i theo ch? d?n c?a chuyn gia ch?m [place] s?c kh?e. T?p th? d?c theo ch? d?n c?a chuyn gia ch?m [place] cho qu v?. Chuyn gia ? c th? khuy?n ngh? cc lo?i bi t?p c? th?, ch?ng h?n nh?: Cc bi t?p t?ng c??ng s?c m?nh. Cc bi t?p ny ???c th?c hi?n ?? t?ng c??ng c? b?p h? tr? cc kh?p b? th??ng t?n b?i vim kh?p. Ho?t ??ng th? d?c nh?p ?i?u. ?y l cc bi t?p th? d?c, ch?ng h?n nh? ?i b? nhanh ho?c th? d?c nh?p ?i?u d??i n??c, lm t?ng nh?p tim c?a qu v?. Cc ho?t ??ng cho ph?m vi v?n ??ng. Nh?ng ho?t ??ng ny gip cc kh?p di chuy?n d? dng h?n. Cc bi t?p cn b?ng v nhanh nh?n. X? tr ?au, c?ng kh?p v s?ng     N?u ???c chi? d?n, ha?y ch???m no?ng va?o vu?ng bi? ?au th??ng xuyn theo chi? d?n cu?a chuyn gia. S? d?ng ngu?n nhi?t m chuyn gia khuy?n ngh?, ch?ng h?n nh? ti ch??m nhi?t ?m ho?c mi?ng ??m ch??m nng. N?u qu v? dng d?ng c? h? tr? tho ra ???c, hy tho d?ng c? ? theo ch? d?n c?a chuyn gia. ?? kh?n t?m ? gi?a da v ngu?n nhi?t. N?u chuyn gia yu c?u qu v? ?? nguyn d?ng c? h? tr?  trong khi ch??m nng, hy ??t m?t chi?c kh?n t?m gi?a d?ng c? h? tr? v ngu?n nhi?t. Duy tr ngu?n nhi?t trong 20-30 pht. N?u ???c ch? d?n, hy ch??m ? l?nh ln vng b? th??ng t?n. N?u qu v? dng d?ng c? h? tr? tho ra ???c, hy tho d?ng c? ? theo ch? d?n c?a chuyn gia. Cho ? l?nh vo ti ni lng. ?? kh?n t?m ? gi?a da v ti ch??m. N?u chuyn gia yu c?u qu v? ?? nguyn d?ng c? h? tr? trong khi ch??m ? l?nh, hy ??t m?t chi?c kh?n t?m gi?a d?ng c? h? tr? v ti ch??m. Ch??m ? l?nh trong 20 pht, 2-3 l?n m?i ngy. N?u da qu v? chuy?n sang mu ?? t??i, hy b? ? l?nh ho?c ngu?n nhi?t ra ngay l?p t?c ?? ng?n ng?a t?n th??ng da. Nguy c? t?n th??ng cao h?n n?u qu v? khng th? c?m th?y ?au, nng ho?c l?nh. C? ??ng cc ngn tay ho?c ngn chn th??ng xuyn ?? lm gi?m c?ng v gi?m s?ng. Nng (nng cao) vng b? th??ng t?n cao h?n m?c tim qu v? khi n?m ho?c ng?i. H??ng d?n chung Ch? s? d?ng thu?c khng k ??n v thu?c k ??n theo ch? d?n c?a chuyn gia ch?m [place] cho qu v?. Duy tr cn n?ng c l?i cho s?c kh?e. Tun th? theo ch? ??n c?a chuyn gia c?a qu v? ?? ki?m sot cn n?ng. Khng s? d?ng b?t k? s?n ph?m no c nicotine ho?c thu?c l. Nh?ng s?n ph?m ny bao g?m thu?c l d?ng ht, thu?c l d?ng nhai v d?ng c? ht h?i thu?c, ch?ng h?n nh? thu?c l ?i?n t?. N?u qu v? c?n gip ?? ?? cai thu?c, hy h?i chuyn gia ch?m [place] cho qu v?. S?  d?ng cc d?ng c? h? tr? theo ch? d?n c?a chuyn gia. N?i tm thm thng tin [employer] of Arthritis and Musculoskeletal and Skin Diseases (Vi?n Vim kh?p v C? x??ng v B?nh da Qu?c gia): [email or web address].es [employer] on Aging (Vi?n Lo khoa Qu?c gia): [email or web address] [employer] ([place] Th?p kh?p M?): [email or web address] Hy lin l?c v?i chuyn gia ch?m [place] s?c kh?e n?u: Qu v? b? ??, s?ng ho?c c?m gic ?m nng ? kh?p tr? nn tr?m tr?ng h?n. Qu v? b? s?t km v?i ?au nh?c kh?p ho?c nh?c c?. Qu v? b? pht ban. Qu v? kh th?c hi?n  cc sinh ho?t bnh th??ng. Qu v? b? ?au tr?m tr?ng h?n v khng thuyn gi?m sau khi dng thu?c gi?m ?au. Thng tin ny khng nh?m m?c ?ch thay th? cho l?i khuyn m chuyn gia ch?m [place] s?c kh?e ni v?i qu v?. Hy b?o ??m qu v? ph?i th?o [place]?n b?t k? v?n ?? g m qu v? c v?i chuyn gia ch?m [place] s?c kh?e c?a qu v?. Document Revised: 02/19/2022 Document Reviewed: 02/19/2022 Elsevier Patient Education  [place].

## 2022-09-12 NOTE — Telephone Encounter (Signed)
Pending baseline labs - Prolia appt scheduled for 09/17/22  Knox Saliva, PharmD, MPH, BCPS, CPP Clinical Pharmacist (Rheumatology and Pulmonology)

## 2022-09-13 LAB — CBC WITH DIFFERENTIAL/PLATELET
Absolute Monocytes: 482 cells/uL (ref 200–950)
Basophils Absolute: 58 cells/uL (ref 0–200)
Basophils Relative: 1.1 %
Eosinophils Absolute: 413 cells/uL (ref 15–500)
Eosinophils Relative: 7.8 %
HCT: 33.4 % — ABNORMAL LOW (ref 35.0–45.0)
Hemoglobin: 10.8 g/dL — ABNORMAL LOW (ref 11.7–15.5)
Lymphs Abs: 2724 cells/uL (ref 850–3900)
MCH: 31 pg (ref 27.0–33.0)
MCHC: 32.3 g/dL (ref 32.0–36.0)
MCV: 96 fL (ref 80.0–100.0)
MPV: 10.5 fL (ref 7.5–12.5)
Monocytes Relative: 9.1 %
Neutro Abs: 1622 cells/uL (ref 1500–7800)
Neutrophils Relative %: 30.6 %
Platelets: 227 10*3/uL (ref 140–400)
RBC: 3.48 10*6/uL — ABNORMAL LOW (ref 3.80–5.10)
RDW: 12.2 % (ref 11.0–15.0)
Total Lymphocyte: 51.4 %
WBC: 5.3 10*3/uL (ref 3.8–10.8)

## 2022-09-13 LAB — COMPLETE METABOLIC PANEL WITH GFR
AG Ratio: 1.2 (calc) (ref 1.0–2.5)
ALT: 19 U/L (ref 6–29)
AST: 20 U/L (ref 10–35)
Albumin: 4.4 g/dL (ref 3.6–5.1)
Alkaline phosphatase (APISO): 52 U/L (ref 37–153)
BUN/Creatinine Ratio: 33 (calc) — ABNORMAL HIGH (ref 6–22)
BUN: 41 mg/dL — ABNORMAL HIGH (ref 7–25)
CO2: 25 mmol/L (ref 20–32)
Calcium: 10.2 mg/dL (ref 8.6–10.4)
Chloride: 106 mmol/L (ref 98–110)
Creat: 1.26 mg/dL — ABNORMAL HIGH (ref 0.50–1.05)
Globulin: 3.6 g/dL (calc) (ref 1.9–3.7)
Glucose, Bld: 99 mg/dL (ref 65–99)
Potassium: 5 mmol/L (ref 3.5–5.3)
Sodium: 139 mmol/L (ref 135–146)
Total Bilirubin: 0.4 mg/dL (ref 0.2–1.2)
Total Protein: 8 g/dL (ref 6.1–8.1)
eGFR: 46 mL/min/{1.73_m2} — ABNORMAL LOW (ref >=60)

## 2022-09-13 LAB — VITAMIN D 25 HYDROXY (VIT D DEFICIENCY, FRACTURES): Vit D, 25-Hydroxy: 44 ng/mL (ref 30–100)

## 2022-09-13 NOTE — Progress Notes (Signed)
Hemoglobin is low and stable.  Creatinine is elevated and stable.  Calcium is normal.  Vitamin D is in the desirable range.  Please forward results to patient's PCP.

## 2022-09-13 NOTE — Telephone Encounter (Signed)
Labs from 09/12/22 stable. Ok to proceed with Prolia on 09/17/22  Knox Saliva, PharmD, MPH, BCPS, CPP Clinical Pharmacist (Rheumatology and Pulmonology)

## 2022-09-16 ENCOUNTER — Encounter: Payer: Self-pay | Admitting: *Deleted

## 2022-09-17 ENCOUNTER — Ambulatory Visit: Payer: Medicare Other | Attending: Rheumatology | Admitting: Pharmacist

## 2022-09-17 ENCOUNTER — Ambulatory Visit: Payer: Medicare Other | Admitting: Pharmacist

## 2022-09-17 DIAGNOSIS — Z7689 Persons encountering health services in other specified circumstances: Secondary | ICD-10-CM

## 2022-09-17 DIAGNOSIS — Z79899 Other long term (current) drug therapy: Secondary | ICD-10-CM | POA: Diagnosis not present

## 2022-09-17 DIAGNOSIS — M810 Age-related osteoporosis without current pathological fracture: Secondary | ICD-10-CM

## 2022-09-17 MED ORDER — DENOSUMAB 60 MG/ML ~~LOC~~ SOSY
60.0000 mg | PREFILLED_SYRINGE | Freq: Once | SUBCUTANEOUS | Status: AC
  Administered 2022-09-17: 60 mg via SUBCUTANEOUS

## 2022-09-17 NOTE — Progress Notes (Signed)
Pharmacy Note  Subjective:   Patient presents to clinic today to receive bi-annual dose of Prolia. Patient's last dose of Prolia was on 03/18/2022. Interpreter was called over the phone today (interpreter # [phone])  Patient running a fever or have signs/symptoms of infection? No  Patient currently on antibiotics for the treatment of infection? No  Patient had fall in the last 6 months?  No  Patient taking calcium 1200 mg daily through diet or supplement and at least 800 units vitamin D? Yes  Objective: CMP     Component Value Date/Time   NA 139 09/12/2022 1000   NA 139 05/21/2022 1019   K 5.0 09/12/2022 1000   CL 106 09/12/2022 1000   CO2 25 09/12/2022 1000   GLUCOSE 99 09/12/2022 1000   BUN 41 (H) 09/12/2022 1000   BUN 31 (H) 05/21/2022 1019   CREATININE 1.26 (H) 09/12/2022 1000   CALCIUM 10.2 09/12/2022 1000   PROT 8.0 09/12/2022 1000   PROT 7.9 05/21/2022 1019   ALBUMIN 4.8 05/21/2022 1019   AST 20 09/12/2022 1000   ALT 19 09/12/2022 1000   ALKPHOS 54 05/21/2022 1019   BILITOT 0.4 09/12/2022 1000   BILITOT 0.3 05/21/2022 1019   GFRNONAA 56 (L) 09/12/2020 1336   GFRAA 65 09/12/2020 1336    CBC    Component Value Date/Time   WBC 5.3 09/12/2022 1000   RBC 3.48 (L) 09/12/2022 1000   HGB 10.8 (L) 09/12/2022 1000   HGB 10.3 (L) 05/21/2022 1019   HCT 33.4 (L) 09/12/2022 1000   HCT 31.8 (L) 05/21/2022 1019   PLT 227 09/12/2022 1000   PLT 236 05/21/2022 1019   MCV 96.0 09/12/2022 1000   MCV 94 05/21/2022 1019   MCH 31.0 09/12/2022 1000   MCHC 32.3 09/12/2022 1000   RDW 12.2 09/12/2022 1000   RDW 12.0 05/21/2022 1019   LYMPHSABS 2,724 09/12/2022 1000   LYMPHSABS 3.9 (H) 11/18/2016 0936   EOSABS 413 09/12/2022 1000   EOSABS 0.6 (H) 11/18/2016 0936   BASOSABS 58 09/12/2022 1000   BASOSABS 0.0 11/18/2016 0936    Lab Results  Component Value Date   VD25OH 44 09/12/2022   DEXA 11/02/19: BMD measured at Femur Neck Right is 0.573 g/cm2 with a T-score of -3.3.  DEXA  scan on March 11, 2022 showed a T-score of -3.4 and BMD 0.475 in the right femoral neck with no comparison.  She did not have much improvement in her DEXA scan   Assessment/Plan:   Reviewed importance of adequate dietary intake of calcium in addition to supplementation due to risk of hypocalcemia with Prolia.   Patient tolerated injection without issue.   Administrations This Visit     denosumab (PROLIA) injection 60 mg     Admin Date 09/17/2022 Action Given Dose 60 mg Route Subcutaneous Administered By Cassandria Anger, RPH-CPP           Patient's next Prolia dose is due on 03/16/23.  Patient is due for updated DEXA in September 2025.   All questions encouraged and answered.  Instructed patient to call with any further questions or concerns.  Knox Saliva, PharmD, MPH, BCPS, CPP Clinical Pharmacist (Rheumatology and Pulmonology)

## 2022-10-07 ENCOUNTER — Other Ambulatory Visit: Payer: Self-pay | Admitting: Internal Medicine

## 2022-10-07 DIAGNOSIS — E7800 Pure hypercholesterolemia, unspecified: Secondary | ICD-10-CM

## 2022-10-09 ENCOUNTER — Other Ambulatory Visit: Payer: Self-pay | Admitting: Internal Medicine

## 2022-10-09 DIAGNOSIS — Z1231 Encounter for screening mammogram for malignant neoplasm of breast: Secondary | ICD-10-CM

## 2022-10-12 ENCOUNTER — Other Ambulatory Visit: Payer: Self-pay | Admitting: Internal Medicine

## 2022-10-14 DIAGNOSIS — N1831 Chronic kidney disease, stage 3a: Secondary | ICD-10-CM | POA: Diagnosis not present

## 2022-10-14 DIAGNOSIS — D649 Anemia, unspecified: Secondary | ICD-10-CM | POA: Diagnosis not present

## 2022-10-14 DIAGNOSIS — E1122 Type 2 diabetes mellitus with diabetic chronic kidney disease: Secondary | ICD-10-CM | POA: Diagnosis not present

## 2022-10-14 DIAGNOSIS — E875 Hyperkalemia: Secondary | ICD-10-CM | POA: Diagnosis not present

## 2022-10-14 DIAGNOSIS — I129 Hypertensive chronic kidney disease with stage 1 through stage 4 chronic kidney disease, or unspecified chronic kidney disease: Secondary | ICD-10-CM | POA: Diagnosis not present

## 2022-10-14 DIAGNOSIS — E785 Hyperlipidemia, unspecified: Secondary | ICD-10-CM | POA: Diagnosis not present

## 2022-10-14 LAB — COMPREHENSIVE METABOLIC PANEL
Albumin: 4.7 (ref 3.5–5.0)
Calcium: 9.4 (ref 8.7–10.7)
Globulin: 3.5
eGFR: 59

## 2022-10-14 LAB — BASIC METABOLIC PANEL
BUN: 23 — AB (ref 4–21)
CO2: 20 (ref 13–22)
Chloride: 103 (ref 99–108)
Creatinine: 1 (ref 0.5–1.1)
Glucose: 118
Potassium: 5 mEq/L (ref 3.5–5.1)
Sodium: 138 (ref 137–147)

## 2022-10-14 LAB — IRON,TIBC AND FERRITIN PANEL
%SAT: 24
Ferritin: 175
Iron: 71
TIBC: 298
UIBC: 227

## 2022-10-14 LAB — CBC AND DIFFERENTIAL
HCT: 33 — AB (ref 36–46)
Hemoglobin: 10.7 — AB (ref 12.0–16.0)
Neutrophils Absolute: 1.8
Platelets: 254 10*3/uL (ref 150–400)
WBC: 4.6

## 2022-10-14 LAB — HEPATIC FUNCTION PANEL
ALT: 29 U/L (ref 7–35)
AST: 33 (ref 13–35)
Alkaline Phosphatase: 53 (ref 25–125)
Bilirubin, Total: 0.4

## 2022-10-14 LAB — MICROALBUMIN / CREATININE URINE RATIO: Microalb Creat Ratio: 30

## 2022-10-14 LAB — CBC: RBC: 3.5 — AB (ref 3.87–5.11)

## 2022-10-15 ENCOUNTER — Other Ambulatory Visit: Payer: Self-pay | Admitting: Internal Medicine

## 2022-10-15 DIAGNOSIS — N1831 Chronic kidney disease, stage 3a: Secondary | ICD-10-CM

## 2022-10-18 ENCOUNTER — Ambulatory Visit
Admission: RE | Admit: 2022-10-18 | Discharge: 2022-10-18 | Disposition: A | Discharge status: Home / Self Care | Payer: Medicare Other | Source: Ambulatory Visit | Attending: Internal Medicine | Admitting: Internal Medicine

## 2022-10-18 DIAGNOSIS — Z1231 Encounter for screening mammogram for malignant neoplasm of breast: Secondary | ICD-10-CM

## 2022-11-08 ENCOUNTER — Ambulatory Visit
Admission: RE | Admit: 2022-11-08 | Discharge: 2022-11-08 | Disposition: A | Discharge status: Home / Self Care | Payer: Medicare Other | Source: Ambulatory Visit | Attending: Internal Medicine | Admitting: Internal Medicine

## 2022-11-08 DIAGNOSIS — N189 Chronic kidney disease, unspecified: Secondary | ICD-10-CM | POA: Diagnosis not present

## 2022-11-08 DIAGNOSIS — N1831 Chronic kidney disease, stage 3a: Secondary | ICD-10-CM

## 2022-11-25 ENCOUNTER — Encounter: Payer: Self-pay | Admitting: *Deleted

## 2022-11-25 NOTE — Progress Notes (Signed)
[facility] Quality Team Note  Name: Cindy Ross Date of Birth: Sep 26, 1950 MRN: [id number] Date: 11/25/2022  [facility] Quality Team has reviewed this patient's chart, please see recommendations below:  [facility] Quality Other; Pt has open gap for A1C.  Has ov 03/13/23.  Would provider be able to address at ov?

## 2023-01-04 ENCOUNTER — Other Ambulatory Visit: Payer: Self-pay | Admitting: Internal Medicine

## 2023-01-04 DIAGNOSIS — E1122 Type 2 diabetes mellitus with diabetic chronic kidney disease: Secondary | ICD-10-CM

## 2023-01-13 DIAGNOSIS — E113293 Type 2 diabetes mellitus with mild nonproliferative diabetic retinopathy without macular edema, bilateral: Secondary | ICD-10-CM | POA: Diagnosis not present

## 2023-01-13 LAB — HM DIABETES EYE EXAM

## 2023-02-18 ENCOUNTER — Other Ambulatory Visit (HOSPITAL_COMMUNITY): Payer: Self-pay

## 2023-02-22 ENCOUNTER — Other Ambulatory Visit: Payer: Self-pay | Admitting: Internal Medicine

## 2023-02-22 DIAGNOSIS — E7800 Pure hypercholesterolemia, unspecified: Secondary | ICD-10-CM

## 2023-03-04 NOTE — Progress Notes (Signed)
Office Visit Note  Patient: Cindy Ross             Date of Birth: 01-Apr-1951           MRN: [id number]             PCP: Dorothyann Peng, MD Referring: Dorothyann Peng, MD Visit Date: 03/18/2023 Occupation: @GUAROCC @  Interpreter: Ellwood Dense   Subjective:  Due for Prolia today  History of Present Illness: Cindy Ross is a 72 y.o. female with history of osteoporosis and osteoarthritis.  Patient's last dose of prolia was administered on 09/17/22.She has tolerated Prolia in the past with no side effects.  She is due for her Prolia injection today.  She denies any falls or fractures.  She continues to take a calcium and vitamin D supplement daily. Patient continues to have intermittent discomfort involving both hands due to underlying osteoarthritis.  She denies any joint swelling.  She massages her hands which alleviates her discomfort.  She has some discomfort involving her knee joints if standing for prolonged periods of time.  She denies any spinal pain.  She denies any new questions or concerns.  Activities of Daily Living:  Patient reports morning stiffness for 0 minutes.   Patient Reports nocturnal pain.  Difficulty dressing/grooming: Denies Difficulty climbing stairs: Denies Difficulty getting out of chair: Denies Difficulty using hands for taps, buttons, cutlery, and/or writing: Denies  Review of Systems  Constitutional:  Negative for fatigue.  HENT:  Positive for mouth dryness. Negative for mouth sores.   Eyes:  Negative for dryness.  Respiratory:  Negative for shortness of breath.   Cardiovascular:  Negative for chest pain and palpitations.  Gastrointestinal:  Positive for constipation. Negative for blood in stool and diarrhea.  Endocrine: Negative for increased urination.  Genitourinary:  Positive for nocturia. Negative for involuntary urination.  Musculoskeletal:  Positive for joint pain and joint pain. Negative for gait problem, joint swelling, myalgias, muscle weakness,  morning stiffness, muscle tenderness and myalgias.  Skin:  Negative for color change, rash, hair loss and sensitivity to sunlight.  Allergic/Immunologic: Negative for susceptible to infections.  Neurological:  Positive for dizziness and headaches.  Hematological:  Negative for swollen glands.  Psychiatric/Behavioral:  Negative for depressed mood and sleep disturbance. The patient is not nervous/anxious.     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   BRBPR (bright red blood per rectum) 10/03/2020   Normocytic anemia 10/03/2020   Age-related osteoporosis without current pathological fracture 03/30/2020   Language barrier 03/30/2020   Respiratory crackles at left lung base 05/19/2017   Essential hypertension 01/30/2016   DM type 2 (diabetes mellitus, type 2) (HCC) 01/30/2016   Hyperlipidemia 01/30/2016    Past Medical History:  Diagnosis Date   Diabetes mellitus without complication (HCC)    Hyperlipidemia 01/30/2016   Hypertension 2007   Respiratory crackles at left lung base 05/19/2017   CXR 02/2017 in [facility] Every[facility] normal    Family History  Problem Relation Age of Onset   Hyperlipidemia Sister    Diabetes Sister    Hypertension Sister    Heart disease Brother    Healthy Son    Leukemia Brother    History reviewed. No pertinent surgical history. Social History   Social History Narrative   Originally from [place]   Came to [employer]. In 45   Lives with younger sister, Cindy Ross   Son lives in [place].  Works  in [employer].     Immunization History  Administered Date(s) Administered   Fluad Quad(high Dose 65+) 04/18/2020, 04/02/2021, 02/28/2022   Fluad Trivalent(High Dose 65+) 03/13/2023   Influenza Inj Mdck Quad Pf 07/31/2016, 05/19/2017   Influenza, High Dose Seasonal PF 04/30/2018, 03/30/2019   Influenza-Unspecified 04/17/2018, 03/30/2019   PFIZER Comirnaty(Gray Top)Covid-19  Tri-Sucrose Vaccine 12/14/2020   PFIZER(Purple Top)SARS-COV-2 Vaccination 07/23/2019, 08/13/2019, 03/14/2020   PNEUMOCOCCAL CONJUGATE-20 08/31/2020   Pneumococcal Conjugate-13 09/14/2018, 12/29/2018   Pneumococcal Polysaccharide-23 03/19/2016   Td 10/31/1994, 04/10/1995   Tdap 01/30/2016   Zoster Recombinant(Shingrix) 05/06/2019, 07/12/2019     Objective: Vital Signs: BP 134/76 (BP Location: Left Arm, Patient Position: Sitting, Cuff Size: Normal)   Pulse 73   Resp 13   Ht 5' 0.5" (1.537 m)   Wt 126 lb 12.8 oz (57.5 kg)   BMI 24.36 kg/m    Physical Exam Vitals and nursing note reviewed.  Constitutional:      Appearance: She is well-developed.  HENT:     Head: Normocephalic and atraumatic.  Eyes:     Conjunctiva/sclera: Conjunctivae normal.  Cardiovascular:     Rate and Rhythm: Normal rate and regular rhythm.     Heart sounds: Normal heart sounds.  Pulmonary:     Effort: Pulmonary effort is normal.     Breath sounds: Normal breath sounds.  Abdominal:     General: Bowel sounds are normal.     Palpations: Abdomen is soft.  Musculoskeletal:     Cervical back: Normal range of motion.  Lymphadenopathy:     Cervical: No cervical adenopathy.  Skin:    General: Skin is warm and dry.     Capillary Refill: Capillary refill takes less than 2 seconds.  Neurological:     Mental Status: She is alert and oriented to person, place, and time.  Psychiatric:        Behavior: Behavior normal.      Musculoskeletal Exam: C-spine, thoracic spine, lumbar spine good range of motion.  No midline spinal tenderness.  No SI joint tenderness.  Shoulder joints have good range of motion.  Wrist joints, MCPs, PIPs, DIPs have good range of motion with no synovitis.  Thickening of DIP joints consistent with osteoarthritis.  No synovitis noted.  Hip joints have good range of motion with no groin pain.  Knee joints have good range of motion with no warmth or effusion.  Ankle joints have good range of  motion with no tenderness or joint swelling.  CDAI Exam: CDAI Score: -- Patient Global: --; Provider Global: -- Swollen: --; Tender: -- Joint Exam 03/18/2023   No joint exam has been documented for this visit   There is currently no information documented on the homunculus. Go to the Rheumatology activity and complete the homunculus joint exam.  Investigation: No additional findings.  Imaging: No results found.  Recent Labs: Lab Results  Component Value Date   WBC 4.8 03/13/2023   HGB 10.2 (L) 03/13/2023   PLT 246 03/13/2023   NA 142 03/13/2023   K 4.3 03/13/2023   CL 105 03/13/2023   CO2 23 03/13/2023   GLUCOSE 102 (H) 03/13/2023   BUN 22 03/13/2023   CREATININE 1.05 (H) 03/13/2023   BILITOT 0.3 03/13/2023   ALKPHOS 56 03/13/2023   AST 28 03/13/2023   ALT 23 03/13/2023   PROT 8.0 03/13/2023   ALBUMIN 4.7 03/13/2023   CALCIUM 10.1 03/13/2023   GFRAA 65 09/12/2020    Speciality Comments: Prolia 02/2020 DEXA at  [facility]  Procedures:  No procedures performed Allergies: Patient has no known allergies.       Assessment / Plan:     Visit Diagnoses: Age-related osteoporosis without current pathological fracture -  Previous DEXA 11/02/19: BMD measured at Femur Neck Right is 0.573 g/cm2 with a T-score of -3.3.   Updated DEXA scan on March 11, 2022 showed a T-score of -3.4 and BMD 0.475 in the right femoral neck with no comparison.  She did not have much improvement.   Patient remains on Prolia 60 mg subcu days injections once every 6 months.  Her last Prolia injection was administered on 09/17/2022.  She has tolerated Prolia without any side effects or injection site reactions.  She is due for her next Prolia injection today.  She tolerated the injection without any side effects or injection site reactions.   She continues to take vitamin D 1000 units daily and has been obtaining calcium in her diet.   No falls or fractures.   Due to update DEXA in  September 2025.   She will follow up in 6 months or sooner if needed.  Plan: denosumab (PROLIA) injection 60 mg  Medication monitoring encounter - Prolia administered today 03/18/23.   Vitamin D WNL on 03/13/23. PTH WNL and phosphorus WNL on 03/13/23. CBC and CMP updated on 03/13/23.  Vitamin D deficiency: Vitamin D was 47.5 on 03/13/2023.  She is taking vitamin D 1000 units daily.  Primary osteoarthritis of both hands: DIP thickening but no synovitis noted.  Complete fist formation noted bilaterally.  Chronic pain of right knee: She has difficulty standing for prolonged periods of time.  No warmth or effusion noted.  Other medical conditions are listed as follows:  Pure hypercholesterolemia  Essential hypertension: Blood pressure is 134/76 today in the office.  Type 2 diabetes mellitus without complication, without long-term current use of insulin (HCC)  Language barrier  Orders: No orders of the defined types were placed in this encounter.  Meds ordered this encounter  Medications   denosumab (PROLIA) injection 60 mg    Order Specific Question:   Patient is enrolled in [facility] program for this medication and I have provided a copy of the Prolia Medication Guide and Patient Brochure.    Answer:   Yes    Order Specific Question:   I have reviewed with the patient the information in the Prolia Medication Guide and Patient Counseling Chart including the serious risks of Prolia and symptoms of each risk.    Answer:   Yes    Order Specific Question:   I have advised the patient to seek medical attention if they have signs or symptoms of any of the serious risks.    Answer:   Yes     Follow-Up Instructions: Return in about 6 months (around 09/16/2023) for Osteoporosis.   Gearldine Bienenstock, PA-C  Note - This record has been created using Dragon software.  Chart creation errors have been sought, but may not always  have been located. Such creation errors do not reflect on  the standard of  medical care.

## 2023-03-05 ENCOUNTER — Other Ambulatory Visit (HOSPITAL_COMMUNITY): Payer: Self-pay

## 2023-03-05 ENCOUNTER — Telehealth: Payer: Self-pay | Admitting: Pharmacist

## 2023-03-05 ENCOUNTER — Other Ambulatory Visit: Payer: Self-pay

## 2023-03-05 DIAGNOSIS — Z79899 Other long term (current) drug therapy: Secondary | ICD-10-CM

## 2023-03-05 DIAGNOSIS — M810 Age-related osteoporosis without current pathological fracture: Secondary | ICD-10-CM

## 2023-03-05 MED ORDER — DENOSUMAB 60 MG/ML ~~LOC~~ SOSY
60.0000 mg | PREFILLED_SYRINGE | SUBCUTANEOUS | 0 refills | Status: DC
Start: 2023-03-05 — End: 2023-08-25
  Filled 2023-03-05: qty 1, 180d supply, fill #0

## 2023-03-05 NOTE — Telephone Encounter (Signed)
Received message from Dr. Allyne Gee that she will have Prolia labs drawn at her OV

## 2023-03-05 NOTE — Telephone Encounter (Signed)
Patient due for Prolia on 03/16/23. Per test claim, copay is $4.60.  Staff message sent to Dr. Allyne Gee, PCP requesting pt have CBC, CMP, Vitamin D drawn at their upcoming visit. Rx for Prolia sent to [facility] to be couriered to clinic by 03/18/2023  Chesley Mires, PharmD, MPH, BCPS, CPP Clinical Pharmacist (Rheumatology and Pulmonology)

## 2023-03-13 ENCOUNTER — Encounter: Payer: Self-pay | Admitting: Internal Medicine

## 2023-03-13 ENCOUNTER — Ambulatory Visit (INDEPENDENT_AMBULATORY_CARE_PROVIDER_SITE_OTHER): Payer: Medicare Other | Admitting: Internal Medicine

## 2023-03-13 ENCOUNTER — Ambulatory Visit: Payer: Medicare Other

## 2023-03-13 VITALS — BP 128/70 | HR 77 | Temp 98.0°F | Ht 61.0 in | Wt 126.8 lb

## 2023-03-13 VITALS — BP 132/70 | HR 88 | Temp 98.0°F | Ht 61.0 in | Wt 126.0 lb

## 2023-03-13 DIAGNOSIS — E1122 Type 2 diabetes mellitus with diabetic chronic kidney disease: Secondary | ICD-10-CM

## 2023-03-13 DIAGNOSIS — Z0000 Encounter for general adult medical examination without abnormal findings: Secondary | ICD-10-CM | POA: Diagnosis not present

## 2023-03-13 DIAGNOSIS — I129 Hypertensive chronic kidney disease with stage 1 through stage 4 chronic kidney disease, or unspecified chronic kidney disease: Secondary | ICD-10-CM | POA: Diagnosis not present

## 2023-03-13 DIAGNOSIS — E7800 Pure hypercholesterolemia, unspecified: Secondary | ICD-10-CM | POA: Diagnosis not present

## 2023-03-13 DIAGNOSIS — N1831 Chronic kidney disease, stage 3a: Secondary | ICD-10-CM

## 2023-03-13 DIAGNOSIS — Z23 Encounter for immunization: Secondary | ICD-10-CM | POA: Diagnosis not present

## 2023-03-13 DIAGNOSIS — M810 Age-related osteoporosis without current pathological fracture: Secondary | ICD-10-CM | POA: Diagnosis not present

## 2023-03-13 DIAGNOSIS — D649 Anemia, unspecified: Secondary | ICD-10-CM | POA: Diagnosis not present

## 2023-03-13 NOTE — Progress Notes (Signed)
Subjective:   Cindy Ross is a 72 y.o. female who presents for Medicare Annual (Subsequent) preventive examination.  Visit Complete: In person    Cardiac Risk Factors include: advanced age (>33men, >97 women);diabetes mellitus;dyslipidemia;hypertension     Objective:    Today's Vitals   03/13/23 0842 03/13/23 0901  BP: 132/70 128/70  Pulse: 77   Temp: 98 F (36.7 C)   TempSrc: Oral   Weight: 126 lb 12.8 oz (57.5 kg)   Height: 5\' 1"  (1.549 m)    Body mass index is 23.96 kg/m.     03/13/2023    8:52 AM 02/28/2022    8:24 AM 02/22/2021    9:34 AM 01/20/2020   12:25 PM 12/15/2018    8:59 AM 11/18/2016    9:15 AM  Advanced Directives  Does Patient Have a Medical Advance Directive? No No No No No No  Would patient like information on creating a medical advance directive? No - Patient declined   No - Patient declined Yes (MAU/Ambulatory/Procedural Areas - Information given) Yes (MAU/Ambulatory/Procedural Areas - Information given)    Current Medications (verified) Outpatient Encounter Medications as of 03/13/2023  Medication Sig   Accu-Chek FastClix Lancets MISC : Use as directed to check blood sugars 2 times per day dx:e11.22   ACCU-CHEK GUIDE test strip Use as instructed   aspirin EC 81 MG tablet Take 1 tablet (81 mg total) by mouth daily.   atorvastatin (LIPITOR) 20 MG tablet TAKE 1 TABLET BY MOUTH EVERY DAY   Blood Glucose Monitoring Suppl (ACCU-CHEK GUIDE) w/Device KIT : Use as directed to check blood sugars 2 times per day dx:e11.22   cholecalciferol (VITAMIN D) 1000 units tablet Take 1,000 Units by mouth daily.   hydrocortisone (ANUSOL-HC) 2.5 % rectal cream Place 1 Application rectally 2 (two) times daily. As needed   ibuprofen (ADVIL) 800 MG tablet Take 800 mg by mouth every 6 (six) hours as needed.   metFORMIN (GLUCOPHAGE) 1000 MG tablet TAKE 1 TABLET BY MOUTH TWICE A DAY   Multiple Vitamins-Minerals (ONE-A-DAY WOMENS 50+ ADVANTAGE PO) Take 1 tablet by mouth daily at 12  noon.   olmesartan-hydrochlorothiazide (BENICAR HCT) 40-12.5 MG tablet TAKE 1 TABLET BY MOUTH EVERY DAY   CLENPIQ 10-3.5-12 MG-GM -GM/160ML SOLN  (Patient not taking: Reported on 03/05/2022)   denosumab (PROLIA) 60 MG/ML SOSY injection Inject 60 mg into the skin every 6 (six) months. Courier to rheum: [address], [address], [place] [address]. Appt on 03/18/2023 (Patient not taking: Reported on 03/13/2023)   No facility-administered encounter medications on file as of 03/13/2023.    Allergies (verified) Patient has no known allergies.   History: Past Medical History:  Diagnosis Date   Diabetes mellitus without complication (HCC)    Hyperlipidemia 01/30/2016   Hypertension 2007   Respiratory crackles at left lung base 05/19/2017   CXR 02/2017 in [facility] normal   History reviewed. No pertinent surgical history. Family History  Problem Relation Age of Onset   Hyperlipidemia Sister    Diabetes Sister    Hypertension Sister    Heart disease Brother    Healthy Son    Leukemia Brother    Social History   Socioeconomic History   Marital status: Widowed    Spouse name: Not on file   Number of children: 1   Years of education: 35   Highest education level: Not on file  Occupational History   Occupation: house cleaning.    Comment: Worked for bakery in  past   Occupation: retired  Tobacco Use   Smoking status: Never    Passive exposure: Never   Smokeless tobacco: Never  Vaping Use   Vaping status: Never Used  Substance and Sexual Activity   Alcohol use: No    Alcohol/week: 0.0 standard drinks of alcohol   Drug use: No   Sexual activity: Not Currently  Other Topics Concern   Not on file  Social History Narrative   Originally from [place]   Came to [employer]. In 50   Lives with younger sister, Bernell Zupko   Son lives in [place].  Works in [employer].     Social Determinants of Health   Financial Resource Strain: Low Risk  (03/13/2023)   Overall  Financial Resource Strain (CARDIA)    Difficulty of Paying Living Expenses: Not hard at all  Food Insecurity: No Food Insecurity (03/13/2023)   Hunger Vital Sign    Worried About Running Out of Food in the Last Year: Never true    Ran Out of Food in the Last Year: Never true  Transportation Needs: No Transportation Needs (03/13/2023)   PRAPARE - Administrator, Civil Service (Medical): No    Lack of Transportation (Non-Medical): No  Physical Activity: Sufficiently Active (03/13/2023)   Exercise Vital Sign    Days of Exercise per Week: 7 days    Minutes of Exercise per Session: 60 min  Stress: No Stress Concern Present (03/13/2023)   [employer] of Occupational Health - Occupational Stress Questionnaire    Feeling of Stress : Not at all  Social Connections: Moderately Isolated (03/13/2023)   Social Connection and Isolation Panel [NHANES]    Frequency of Communication with Friends and Family: Three times a week    Frequency of Social Gatherings with Friends and Family: More than three times a week    Attends Religious Services: More than 4 times per year    Active Member of Clubs or Organizations: No    Attends Banker Meetings: Never    Marital Status: Widowed    Tobacco Counseling Counseling given: Not Answered   Clinical Intake:  Pre-visit preparation completed: Yes  Pain : No/denies pain     Nutritional Status: BMI of 19-24  Normal Nutritional Risks: None Diabetes: Yes CBG done?: No Did pt. bring in CBG monitor from home?: No  How often do you need to have someone help you when you read instructions, pamphlets, or other written materials from your doctor or pharmacy?: 3 - Sometimes     Information entered by :: NAllen LPN   Activities of Daily Living    03/13/2023    8:44 AM  In your present state of health, do you have any difficulty performing the following activities:  Hearing? 0  Vision? 0  Difficulty concentrating or making  decisions? 0  Walking or climbing stairs? 0  Dressing or bathing? 0  Doing errands, shopping? 0  Preparing Food and eating ? N  Using the Toilet? N  In the past six months, have you accidently leaked urine? N  Do you have problems with loss of bowel control? N  Managing your Medications? N  Managing your Finances? N  Housekeeping or managing your Housekeeping? N    Patient Care Team: Dorothyann Peng, MD as PCP - General (Internal Medicine) Shea Evans, Genice Rouge [facility])  Indicate any recent Medical Services you may have received from other than [facility] providers in the past year (date may be approximate).  Assessment:   This is a routine wellness examination for Kimiye.  Hearing/Vision screen Hearing Screening - Comments:: Denies hearing issues Vision Screening - Comments:: Regular eye exams, Dr. London Sheer    Goals Addressed             This Visit's Progress    Patient Stated       03/13/2023, stay healthy       Depression Screen    03/13/2023    8:53 AM 05/21/2022    9:10 AM 02/28/2022    8:26 AM 02/22/2021    9:36 AM 01/20/2020   12:27 PM 01/20/2020   11:41 AM 12/15/2018    9:00 AM  PHQ 2/9 Scores  PHQ - 2 Score 0 0 0 0 0 0 0  PHQ- 9 Score 0    0  0    Fall Risk    03/13/2023    8:53 AM 05/21/2022    9:10 AM 02/28/2022    8:24 AM 02/22/2021    9:34 AM 01/20/2020   12:26 PM  Fall Risk   Falls in the past year? 0 1 1 1 1   Comment   slipped on water missed a step   Number falls in past yr: 0 0 0  1  Injury with Fall? 0 1 1 0 0  Comment   sprained ankle    Risk for fall due to : Medication side effect No Fall Risks Medication side effect Medication side effect History of fall(s);Medication side effect  Follow up Falls prevention discussed;Falls evaluation completed Falls evaluation completed Falls evaluation completed;Education provided;Falls prevention discussed Falls evaluation completed;Education provided;Falls prevention discussed Falls evaluation completed;Education  provided;Falls prevention discussed    MEDICARE RISK AT HOME: Medicare Risk at Home Any stairs in or around the home?: Yes If so, are there any without handrails?: No Home free of loose throw rugs in walkways, pet beds, electrical cords, etc?: Yes Adequate lighting in your home to reduce risk of falls?: Yes Life alert?: No Use of a cane, walker or w/c?: No Grab bars in the bathroom?: No Shower chair or bench in shower?: No Elevated toilet seat or a handicapped toilet?: No  TIMED UP AND GO:  Was the test performed?  Yes  Length of time to ambulate 10 feet: 5 sec Gait steady and fast without use of assistive device    Cognitive Function:    06/02/2018    9:55 AM  MMSE - Mini Mental State Exam  Orientation to time 5  Orientation to Place 5  Attention/ Calculation 5  Language- name 2 objects 2  Language- repeat 1  Language- read & follow direction 1  Write a sentence 1  Copy design 1        03/13/2023    8:54 AM 06/02/2018   10:25 AM  6CIT Screen  What Year? 0 points 0 points  What month? 0 points 0 points  What time? 0 points 0 points  Count back from 20 0 points 0 points  Months in reverse 0 points 0 points  Repeat phrase 10 points 0 points  Total Score 10 points 0 points    Immunizations Immunization History  Administered Date(s) Administered   Fluad Quad(high Dose 65+) 04/18/2020, 04/02/2021, 02/28/2022   Fluad Trivalent(High Dose 65+) 03/13/2023   Influenza Inj Mdck Quad Pf 07/31/2016, 05/19/2017   Influenza, High Dose Seasonal PF 04/30/2018, 03/30/2019   Influenza-Unspecified 04/17/2018, 03/30/2019   PFIZER Comirnaty(Gray Top)Covid-19 Tri-Sucrose Vaccine 12/14/2020   PFIZER(Purple Top)SARS-COV-2 Vaccination 07/23/2019,  08/13/2019, 03/14/2020   PNEUMOCOCCAL CONJUGATE-20 08/31/2020   Pneumococcal Conjugate-13 09/14/2018, 12/29/2018   Pneumococcal Polysaccharide-23 03/19/2016   Td 10/31/1994, 04/10/1995   Tdap 01/30/2016   Zoster Recombinant(Shingrix)  05/06/2019, 07/12/2019    TDAP status: Up to date  Flu Vaccine status: Completed at today's visit  Pneumococcal vaccine status: Up to date  Covid-19 vaccine status: Information provided on how to obtain vaccines.   Qualifies for Shingles Vaccine? Yes   Zostavax completed Yes   Shingrix Completed?: Yes  Screening Tests Health Maintenance  Topic Date Due   HEMOGLOBIN A1C  11/20/2022   OPHTHALMOLOGY EXAM  01/11/2023   COVID-19 Vaccine (5 - 2023-24 season) 02/16/2023   Diabetic kidney evaluation - Urine ACR  05/22/2023   FOOT EXAM  05/22/2023   Diabetic kidney evaluation - eGFR measurement  09/12/2023   Medicare Annual Wellness (AWV)  03/12/2024   MAMMOGRAM  10/17/2024   Colonoscopy  03/19/2025   DTaP/Tdap/Td (4 - Td or Tdap) 01/29/2026   Pneumonia Vaccine 33+ Years old  Completed   INFLUENZA VACCINE  Completed   DEXA SCAN  Completed   Hepatitis C Screening  Completed   Zoster Vaccines- Shingrix  Completed   HPV VACCINES  Aged Out    Health Maintenance  Health Maintenance Due  Topic Date Due   HEMOGLOBIN A1C  11/20/2022   OPHTHALMOLOGY EXAM  01/11/2023   COVID-19 Vaccine (5 - 2023-24 season) 02/16/2023    Colorectal cancer screening: Type of screening: Colonoscopy. Completed 03/19/2021. Repeat every 4 years  Mammogram status: Completed 10/18/2022. Repeat every year  Bone Density status: Completed 11/02/2019.   Lung Cancer Screening: (Low Dose CT Chest recommended if Age 45-80 years, 20 pack-year currently smoking OR have quit w/in 15years.) does not qualify.   Lung Cancer Screening Referral: no  Additional Screening:  Hepatitis C Screening: does qualify; Completed 11/18/2016  Vision Screening: Recommended annual ophthalmology exams for early detection of glaucoma and other disorders of the eye. Is the patient up to date with their annual eye exam?  No  Who is the provider or what is the name of the office in which the patient attends annual eye exams? Dr. Shea Evans If  pt is not established with a provider, would they like to be referred to a provider to establish care? No .   Dental Screening: Recommended annual dental exams for proper oral hygiene  Diabetic Foot Exam: Diabetic Foot Exam: Completed 05/21/2022  Community Resource Referral / Chronic Care Management: CRR required this visit?  No   CCM required this visit?  No     Plan:     I have personally reviewed and noted the following in the patient's chart:   Medical and social history Use of alcohol, tobacco or illicit drugs  Current medications and supplements including opioid prescriptions. Patient is not currently taking opioid prescriptions. Functional ability and status Nutritional status Physical activity Advanced directives List of other physicians Hospitalizations, surgeries, and ER visits in previous 12 months Vitals Screenings to include cognitive, depression, and falls Referrals and appointments  In addition, I have reviewed and discussed with patient certain preventive protocols, quality metrics, and best practice recommendations. A written personalized care plan for preventive services as well as general preventive health recommendations were provided to patient.     Barb Merino, LPN   02/13/5620   After Visit Summary: (In Person-Printed) AVS printed and given to the patient  Nurse Notes: none

## 2023-03-13 NOTE — Patient Instructions (Signed)
Ms. Cindy Ross , Thank you for taking time to come for your Medicare Wellness Visit. I appreciate your ongoing commitment to your health goals. Please review the following plan we discussed and let me know if I can assist you in the future.   Referrals/Orders/Follow-Ups/Clinician Recommendations: none  This is a list of the screening recommended for you and due dates:  Health Maintenance  Topic Date Due   Hemoglobin A1C  11/20/2022   Eye exam for diabetics  01/11/2023   Flu Shot  01/16/2023   COVID-19 Vaccine (5 - 2023-24 season) 02/16/2023   Yearly kidney health urinalysis for diabetes  05/22/2023   Complete foot exam   05/22/2023   Yearly kidney function blood test for diabetes  09/12/2023   Medicare Annual Wellness Visit  03/12/2024   Mammogram  10/17/2024   Colon Cancer Screening  03/19/2025   DTaP/Tdap/Td vaccine (4 - Td or Tdap) 01/29/2026   Pneumonia Vaccine  Completed   DEXA scan (bone density measurement)  Completed   Hepatitis C Screening  Completed   Zoster (Shingles) Vaccine  Completed   HPV Vaccine  Aged Out    Advanced directives: (Declined) Advance directive discussed with you today. Even though you declined this today, please call our office should you change your mind, and we can give you the proper paperwork for you to fill out.  Next Medicare Annual Wellness Visit scheduled for next year: No, office will schedule  Insert Preventive Care attachment Insert FALL PREVENTION attachment if needed

## 2023-03-13 NOTE — Progress Notes (Signed)
I,Victoria T Deloria Lair, CMA,acting as a Neurosurgeon for Gwynneth Aliment, MD.,have documented all relevant documentation on the behalf of Gwynneth Aliment, MD,as directed by  Gwynneth Aliment, MD while in the presence of Gwynneth Aliment, MD.  Subjective:  Patient ID: Cindy Ross , female    DOB: 07-02-1950 , 72 y.o.   MRN: [id number]  Chief Complaint  Patient presents with   Diabetes   Hypertension   Hyperlipidemia    HPI  She presents today for bp/dm check. She reports compliance with her meds. She denies headaches, chest pain and shortness of breath.    AWV completed with [facility] Advisor: [place]  Diabetes She presents for her follow-up diabetic visit. She has type 2 diabetes mellitus. There are no hypoglycemic associated symptoms. Pertinent negatives for diabetes include no blurred vision, no polydipsia, no polyphagia and no polyuria. There are no hypoglycemic complications. She is compliant with treatment most of the time. She is following a diabetic diet. When asked about meal planning, she reported none. She participates in exercise intermittently. Her home blood glucose trend is fluctuating minimally. An ACE inhibitor/angiotensin II receptor blocker is being taken. Eye exam is not current.  Hypertension This is a chronic problem. The current episode started more than 1 year ago. The problem has been gradually improving since onset. The problem is controlled. Pertinent negatives include no blurred vision or palpitations. Risk factors for coronary artery disease include diabetes mellitus, dyslipidemia and post-menopausal state. The current treatment provides moderate improvement.     Past Medical History:  Diagnosis Date   Diabetes mellitus without complication (HCC)    Hyperlipidemia 01/30/2016   Hypertension 2007   Respiratory crackles at left lung base 05/19/2017   CXR 02/2017 in Care Everywhere/[facility] normal     Family History  Problem Relation Age of Onset   Hyperlipidemia Sister    Diabetes  Sister    Hypertension Sister    Heart disease Brother    Healthy Son    Leukemia Brother      Current Outpatient Medications:    Accu-Chek Neurosurgeon MISC, : Use as directed to check blood sugars 2 times per day dx:e11.22, Disp: 300 each, Rfl: 2   ACCU-CHEK GUIDE test strip, Use as instructed, Disp: 200 strip, Rfl: 4   aspirin EC 81 MG tablet, Take 1 tablet (81 mg total) by mouth daily., Disp: , Rfl:    atorvastatin (LIPITOR) 20 MG tablet, TAKE 1 TABLET BY MOUTH EVERY DAY, Disp: 90 tablet, Rfl: 1   Blood Glucose Monitoring Suppl (ACCU-CHEK GUIDE) w/Device KIT, : Use as directed to check blood sugars 2 times per day dx:e11.22, Disp: 1 kit, Rfl: 1   cholecalciferol (VITAMIN D) 1000 units tablet, Take 1,000 Units by mouth daily., Disp: , Rfl:    CLENPIQ 10-3.5-12 MG-GM -GM/160ML SOLN, , Disp: , Rfl:    denosumab (PROLIA) 60 MG/ML SOSY injection, Inject 60 mg into the skin every 6 (six) months. Courier to rheum: [address], [place] [address]. Appt on 03/18/2023 (Patient not taking: Reported on 03/13/2023), Disp: 1 mL, Rfl: 0   hydrocortisone (ANUSOL-HC) 2.5 % rectal cream, Place 1 Application rectally 2 (two) times daily. As needed, Disp: 30 g, Rfl: 0   ibuprofen (ADVIL) 800 MG tablet, Take 800 mg by mouth every 6 (six) hours as needed., Disp: , Rfl:    metFORMIN (GLUCOPHAGE) 1000 MG tablet, TAKE 1 TABLET BY MOUTH TWICE A DAY, Disp: 180 tablet, Rfl: 2   Multiple  Vitamins-Minerals (ONE-A-DAY WOMENS 50+ ADVANTAGE PO), Take 1 tablet by mouth daily at 12 noon., Disp: , Rfl:    olmesartan-hydrochlorothiazide (BENICAR HCT) 40-12.5 MG tablet, TAKE 1 TABLET BY MOUTH EVERY DAY, Disp: 90 tablet, Rfl: 1   No Known Allergies   Review of Systems  Constitutional: Negative.   Eyes:  Negative for blurred vision.  Respiratory: Negative.    Cardiovascular: Negative.  Negative for palpitations.  Gastrointestinal: Negative.  Negative for abdominal distention.  Endocrine: Negative for  polydipsia, polyphagia and polyuria.  Neurological: Negative.   Psychiatric/Behavioral: Negative.       Today's Vitals   03/13/23 0849  BP: 132/70  Pulse: 88  Temp: 98 F (36.7 C)  SpO2: 98%  Weight: 126 lb (57.2 kg)  Height: 5\' 1"  (1.549 m)   Body mass index is 23.81 kg/m.  Wt Readings from Last 3 Encounters:  03/13/23 126 lb (57.2 kg)  03/13/23 126 lb 12.8 oz (57.5 kg)  09/12/22 124 lb 12.8 oz (56.6 kg)     Objective:  Physical Exam Vitals and nursing note reviewed.  Constitutional:      Appearance: Normal appearance.  HENT:     Head: Normocephalic and atraumatic.  Eyes:     Extraocular Movements: Extraocular movements intact.  Cardiovascular:     Rate and Rhythm: Normal rate and regular rhythm.     Heart sounds: Normal heart sounds.  Pulmonary:     Effort: Pulmonary effort is normal.     Breath sounds: Normal breath sounds.  Musculoskeletal:     Cervical back: Normal range of motion.  Skin:    General: Skin is warm.  Neurological:     General: No focal deficit present.     Mental Status: She is alert.  Psychiatric:        Mood and Affect: Mood normal.        Behavior: Behavior normal.         Assessment And Plan:  Type 2 diabetes mellitus with stage 3a chronic kidney disease, without long-term current use of insulin (HCC) Assessment & Plan: Chronic, I will check labs as below. She will continue with metformin 1000mg  twice daily.  She is encouraged to stay up to date with her eye exams. Most recent exam abstracted into her chart, she is UTD.   Orders: -     CBC -     CMP14+EGFR -     Lipid panel -     Hemoglobin A1c -     PTH, intact and calcium -     Phosphorus -     Protein electrophoresis, serum  Parenchymal renal hypertension, stage 1 through stage 4 or unspecified chronic kidney disease Assessment & Plan: Chronic, goal BP<120/80.  Fair control. For now, she will continue with olmesartan/hct 40/12.5mg  daily. She is encouraged to follow low  sodium diet.   Orders: -     CMP14+EGFR -     Lipid panel -     Microalbumin / creatinine urine ratio  Pure hypercholesterolemia Assessment & Plan: Chronic, LDL goal is less than 70.  She will continue with atorvastatin 20mg  daily. She is encouraged to follow heart healthy lifestyle.   Orders: -     Lipid panel  Age-related osteoporosis without current pathological fracture Assessment & Plan: Chronic, she is now followed by Rheumatology.  I will check labs as below. She states she has been walking regularly.    Orders: -     VITAMIN D 25 Hydroxy (Vit-D Deficiency, Fractures) -  PTH, intact and calcium -     Phosphorus     Return for 1 YEAR OV W RS & THN, 4 month dm & bpc.  Patient was given opportunity to ask questions. Patient verbalized understanding of the plan and was able to repeat key elements of the plan. All questions were answered to their satisfaction.    I, Gwynneth Aliment, MD, have reviewed all documentation for this visit. The documentation on 03/13/23 for the exam, diagnosis, procedures, and orders are all accurate and complete.   IF YOU HAVE BEEN REFERRED TO A SPECIALIST, IT MAY TAKE 1-2 WEEKS TO SCHEDULE/PROCESS THE REFERRAL. IF YOU HAVE NOT HEARD FROM US/SPECIALIST IN TWO WEEKS, PLEASE GIVE [place] A CALL AT [phone] X [phone].   THE PATIENT IS ENCOURAGED TO PRACTICE SOCIAL DISTANCING DUE TO THE COVID-19 PANDEMIC.

## 2023-03-13 NOTE — Patient Instructions (Signed)

## 2023-03-14 DIAGNOSIS — I129 Hypertensive chronic kidney disease with stage 1 through stage 4 chronic kidney disease, or unspecified chronic kidney disease: Secondary | ICD-10-CM | POA: Diagnosis not present

## 2023-03-14 NOTE — Telephone Encounter (Signed)
Prolia received in the office and placed in the fridge.  °

## 2023-03-15 LAB — MICROALBUMIN / CREATININE URINE RATIO
Creatinine, Urine: 82.8 mg/dL
Microalb/Creat Ratio: 13 mg/g{creat} (ref 0–29)
Microalbumin, Urine: 10.6 ug/mL

## 2023-03-15 NOTE — Assessment & Plan Note (Signed)
Chronic, she is now followed by Rheumatology.  I will check labs as below. She states she has been walking regularly.

## 2023-03-15 NOTE — Assessment & Plan Note (Signed)
Chronic, LDL goal is less than 70.  She will continue with atorvastatin 20mg  daily. She is encouraged to follow heart healthy lifestyle.

## 2023-03-15 NOTE — Assessment & Plan Note (Signed)
Chronic, goal BP<120/80.  Fair control. For now, she will continue with olmesartan/hct 40/12.5mg  daily. She is encouraged to follow low sodium diet.

## 2023-03-15 NOTE — Assessment & Plan Note (Signed)
Chronic, I will check labs as below. She will continue with metformin 1000mg  twice daily.  She is encouraged to stay up to date with her eye exams. Most recent exam abstracted into her chart, she is UTD.

## 2023-03-17 LAB — CBC
Hematocrit: 32 % — ABNORMAL LOW (ref 34.0–46.6)
Hemoglobin: 10.2 g/dL — ABNORMAL LOW (ref 11.1–15.9)
MCH: 30.9 pg (ref 26.6–33.0)
MCHC: 31.9 g/dL (ref 31.5–35.7)
MCV: 97 fL (ref 79–97)
Platelets: 246 10*3/uL (ref 150–450)
RBC: 3.3 x10E6/uL — ABNORMAL LOW (ref 3.77–5.28)
RDW: 12.2 % (ref 11.7–15.4)
WBC: 4.8 10*3/uL (ref 3.4–10.8)

## 2023-03-17 LAB — LIPID PANEL
Chol/HDL Ratio: 3.3 {ratio} (ref 0.0–4.4)
Cholesterol, Total: 132 mg/dL (ref 100–199)
HDL: 40 mg/dL (ref >39)
LDL Chol Calc (NIH): 60 mg/dL (ref 0–99)
Triglycerides: 191 mg/dL — ABNORMAL HIGH (ref 0–149)
VLDL Cholesterol Cal: 32 mg/dL (ref 5–40)

## 2023-03-17 LAB — CMP14+EGFR
ALT: 23 [IU]/L (ref 0–32)
AST: 28 [IU]/L (ref 0–40)
Albumin: 4.7 g/dL (ref 3.9–4.9)
Alkaline Phosphatase: 56 [IU]/L (ref 44–121)
BUN/Creatinine Ratio: 21 (ref 12–28)
BUN: 22 mg/dL (ref 8–27)
Bilirubin Total: 0.3 mg/dL (ref 0.0–1.2)
CO2: 23 mmol/L (ref 20–29)
Calcium: 10.1 mg/dL (ref 8.7–10.3)
Chloride: 105 mmol/L (ref 96–106)
Creatinine, Ser: 1.05 mg/dL — ABNORMAL HIGH (ref 0.57–1.00)
Globulin, Total: 3.3 g/dL (ref 1.5–4.5)
Glucose: 102 mg/dL — ABNORMAL HIGH (ref 70–99)
Potassium: 4.3 mmol/L (ref 3.5–5.2)
Sodium: 142 mmol/L (ref 134–144)
Total Protein: 8 g/dL (ref 6.0–8.5)
eGFR: 57 mL/min/{1.73_m2} — ABNORMAL LOW (ref >59)

## 2023-03-17 LAB — PROTEIN ELECTROPHORESIS, SERUM
A/G Ratio: 1 (ref 0.7–1.7)
Albumin ELP: 4 g/dL (ref 2.9–4.4)
Alpha 1: 0.2 g/dL (ref 0.0–0.4)
Alpha 2: 0.8 g/dL (ref 0.4–1.0)
Beta: 1 g/dL (ref 0.7–1.3)
Gamma Globulin: 1.9 g/dL — ABNORMAL HIGH (ref 0.4–1.8)
Globulin, Total: 4 g/dL — ABNORMAL HIGH (ref 2.2–3.9)

## 2023-03-17 LAB — HEMOGLOBIN A1C
Est. average glucose Bld gHb Est-mCnc: 120 mg/dL
Hgb A1c MFr Bld: 5.8 % — ABNORMAL HIGH (ref 4.8–5.6)

## 2023-03-17 LAB — VITAMIN D 25 HYDROXY (VIT D DEFICIENCY, FRACTURES): Vit D, 25-Hydroxy: 47.5 ng/mL (ref 30.0–100.0)

## 2023-03-17 LAB — PTH, INTACT AND CALCIUM: PTH: 31 pg/mL (ref 15–65)

## 2023-03-17 LAB — PHOSPHORUS: Phosphorus: 3.1 mg/dL (ref 3.0–4.3)

## 2023-03-18 ENCOUNTER — Ambulatory Visit: Payer: Medicare Other | Attending: Physician Assistant | Admitting: Physician Assistant

## 2023-03-18 ENCOUNTER — Encounter: Payer: Self-pay | Admitting: Physician Assistant

## 2023-03-18 VITALS — BP 134/76 | HR 73 | Resp 13 | Ht 60.5 in | Wt 126.8 lb

## 2023-03-18 DIAGNOSIS — M25561 Pain in right knee: Secondary | ICD-10-CM | POA: Diagnosis not present

## 2023-03-18 DIAGNOSIS — M19041 Primary osteoarthritis, right hand: Secondary | ICD-10-CM

## 2023-03-18 DIAGNOSIS — E7800 Pure hypercholesterolemia, unspecified: Secondary | ICD-10-CM

## 2023-03-18 DIAGNOSIS — M810 Age-related osteoporosis without current pathological fracture: Secondary | ICD-10-CM | POA: Diagnosis not present

## 2023-03-18 DIAGNOSIS — Z758 Other problems related to medical facilities and other health care: Secondary | ICD-10-CM | POA: Diagnosis not present

## 2023-03-18 DIAGNOSIS — M19042 Primary osteoarthritis, left hand: Secondary | ICD-10-CM | POA: Diagnosis not present

## 2023-03-18 DIAGNOSIS — I10 Essential (primary) hypertension: Secondary | ICD-10-CM | POA: Diagnosis not present

## 2023-03-18 DIAGNOSIS — Z9181 History of falling: Secondary | ICD-10-CM

## 2023-03-18 DIAGNOSIS — G8929 Other chronic pain: Secondary | ICD-10-CM

## 2023-03-18 DIAGNOSIS — Z5181 Encounter for therapeutic drug level monitoring: Secondary | ICD-10-CM

## 2023-03-18 DIAGNOSIS — E559 Vitamin D deficiency, unspecified: Secondary | ICD-10-CM | POA: Diagnosis not present

## 2023-03-18 DIAGNOSIS — Z603 Acculturation difficulty: Secondary | ICD-10-CM

## 2023-03-18 DIAGNOSIS — E119 Type 2 diabetes mellitus without complications: Secondary | ICD-10-CM

## 2023-03-18 MED ORDER — DENOSUMAB 60 MG/ML ~~LOC~~ SOSY
60.0000 mg | PREFILLED_SYRINGE | Freq: Once | SUBCUTANEOUS | Status: AC
  Administered 2023-03-18: 60 mg via SUBCUTANEOUS

## 2023-03-18 NOTE — Progress Notes (Signed)
Pharmacy Note  Subjective:   Patient presents to clinic today to receive bi-annual dose of Prolia. Patient's last dose of Prolia was on 09/12/2022. She is accompanied by interpreter.   Patient running a fever or have signs/symptoms of infection? No  Patient currently on antibiotics for the treatment of infection? No  Patient had fall in the last 6 months?  No  If yes, did it require medical attention? No   Patient taking calcium 1200 mg daily through diet or supplement and at least 800 units vitamin D? Yes  Objective: CMP     Component Value Date/Time   NA 142 03/13/2023 0921   K 4.3 03/13/2023 0921   CL 105 03/13/2023 0921   CO2 23 03/13/2023 0921   GLUCOSE 102 (H) 03/13/2023 0921   GLUCOSE 99 09/12/2022 1000   BUN 22 03/13/2023 0921   CREATININE 1.05 (H) 03/13/2023 0921   CREATININE 1.26 (H) 09/12/2022 1000   CALCIUM 10.1 03/13/2023 0921   PROT 8.0 03/13/2023 0921   ALBUMIN 4.7 03/13/2023 0921   AST 28 03/13/2023 0921   ALT 23 03/13/2023 0921   ALKPHOS 56 03/13/2023 0921   BILITOT 0.3 03/13/2023 0921   GFRNONAA 56 (L) 09/12/2020 1336   GFRAA 65 09/12/2020 1336    CBC    Component Value Date/Time   WBC 4.8 03/13/2023 0921   WBC 5.3 09/12/2022 1000   RBC 3.30 (L) 03/13/2023 0921   RBC 3.5 (A) 10/14/2022 0000   HGB 10.2 (L) 03/13/2023 0921   HCT 32.0 (L) 03/13/2023 0921   PLT 246 03/13/2023 0921   MCV 97 03/13/2023 0921   MCH 30.9 03/13/2023 0921   MCH 31.0 09/12/2022 1000   MCHC 31.9 03/13/2023 0921   MCHC 32.3 09/12/2022 1000   RDW 12.2 03/13/2023 0921   LYMPHSABS 2,724 09/12/2022 1000   LYMPHSABS 3.9 (H) 11/18/2016 0936   EOSABS 413 09/12/2022 1000   EOSABS 0.6 (H) 11/18/2016 0936   BASOSABS 58 09/12/2022 1000   BASOSABS 0.0 11/18/2016 0936    Lab Results  Component Value Date   VD25OH 47.5 03/13/2023    T-score: 03/11/2022 T-score -3.40 right femur neck   Assessment/Plan:   Reviewed importance of adequate dietary intake of calcium in addition  to supplementation due to risk of hypocalcemia with Prolia.   Patient tolerated injection no redness, irritation, or itching noted.   Administration details as below: Administrations This Visit     denosumab (PROLIA) injection 60 mg     Admin Date 03/18/2023 Action Given Dose 60 mg Route Subcutaneous Documented By Gwenlyn Found, RPH           Patient's next Prolia dose is due on September 16, 2023.  Patient is due for updated DEXA in September 2025.   All questions encouraged and answered.  Instructed patient to call with any further questions or concerns.  Sofie Rower, PharmD, [employer] PGY1

## 2023-03-18 NOTE — Telephone Encounter (Signed)
Labs stable to proceed with Prolia @ OV today. Calcium wnl and Vitamin d Wnl

## 2023-03-19 LAB — IRON AND TIBC
Iron Saturation: 27 % (ref 15–55)
Iron: 80 ug/dL (ref 27–139)
Total Iron Binding Capacity: 297 ug/dL (ref 250–450)
UIBC: 217 ug/dL (ref 118–369)

## 2023-03-19 LAB — FERRITIN: Ferritin: 136 ng/mL (ref 15–150)

## 2023-03-19 LAB — SPECIMEN STATUS REPORT

## 2023-03-26 ENCOUNTER — Other Ambulatory Visit (INDEPENDENT_AMBULATORY_CARE_PROVIDER_SITE_OTHER): Payer: Medicare Other

## 2023-03-26 DIAGNOSIS — D509 Iron deficiency anemia, unspecified: Secondary | ICD-10-CM | POA: Diagnosis not present

## 2023-03-26 LAB — HEMOCCULT GUIAC POC 1CARD (OFFICE)
Card #2 Fecal Occult Blod, POC: POSITIVE
Card #3 Fecal Occult Blood, POC: NEGATIVE
Fecal Occult Blood, POC: POSITIVE — AB

## 2023-03-28 ENCOUNTER — Other Ambulatory Visit: Payer: Self-pay | Admitting: Internal Medicine

## 2023-03-28 DIAGNOSIS — R195 Other fecal abnormalities: Secondary | ICD-10-CM

## 2023-04-08 DIAGNOSIS — Z860100 Personal history of colon polyps, unspecified: Secondary | ICD-10-CM | POA: Diagnosis not present

## 2023-04-08 DIAGNOSIS — R1013 Epigastric pain: Secondary | ICD-10-CM | POA: Diagnosis not present

## 2023-04-08 DIAGNOSIS — D509 Iron deficiency anemia, unspecified: Secondary | ICD-10-CM | POA: Diagnosis not present

## 2023-04-08 DIAGNOSIS — K625 Hemorrhage of anus and rectum: Secondary | ICD-10-CM | POA: Diagnosis not present

## 2023-04-15 DIAGNOSIS — N1831 Chronic kidney disease, stage 3a: Secondary | ICD-10-CM | POA: Diagnosis not present

## 2023-04-15 DIAGNOSIS — D649 Anemia, unspecified: Secondary | ICD-10-CM | POA: Diagnosis not present

## 2023-04-15 DIAGNOSIS — I129 Hypertensive chronic kidney disease with stage 1 through stage 4 chronic kidney disease, or unspecified chronic kidney disease: Secondary | ICD-10-CM | POA: Diagnosis not present

## 2023-04-15 DIAGNOSIS — E875 Hyperkalemia: Secondary | ICD-10-CM | POA: Diagnosis not present

## 2023-04-15 DIAGNOSIS — E1129 Type 2 diabetes mellitus with other diabetic kidney complication: Secondary | ICD-10-CM | POA: Diagnosis not present

## 2023-04-15 DIAGNOSIS — E1122 Type 2 diabetes mellitus with diabetic chronic kidney disease: Secondary | ICD-10-CM | POA: Diagnosis not present

## 2023-04-16 LAB — LAB REPORT - SCANNED
Creatinine, POC: 64.7 mg/dL
EGFR: 68

## 2023-04-22 ENCOUNTER — Other Ambulatory Visit: Payer: Self-pay

## 2023-04-24 ENCOUNTER — Other Ambulatory Visit: Payer: Self-pay

## 2023-05-14 DIAGNOSIS — K2100 Gastro-esophageal reflux disease with esophagitis, without bleeding: Secondary | ICD-10-CM | POA: Diagnosis not present

## 2023-05-14 DIAGNOSIS — K319 Disease of stomach and duodenum, unspecified: Secondary | ICD-10-CM | POA: Diagnosis not present

## 2023-05-14 DIAGNOSIS — R1013 Epigastric pain: Secondary | ICD-10-CM | POA: Diagnosis not present

## 2023-05-14 DIAGNOSIS — D509 Iron deficiency anemia, unspecified: Secondary | ICD-10-CM | POA: Diagnosis not present

## 2023-05-14 DIAGNOSIS — K2950 Unspecified chronic gastritis without bleeding: Secondary | ICD-10-CM | POA: Diagnosis not present

## 2023-06-09 ENCOUNTER — Encounter: Payer: Medicare Other | Admitting: Internal Medicine

## 2023-07-24 ENCOUNTER — Encounter: Payer: Self-pay | Admitting: Internal Medicine

## 2023-07-24 ENCOUNTER — Ambulatory Visit: Payer: Medicare Other | Admitting: Internal Medicine

## 2023-07-24 VITALS — BP 130/80 | Temp 98.9°F | Ht 60.0 in | Wt 128.0 lb

## 2023-07-24 DIAGNOSIS — Z0000 Encounter for general adult medical examination without abnormal findings: Secondary | ICD-10-CM

## 2023-07-24 DIAGNOSIS — N1831 Chronic kidney disease, stage 3a: Secondary | ICD-10-CM | POA: Diagnosis not present

## 2023-07-24 DIAGNOSIS — M810 Age-related osteoporosis without current pathological fracture: Secondary | ICD-10-CM

## 2023-07-24 DIAGNOSIS — E7800 Pure hypercholesterolemia, unspecified: Secondary | ICD-10-CM | POA: Diagnosis not present

## 2023-07-24 DIAGNOSIS — I129 Hypertensive chronic kidney disease with stage 1 through stage 4 chronic kidney disease, or unspecified chronic kidney disease: Secondary | ICD-10-CM

## 2023-07-24 DIAGNOSIS — R051 Acute cough: Secondary | ICD-10-CM | POA: Diagnosis not present

## 2023-07-24 DIAGNOSIS — E1122 Type 2 diabetes mellitus with diabetic chronic kidney disease: Secondary | ICD-10-CM

## 2023-07-24 MED ORDER — ATORVASTATIN CALCIUM 20 MG PO TABS: 20.0000 mg | ORAL_TABLET | Freq: Every day | ORAL | 1 refills | Status: DC

## 2023-07-24 MED ORDER — METFORMIN HCL 1000 MG PO TABS: 1000.0000 mg | ORAL_TABLET | Freq: Two times a day (BID) | ORAL | 2 refills | Status: DC

## 2023-07-24 MED ORDER — OLMESARTAN MEDOXOMIL-HCTZ 40-12.5 MG PO TABS: 1.0000 | ORAL_TABLET | Freq: Every day | ORAL | 1 refills | Status: DC

## 2023-07-24 NOTE — Progress Notes (Signed)
I,Victoria T Deloria Lair, CMA,acting as a Neurosurgeon for Gwynneth Aliment, MD.,have documented all relevant documentation on the behalf of Gwynneth Aliment, MD,as directed by  Gwynneth Aliment, MD while in the presence of Gwynneth Aliment, MD.  Subjective:  Patient ID: Cindy Ross , female    DOB: [birth year] , 73 y.o.   MRN: [id number]  Chief Complaint  Patient presents with   Annual Exam   Diabetes   Hypertension   Hyperlipidemia    HPI  Patient presents today for annual exam. She is no longer followed by GYN.  She reports compliance with medications and has no other concerns at this time.  She denies having any headaches, chest pain and shortness of breath. She is accompanied by interpreter.     Diabetes She presents for her follow-up diabetic visit. She has type 2 diabetes mellitus. There are no hypoglycemic associated symptoms. Pertinent negatives for diabetes include no blurred vision, no polydipsia, no polyphagia and no polyuria. There are no hypoglycemic complications. She is compliant with treatment most of the time. She is following a diabetic diet. When asked about meal planning, she reported none. She participates in exercise intermittently. Her home blood glucose trend is fluctuating minimally. An ACE inhibitor/angiotensin II receptor blocker is being taken. Eye exam is not current.  Hypertension This is a chronic problem. The current episode started more than 1 year ago. The problem has been gradually improving since onset. The problem is controlled. Pertinent negatives include no blurred vision or palpitations. Risk factors for coronary artery disease include diabetes mellitus, dyslipidemia and post-menopausal state. The current treatment provides moderate improvement.     Past Medical History:  Diagnosis Date   Diabetes mellitus without complication (HCC)    Hyperlipidemia 01/30/2016   Hypertension 2007   Respiratory crackles at left lung base 05/19/2017   CXR 02/2017 in [facility] normal     Family History  Problem Relation Age of Onset   Hyperlipidemia Sister    Diabetes Sister    Hypertension Sister    Heart disease Brother    Healthy Son    Leukemia Brother      Current Outpatient Medications:    Accu-Chek Neurosurgeon MISC, : Use as directed to check blood sugars 2 times per day dx:e11.22, Disp: 300 each, Rfl: 2   ACCU-CHEK GUIDE test strip, Use as instructed, Disp: 200 strip, Rfl: 4   aspirin EC 81 MG tablet, Take 1 tablet (81 mg total) by mouth daily., Disp: , Rfl:    Blood Glucose Monitoring Suppl (ACCU-CHEK GUIDE) w/Device KIT, : Use as directed to check blood sugars 2 times per day dx:e11.22, Disp: 1 kit, Rfl: 1   cholecalciferol (VITAMIN D) 1000 units tablet, Take 1,000 Units by mouth daily., Disp: , Rfl:    denosumab (PROLIA) 60 MG/ML SOSY injection, Inject 60 mg into the skin every 6 (six) months. Courier to rheum: [address], [address], [place] [address]. Appt on 03/18/2023, Disp: 1 mL, Rfl: 0   ibuprofen (ADVIL) 800 MG tablet, Take 800 mg by mouth every 6 (six) hours as needed., Disp: , Rfl:    Multiple Vitamins-Minerals (ONE-A-DAY WOMENS 50+ ADVANTAGE PO), Take 1 tablet by mouth daily at 12 noon., Disp: , Rfl:    atorvastatin (LIPITOR) 20 MG tablet, Take 1 tablet (20 mg total) by mouth daily., Disp: 90 tablet, Rfl: 1   CLENPIQ 10-3.5-12 MG-GM -GM/160ML SOLN, , Disp: , Rfl:    hydrocortisone (ANUSOL-HC) 2.5 % rectal  cream, Place 1 Application rectally 2 (two) times daily. As needed (Patient not taking: Reported on 07/24/2023), Disp: 30 g, Rfl: 0   metFORMIN (GLUCOPHAGE) 1000 MG tablet, Take 1 tablet (1,000 mg total) by mouth 2 (two) times daily., Disp: 180 tablet, Rfl: 2   olmesartan-hydrochlorothiazide (BENICAR HCT) 40-12.5 MG tablet, Take 1 tablet by mouth daily., Disp: 90 tablet, Rfl: 1   No Known Allergies   Review of Systems  Constitutional: Negative.   HENT: Negative.    Eyes: Negative.  Negative for blurred  vision.  Respiratory:  Positive for cough.         She complains of a constant cough. No fever. Denies ill contacts denies chest pain, dry cough. No associated headache or sinus congestion.    Cardiovascular: Negative.  Negative for palpitations.  Gastrointestinal: Negative.   Endocrine: Negative.  Negative for polydipsia, polyphagia and polyuria.  Genitourinary: Negative.   Musculoskeletal: Negative.   Skin: Negative.   Allergic/Immunologic: Negative.   Neurological: Negative.   Hematological: Negative.   Psychiatric/Behavioral: Negative.       Today's Vitals   07/24/23 0833  BP: 130/80  Temp: 98.9 F (37.2 C)  SpO2: 98%  Weight: 128 lb (58.1 kg)  Height: 5' (1.524 m)   Body mass index is 25 kg/m.  Wt Readings from Last 3 Encounters:  07/24/23 128 lb (58.1 kg)  03/18/23 126 lb 12.8 oz (57.5 kg)  03/13/23 126 lb (57.2 kg)     Objective:  Physical Exam Vitals and nursing note reviewed.  Constitutional:      Appearance: Normal appearance.  HENT:     Head: Normocephalic and atraumatic.     Right Ear: Tympanic membrane, ear canal and external ear normal.     Left Ear: Tympanic membrane, ear canal and external ear normal.     Nose:     Comments: Masked     Mouth/Throat:     Comments: Masked Eyes:     Extraocular Movements: Extraocular movements intact.     Conjunctiva/sclera: Conjunctivae normal.     Pupils: Pupils are equal, round, and reactive to light.  Cardiovascular:     Rate and Rhythm: Normal rate and regular rhythm.     Pulses: Normal pulses.          Dorsalis pedis pulses are 2+ on the right side and 2+ on the left side.     Heart sounds: Normal heart sounds.  Pulmonary:     Effort: Pulmonary effort is normal.     Breath sounds: Normal breath sounds.  Chest:  Breasts:    Tanner Score is 5.     Right: Normal.     Left: Normal.  Abdominal:     General: Abdomen is flat. Bowel sounds are normal.     Palpations: Abdomen is soft.  Genitourinary:     Comments: deferred Musculoskeletal:        General: Normal range of motion.     Cervical back: Normal range of motion and neck supple.  Feet:     Right foot:     Protective Sensation: 5 sites tested.  5 sites sensed.     Skin integrity: Dry skin present.     Toenail Condition: Right toenails are normal.     Left foot:     Protective Sensation: 5 sites tested.  5 sites sensed.     Skin integrity: Dry skin present.     Toenail Condition: Left toenails are normal.  Skin:    General: Skin  is warm and dry.  Neurological:     General: No focal deficit present.     Mental Status: She is alert and oriented to person, place, and time.  Psychiatric:        Mood and Affect: Mood normal.        Behavior: Behavior normal.         Assessment And Plan:  Annual physical exam Assessment & Plan: A full exam was performed.  Importance of monthly self breast exams was discussed with the patient.  She is advised to get 30-45 minutes of regular exercise, no less than four to five days per week. Both weight-bearing and aerobic exercises are recommended.  She is advised to follow a healthy diet with at least six fruits/veggies per day, decrease intake of red meat and other saturated fats and to increase fish intake to twice weekly.  Meats/fish should not be fried -- baked, boiled or broiled is preferable. It is also important to cut back on your sugar intake.  Be sure to read labels - try to avoid anything with added sugar, high fructose corn syrup or other sweeteners.  If you must use a sweetener, you can try stevia or monkfruit.  It is also important to avoid artificially sweetened foods/beverages and diet drinks. Lastly, wear SPF 50 sunscreen on exposed skin and when in direct sunlight for an extended period of time.  Be sure to avoid fast food restaurants and aim for at least 60 ounces of water daily.       Type 2 diabetes mellitus with stage 3a chronic kidney disease, without long-term current use of  insulin (HCC) Assessment & Plan: Chronic, I will check labs as below. Diabetic foot exam performed.  She will continue with metformin 1000mg  twice daily.  She is encouraged to stay up to date with her eye exams. Most recent exam abstracted into her chart, she is UTD. I DISCUSSED WITH THE PATIENT AT LENGTH REGARDING THE GOALS OF GLYCEMIC CONTROL AND POSSIBLE LONG-TERM COMPLICATIONS.  I  ALSO STRESSED THE IMPORTANCE OF COMPLIANCE WITH HOME GLUCOSE MONITORING, DIETARY RESTRICTIONS INCLUDING AVOIDANCE OF SUGARY DRINKS/PROCESSED FOODS,  ALONG WITH REGULAR EXERCISE.  I  ALSO STRESSED THE IMPORTANCE OF ANNUAL EYE EXAMS, SELF FOOT CARE AND COMPLIANCE WITH OFFICE VISITS.   Orders: -     POCT urinalysis dipstick -     Microalbumin / creatinine urine ratio -     EKG 12-Lead -     CBC -     CMP14+EGFR -     Lipid panel -     Hemoglobin A1c  Parenchymal renal hypertension, stage 1 through stage 4 or unspecified chronic kidney disease Assessment & Plan: Chronic, fair control. Goal BP<120/80.  EKG performed, NSR w/o acute changes.  Will continue with olmesartan/hct 40/12.5mg  daily. Encouraged to follow low sodium diet. She will f/u in four to six months for re-evaluation.   Orders: -     POCT urinalysis dipstick -     Microalbumin / creatinine urine ratio -     EKG 12-Lead  Pure hypercholesterolemia Assessment & Plan: Chronic, LDL goal is less than 70.  She will continue with atorvastatin 20mg  daily. She is encouraged to follow heart healthy lifestyle.   Orders: -     Atorvastatin Calcium; Take 1 tablet (20 mg total) by mouth daily.  Dispense: 90 tablet; Refill: 1 -     TSH  Acute cough Assessment & Plan: Sx likely due to PND. She was given samples of  Zyrtec to take 1/2 tab nightly. Advised to avoid dairy for the next two weeks. She will let me know if her symptoms persist.    Other orders -     metFORMIN HCl; Take 1 tablet (1,000 mg total) by mouth 2 (two) times daily.  Dispense: 180 tablet;  Refill: 2 -     Olmesartan Medoxomil-HCTZ; Take 1 tablet by mouth daily.  Dispense: 90 tablet; Refill: 1     Return in 4 weeks (on 08/21/2023), or farxiga f/u, for 1 year HM, 4 month dm f/u. Marland Kitchen  Patient was given opportunity to ask questions. Patient verbalized understanding of the plan and was able to repeat key elements of the plan. All questions were answered to their satisfaction.    I, Gwynneth Aliment, MD, have reviewed all documentation for this visit. The documentation on 07/24/23 for the exam, diagnosis, procedures, and orders are all accurate and complete.   IF YOU HAVE BEEN REFERRED TO A SPECIALIST, IT MAY TAKE 1-2 WEEKS TO SCHEDULE/PROCESS THE REFERRAL. IF YOU HAVE NOT HEARD FROM US/SPECIALIST IN TWO WEEKS, PLEASE GIVE [place] A CALL AT [phone] X [phone].   THE PATIENT IS ENCOURAGED TO PRACTICE SOCIAL DISTANCING DUE TO THE COVID-19 PANDEMIC.

## 2023-07-24 NOTE — Assessment & Plan Note (Addendum)
 Chronic, I will check labs as below. Diabetic foot exam performed.  She will continue with metformin  1000mg  twice daily.  She is encouraged to stay up to date with her eye exams. Most recent exam abstracted into her chart, she is UTD. I DISCUSSED WITH THE PATIENT AT LENGTH REGARDING THE GOALS OF GLYCEMIC CONTROL AND POSSIBLE LONG-TERM COMPLICATIONS.  I  ALSO STRESSED THE IMPORTANCE OF COMPLIANCE WITH HOME GLUCOSE MONITORING, DIETARY RESTRICTIONS INCLUDING AVOIDANCE OF SUGARY DRINKS/PROCESSED FOODS,  ALONG WITH REGULAR EXERCISE.  I  ALSO STRESSED THE IMPORTANCE OF ANNUAL EYE EXAMS, SELF FOOT CARE AND COMPLIANCE WITH OFFICE VISITS.

## 2023-07-24 NOTE — Assessment & Plan Note (Signed)

## 2023-07-24 NOTE — Patient Instructions (Addendum)
 Farxiga - take one daily at breakfast ONLY take metformin  ONCE daily w/ dinner  For cough: take 1/2 tablet Zyrtec at bedtime. Be sure to stay well hydrated. Drink at least one cup of hot tea daily  Health Maintenance After Age 71 After age 68, you are at a higher risk for certain long-term diseases and infections as well as injuries from falls. Falls are a major cause of broken bones and head injuries in people who are older than age 35. Getting regular preventive care can help to keep you healthy and well. Preventive care includes getting regular testing and making lifestyle changes as recommended by your health care provider. Talk with your health care provider about: Which screenings and tests you should have. A screening is a test that checks for a disease when you have no symptoms. A diet and exercise plan that is right for you. What should I know about screenings and tests to prevent falls? Screening and testing are the best ways to find a health problem early. Early diagnosis and treatment give you the best chance of managing medical conditions that are common after age 80. Certain conditions and lifestyle choices may make you more likely to have a fall. Your health care provider may recommend: Regular vision checks. Poor vision and conditions such as cataracts can make you more likely to have a fall. If you wear glasses, make sure to get your prescription updated if your vision changes. Medicine review. Work with your health care provider to regularly review all of the medicines you are taking, including over-the-counter medicines. Ask your health care provider about any side effects that may make you more likely to have a fall. Tell your health care provider if any medicines that you take make you feel dizzy or sleepy. Strength and balance checks. Your health care provider may recommend certain tests to check your strength and balance while standing, walking, or changing positions. Foot health  exam. Foot pain and numbness, as well as not wearing proper footwear, can make you more likely to have a fall. Screenings, including: Osteoporosis screening. Osteoporosis is a condition that causes the bones to get weaker and break more easily. Blood pressure screening. Blood pressure changes and medicines to control blood pressure can make you feel dizzy. Depression screening. You may be more likely to have a fall if you have a fear of falling, feel depressed, or feel unable to do activities that you used to do. Alcohol use screening. Using too much alcohol can affect your balance and may make you more likely to have a fall. Follow these instructions at home: Lifestyle Do not drink alcohol if: Your health care provider tells you not to drink. If you drink alcohol: Limit how much you have to: 0-1 drink a day for women. 0-2 drinks a day for men. Know how much alcohol is in your drink. In the [place] one drink equals one 12 oz bottle of beer (355 mL), one 5 oz glass of wine (148 mL), or one 1 oz glass of hard liquor (44 mL). Do not use any products that contain nicotine or tobacco. These products include cigarettes, chewing tobacco, and vaping devices, such as e-cigarettes. If you need help quitting, ask your health care provider. Activity  Follow a regular exercise program to stay fit. This will help you maintain your balance. Ask your health care provider what types of exercise are appropriate for you. If you need a cane or walker, use it as recommended by your health  care provider. Wear supportive shoes that have nonskid soles. Safety  Remove any tripping hazards, such as rugs, cords, and clutter. Install safety equipment such as grab bars in bathrooms and safety rails on stairs. Keep rooms and walkways well-lit. General instructions Talk with your health care provider about your risks for falling. Tell your health care provider if: You fall. Be sure to tell your health care provider  about all falls, even ones that seem minor. You feel dizzy, tiredness (fatigue), or off-balance. Take over-the-counter and prescription medicines only as told by your health care provider. These include supplements. Eat a healthy diet and maintain a healthy weight. A healthy diet includes low-fat dairy products, low-fat (lean) meats, and fiber from whole grains, beans, and lots of fruits and vegetables. Stay current with your vaccines. Schedule regular health, dental, and eye exams. Summary Having a healthy lifestyle and getting preventive care can help to protect your health and wellness after age 25. Screening and testing are the best way to find a health problem early and help you avoid having a fall. Early diagnosis and treatment give you the best chance for managing medical conditions that are more common for people who are older than age 104. Falls are a major cause of broken bones and head injuries in people who are older than age 65. Take precautions to prevent a fall at home. Work with your health care provider to learn what changes you can make to improve your health and wellness and to prevent falls. This information is not intended to replace advice given to you by your health care provider. Make sure you discuss any questions you have with your health care provider. Document Revised: 10/23/2020 Document Reviewed: 10/23/2020 Elsevier Patient Education  2024 [employer].

## 2023-07-24 NOTE — Assessment & Plan Note (Signed)
 Chronic, fair control. Goal BP<120/80.  EKG performed, NSR w/o acute changes.  Will continue with olmesartan /hct 40/12.5mg  daily. Encouraged to follow low sodium diet. She will f/u in four to six months for re-evaluation.

## 2023-07-25 LAB — CBC
Hematocrit: 32 % — ABNORMAL LOW (ref 34.0–46.6)
Hemoglobin: 10.3 g/dL — ABNORMAL LOW (ref 11.1–15.9)
MCH: 29.9 pg (ref 26.6–33.0)
MCHC: 32.2 g/dL (ref 31.5–35.7)
MCV: 93 fL (ref 79–97)
Platelets: 279 10*3/uL (ref 150–450)
RBC: 3.44 x10E6/uL — ABNORMAL LOW (ref 3.77–5.28)
RDW: 12 % (ref 11.7–15.4)
WBC: 5.7 10*3/uL (ref 3.4–10.8)

## 2023-07-25 LAB — CMP14+EGFR
ALT: 16 [IU]/L (ref 0–32)
AST: 22 [IU]/L (ref 0–40)
Albumin: 4.5 g/dL (ref 3.9–4.9)
Alkaline Phosphatase: 70 [IU]/L (ref 44–121)
BUN/Creatinine Ratio: 17 (ref 12–28)
BUN: 18 mg/dL (ref 8–27)
Bilirubin Total: 0.3 mg/dL (ref 0.0–1.2)
CO2: 21 mmol/L (ref 20–29)
Calcium: 9.8 mg/dL (ref 8.7–10.3)
Chloride: 104 mmol/L (ref 96–106)
Creatinine, Ser: 1.03 mg/dL — ABNORMAL HIGH (ref 0.57–1.00)
Globulin, Total: 3.4 g/dL (ref 1.5–4.5)
Glucose: 122 mg/dL — ABNORMAL HIGH (ref 70–99)
Potassium: 4.3 mmol/L (ref 3.5–5.2)
Sodium: 143 mmol/L (ref 134–144)
Total Protein: 7.9 g/dL (ref 6.0–8.5)
eGFR: 58 mL/min/{1.73_m2} — ABNORMAL LOW (ref >59)

## 2023-07-25 LAB — LIPID PANEL
Chol/HDL Ratio: 4.6 {ratio} — ABNORMAL HIGH (ref 0.0–4.4)
Cholesterol, Total: 151 mg/dL (ref 100–199)
HDL: 33 mg/dL — ABNORMAL LOW (ref >39)
LDL Chol Calc (NIH): 82 mg/dL (ref 0–99)
Triglycerides: 215 mg/dL — ABNORMAL HIGH (ref 0–149)
VLDL Cholesterol Cal: 36 mg/dL (ref 5–40)

## 2023-07-25 LAB — HEMOGLOBIN A1C
Est. average glucose Bld gHb Est-mCnc: 123 mg/dL
Hgb A1c MFr Bld: 5.9 % — ABNORMAL HIGH (ref 4.8–5.6)

## 2023-07-25 LAB — TSH: TSH: 1.95 u[IU]/mL (ref 0.450–4.500)

## 2023-07-29 DIAGNOSIS — K625 Hemorrhage of anus and rectum: Secondary | ICD-10-CM | POA: Diagnosis not present

## 2023-07-29 DIAGNOSIS — Z860100 Personal history of colon polyps, unspecified: Secondary | ICD-10-CM | POA: Diagnosis not present

## 2023-07-29 DIAGNOSIS — K2100 Gastro-esophageal reflux disease with esophagitis, without bleeding: Secondary | ICD-10-CM | POA: Diagnosis not present

## 2023-08-02 DIAGNOSIS — R051 Acute cough: Secondary | ICD-10-CM | POA: Insufficient documentation

## 2023-08-02 NOTE — Assessment & Plan Note (Signed)
 Sx likely due to PND. She was given samples of Zyrtec to take 1/2 tab nightly. Advised to avoid dairy for the next two weeks. She will let me know if her symptoms persist.

## 2023-08-02 NOTE — Assessment & Plan Note (Signed)
Chronic, LDL goal is less than 70.  She will continue with atorvastatin 20mg  daily. She is encouraged to follow heart healthy lifestyle.

## 2023-08-21 ENCOUNTER — Other Ambulatory Visit (HOSPITAL_COMMUNITY): Payer: Self-pay

## 2023-08-25 ENCOUNTER — Other Ambulatory Visit: Payer: Self-pay

## 2023-08-25 ENCOUNTER — Other Ambulatory Visit: Payer: Self-pay | Admitting: Rheumatology

## 2023-08-25 ENCOUNTER — Other Ambulatory Visit (HOSPITAL_COMMUNITY): Payer: Self-pay

## 2023-08-25 DIAGNOSIS — M810 Age-related osteoporosis without current pathological fracture: Secondary | ICD-10-CM

## 2023-08-25 DIAGNOSIS — Z79899 Other long term (current) drug therapy: Secondary | ICD-10-CM

## 2023-08-25 MED ORDER — PROLIA 60 MG/ML ~~LOC~~ SOSY
60.0000 mg | PREFILLED_SYRINGE | SUBCUTANEOUS | 0 refills | Status: DC
  Filled ????-??-??: fill #0

## 2023-08-25 MED ORDER — PROLIA 60 MG/ML ~~LOC~~ SOSY
60.0000 mg | PREFILLED_SYRINGE | SUBCUTANEOUS | 0 refills | Status: DC
  Filled 2023-08-25: qty 1, 180d supply, fill #0

## 2023-08-25 NOTE — Addendum Note (Signed)
 Addended by: Murrell Redden on: 08/25/2023 11:35 AM   Modules accepted: Orders

## 2023-08-25 NOTE — Progress Notes (Signed)
 Specialty Pharmacy Refill Coordination Note  Cindy Ross is a 73 y.o. female contacted today regarding refills of specialty medication(s) Prolia  Patient requested Courier to [facility]   Delivery date: 09/10/23  Verified address: [address] [place],  [place] [address]  Medication will be filled on 09/09/23.

## 2023-08-25 NOTE — Telephone Encounter (Signed)
 Last Fill: 03/05/2023  Labs: 07/24/2023 Glucose 122, eGFR 58, RBC 3.44, Hemoglobin 10.3, Hematocrit 32.0, Creatinine 1.03  Next Visit: 09/16/2023  Last Visit: 03/18/2023  DX: Age-related osteoporosis without current pathological fracture   Current Dose per office note 03/18/2023: Prolia 60 mg subcu days injections once every 6 months   Okay to refill Prolia?

## 2023-08-26 ENCOUNTER — Other Ambulatory Visit (INDEPENDENT_AMBULATORY_CARE_PROVIDER_SITE_OTHER)

## 2023-08-26 ENCOUNTER — Other Ambulatory Visit: Payer: Self-pay | Admitting: Internal Medicine

## 2023-08-26 DIAGNOSIS — D509 Iron deficiency anemia, unspecified: Secondary | ICD-10-CM | POA: Diagnosis not present

## 2023-08-26 DIAGNOSIS — R195 Other fecal abnormalities: Secondary | ICD-10-CM

## 2023-08-26 LAB — HEMOCCULT GUIAC POC 1CARD (OFFICE)
Card #2 Fecal Occult Blod, POC: POSITIVE
Card #3 Fecal Occult Blood, POC: POSITIVE
Fecal Occult Blood, POC: POSITIVE — AB

## 2023-09-02 NOTE — Progress Notes (Signed)
 Office Visit Note  Patient: Cindy Ross             Date of Birth: [birth year]           MRN: [id number]             PCP: Dorothyann Peng, MD Referring: Dorothyann Peng, MD Visit Date: 09/16/2023 Occupation: @GUAROCC @  Interpreter: Elliot Gurney  Subjective:  Medication management  History of Present Illness: Cindy Ross is a 73 y.o. female with osteoporosis and osteoarthritis.  She returns today after her last visit in October 2024 for the next Prolia injection.  She states she has been walking on a regular basis.  She continues to take calcium and vitamin D daily.  She denies any history of falls or fracture.  She states she continues to have some stiffness in her hands and her knee joints due to underlying osteoarthritis.  She has not noticed any joint swelling.  None of the other joints are painful.    Activities of Daily Living:  Patient reports morning stiffness for 0 minutes.   Patient Denies nocturnal pain.  Difficulty dressing/grooming: Denies Difficulty climbing stairs: Denies Difficulty getting out of chair: Denies Difficulty using hands for taps, buttons, cutlery, and/or writing: Denies  Review of Systems  Constitutional:  Negative for fatigue.  HENT:  Negative for mouth sores and mouth dryness.   Eyes:  Negative for dryness.  Respiratory:  Negative for shortness of breath.   Cardiovascular:  Negative for chest pain and palpitations.  Gastrointestinal:  Negative for abdominal pain, constipation and diarrhea.  Endocrine: Negative for increased urination.  Genitourinary:  Negative for involuntary urination.  Musculoskeletal:  Positive for joint pain, joint pain, myalgias and myalgias. Negative for gait problem, joint swelling, muscle weakness, morning stiffness and muscle tenderness.  Skin:  Negative for color change, rash, hair loss and sensitivity to sunlight.  Allergic/Immunologic: Negative for susceptible to infections.  Neurological:  Positive for dizziness. Negative  for headaches.  Hematological:  Negative for swollen glands.  Psychiatric/Behavioral:  Negative for depressed mood and sleep disturbance. The patient is not nervous/anxious.     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   BRBPR (bright red blood per rectum) 10/03/2020   Normocytic anemia 10/03/2020   Age-related osteoporosis without current pathological fracture 03/30/2020   Language barrier 03/30/2020   Respiratory crackles at left lung base 05/19/2017   Essential hypertension 01/30/2016   DM type 2 (diabetes mellitus, type 2) (HCC) 01/30/2016   Hyperlipidemia 01/30/2016    Past Medical History:  Diagnosis Date   Diabetes mellitus without complication (HCC)    Hyperlipidemia 01/30/2016   Hypertension 2007   Respiratory crackles at left lung base 05/19/2017   CXR 02/2017 in [facility] normal    Family History  Problem Relation Age of Onset   Hyperlipidemia Sister    Diabetes Sister    Hypertension Sister    Heart disease Brother    Healthy Son    Leukemia Brother    History reviewed. No pertinent surgical history. Social History   Social History Narrative   Originally from [place]   Came to [place]. In 82   Lives with younger sister, [name]   Son lives in [place].  Works in [employer].     Immunization  History  Administered Date(s) Administered   Fluad Quad(high Dose 65+) 04/18/2020, 04/02/2021, 02/28/2022   Fluad Trivalent(High Dose 65+) 03/13/2023   Influenza Inj Mdck Quad Pf 07/31/2016, 05/19/2017   Influenza, High Dose Seasonal PF 04/30/2018, 03/30/2019   Influenza-Unspecified 04/17/2018, 03/30/2019   PFIZER Comirnaty(Gray Top)Covid-19 Tri-Sucrose Vaccine 12/14/2020   PFIZER(Purple Top)SARS-COV-2 Vaccination 07/23/2019, 08/13/2019,  03/14/2020   PNEUMOCOCCAL CONJUGATE-20 08/31/2020   Pneumococcal Conjugate-13 09/14/2018, 12/29/2018   Pneumococcal Polysaccharide-23 03/19/2016   Td 10/31/1994, 04/10/1995   Tdap 01/30/2016   Zoster Recombinant(Shingrix) 05/06/2019, 07/12/2019     Objective: Vital Signs: BP 133/75 (BP Location: Left Arm, Patient Position: Sitting, Cuff Size: Normal)   Pulse 75   Resp 14   Ht 5' 0.5" (1.537 m)   Wt 125 lb 9.6 oz (57 kg)   BMI 24.13 kg/m    Physical Exam Vitals and nursing note reviewed.  Constitutional:      Appearance: She is well-developed.  HENT:     Head: Normocephalic and atraumatic.  Eyes:     Conjunctiva/sclera: Conjunctivae normal.  Cardiovascular:     Rate and Rhythm: Normal rate and regular rhythm.     Heart sounds: Normal heart sounds.  Pulmonary:     Effort: Pulmonary effort is normal.     Breath sounds: Normal breath sounds.  Abdominal:     General: Bowel sounds are normal.     Palpations: Abdomen is soft.  Musculoskeletal:     Cervical back: Normal range of motion.  Lymphadenopathy:     Cervical: No cervical adenopathy.  Skin:    General: Skin is warm and dry.     Capillary Refill: Capillary refill takes less than 2 seconds.  Neurological:     Mental Status: She is alert and oriented to person, place, and time.  Psychiatric:        Behavior: Behavior normal.      Musculoskeletal Exam: Cervical, thoracic and lumbar spine were in good range of motion without any discomfort.  Shoulders, elbows, wrists were in good range of motion.  She had bilateral PIP and DIP thickening without synovitis.  She had difficulty making a fist.  Hips and knee joints were in good range of motion.  She discomfort range of motion of her knee joints without any warmth swelling or effusion.  There was no tenderness over ankles or MTPs.  CDAI Exam: CDAI Score: -- Patient Global: --; Provider Global: -- Swollen: --; Tender: -- Joint Exam 09/16/2023   No joint exam has been  documented for this visit   There is currently no information documented on the homunculus. Go to the Rheumatology activity and complete the homunculus joint exam.  Investigation: No additional findings.  Imaging: No results found.  Recent Labs: Lab Results  Component Value Date   WBC 5.7 07/24/2023   HGB 10.3 (L) 07/24/2023   PLT 279 07/24/2023   NA 141 09/10/2023   K 4.5 09/10/2023   CL 104 09/10/2023   CO2 22 09/10/2023   GLUCOSE 98 09/10/2023   BUN 27 09/10/2023   CREATININE 1.11 (H) 09/10/2023   BILITOT 0.3 07/24/2023   ALKPHOS 70 07/24/2023   AST 22 07/24/2023   ALT 16 07/24/2023   PROT 7.9 07/24/2023   ALBUMIN 4.5 07/24/2023   CALCIUM 10.0 09/10/2023   GFRAA 65 09/12/2020    Speciality Comments: Prolia 02/2020 DEXA at [facility]  Procedures:  No procedures performed Allergies: Patient has no known allergies.   Assessment / Plan:     Visit  Diagnoses: Age-related osteoporosis without current pathological fracture - DEXA scan on March 11, 2022 showed a T-score of -3.4 and BMD 0.475 in the right femoral neck with no comparison.  She has been on Prolia 60 mg subcu injections every 6 months since September 2021 which she has been tolerating well.  Her last Prolia injection was in October.  She will receive her next Prolia injection today.  She denies any side effects from Prolia.  She has been taking calcium and vitamin D on a regular basis.  Medication monitoring encounter -last Prolia administered 03/18/23.  Labs from July 24, 2023 were reviewed.  CBC was normal except hemoglobin hemoglobin stable at 10.3.  CMP was normal with creatinine mildly elevated at 1.03.  Patient was advised to avoid all NSAIDs.  Patient states she is not taking Advil.  She is on diuretics which could be contributing to elevated creatinine.  Vitamin D deficiency-vitamin D was normal at 47.5 on March 13, 2023.  Primary osteoarthritis of both hands-she is few osteoarthritis in  her hands with PIP and DIP thickening and ongoing discomfort.  Joint protection muscle strengthening was discussed.  A handout on hand exercises was given.  Chronic pain of right knee-she complains of discomfort in her knee joints.  No warmth swelling or effusion was noted.  A handout on knee exercises was given.  Pure hypercholesterolemia  Essential hypertension-blood pressure was normal at 133/75.  Type 2 diabetes mellitus without complication, without long-term current use of insulin (HCC)  Language barrier-interpreter was present throughout the whole visit.  Orders: Orders Placed This Encounter  Procedures   Comprehensive metabolic panel with GFR   VITAMIN D 25 Hydroxy (Vit-D Deficiency, Fractures)   Meds ordered this encounter  Medications   denosumab (PROLIA) injection 60 mg    Patient is enrolled in [facility] program for this medication and I have provided a copy of the Prolia Medication Guide and Patient Brochure.:   Yes    I have reviewed with the patient the information in the Prolia Medication Guide and Patient Counseling Chart including the serious risks of Prolia and symptoms of each risk.:   Yes    I have advised the patient to seek medical attention if they have signs or symptoms of any of the serious risks.:   Yes     Follow-Up Instructions: Return in about 6 months (around 03/17/2024) for Osteoporosis, Osteoarthritis.   Pollyann Savoy, MD  Note - This record has been created using Animal nutritionist.  Chart creation errors have been sought, but may not always  have been located. Such creation errors do not reflect on  the standard of medical care.

## 2023-09-09 ENCOUNTER — Other Ambulatory Visit: Payer: Self-pay

## 2023-09-09 ENCOUNTER — Ambulatory Visit: Payer: Medicare Other | Admitting: Internal Medicine

## 2023-09-10 ENCOUNTER — Ambulatory Visit (INDEPENDENT_AMBULATORY_CARE_PROVIDER_SITE_OTHER): Admitting: Family Medicine

## 2023-09-10 ENCOUNTER — Encounter: Payer: Self-pay | Admitting: Family Medicine

## 2023-09-10 VITALS — BP 110/60 | HR 77 | Temp 98.2°F | Ht 60.0 in | Wt 128.0 lb

## 2023-09-10 DIAGNOSIS — N1831 Chronic kidney disease, stage 3a: Secondary | ICD-10-CM

## 2023-09-10 DIAGNOSIS — E7800 Pure hypercholesterolemia, unspecified: Secondary | ICD-10-CM | POA: Diagnosis not present

## 2023-09-10 DIAGNOSIS — I129 Hypertensive chronic kidney disease with stage 1 through stage 4 chronic kidney disease, or unspecified chronic kidney disease: Secondary | ICD-10-CM | POA: Diagnosis not present

## 2023-09-10 DIAGNOSIS — E1122 Type 2 diabetes mellitus with diabetic chronic kidney disease: Secondary | ICD-10-CM | POA: Diagnosis not present

## 2023-09-10 NOTE — Assessment & Plan Note (Addendum)
 Chronic. Continue atorvastatin 20 mg every day

## 2023-09-10 NOTE — Patient Instructions (Signed)

## 2023-09-10 NOTE — Progress Notes (Signed)
 I,Jameka J Llittleton, CMA,acting as a Neurosurgeon for [employer], NP.,have documented all relevant documentation on the behalf of Ellender Hose, NP,as directed by  Ellender Hose, NP while in the presence of Ellender Hose, NP.  Subjective:  Patient ID: Cindy Ross , female    DOB: 1951-02-12 , 73 y.o.   MRN: [id number]  Chief Complaint  Patient presents with   Diabetes    HPI  Patient is a 73 year female who presents today for chronic disease management. She has a diagnosis of diabetes, hypertension and hyperlipidemia.She is accompanied today by her sister, she states that  she needs a refill on  Farxiga 10 mg every day. Patient reports compliance with the farxiga and is tolerating it well. She reports she would like to have another sample because the medication cost to much.   Diabetes She presents for her follow-up diabetic visit. She has type 2 diabetes mellitus. Her disease course has been stable. There are no hypoglycemic associated symptoms. Pertinent negatives for diabetes include no blurred vision, no chest pain, no polydipsia, no polyphagia and no polyuria. There are no hypoglycemic complications. Risk factors for coronary artery disease include hypertension and dyslipidemia. She is compliant with treatment most of the time. She is following a diabetic diet. When asked about meal planning, she reported none. She participates in exercise intermittently. Her home blood glucose trend is fluctuating minimally. Her breakfast blood glucose is taken between 9-10 am. Her breakfast blood glucose range is generally 110-130 mg/dl. An ACE inhibitor/angiotensin II receptor blocker is being taken. Eye exam is not current.  Hypertension This is a chronic problem. The current episode started more than 1 year ago. The problem has been gradually improving since onset. The problem is controlled. Pertinent negatives include no blurred vision, chest pain or palpitations. Risk factors for coronary artery disease include  diabetes mellitus, dyslipidemia and post-menopausal state. The current treatment provides moderate improvement.     Past Medical History:  Diagnosis Date   Diabetes mellitus without complication (HCC)    Hyperlipidemia 01/30/2016   Hypertension 2007   Respiratory crackles at left lung base 05/19/2017   CXR 02/2017 in [facility] normal     Family History  Problem Relation Age of Onset   Hyperlipidemia Sister    Diabetes Sister    Hypertension Sister    Heart disease Brother    Healthy Son    Leukemia Brother      Current Outpatient Medications:    Accu-Chek Neurosurgeon MISC, : Use as directed to check blood sugars 2 times per day dx:e11.22, Disp: 300 each, Rfl: 2   ACCU-CHEK GUIDE test strip, Use as instructed, Disp: 200 strip, Rfl: 4   aspirin EC 81 MG tablet, Take 1 tablet (81 mg total) by mouth daily., Disp: , Rfl:    atorvastatin (LIPITOR) 20 MG tablet, Take 1 tablet (20 mg total) by mouth daily., Disp: 90 tablet, Rfl: 1   Blood Glucose Monitoring Suppl (ACCU-CHEK GUIDE) w/Device KIT, : Use as directed to check blood sugars 2 times per day dx:e11.22, Disp: 1 kit, Rfl: 1   cholecalciferol (VITAMIN D) 1000 units tablet, Take 1,000 Units by mouth daily., Disp: , Rfl:    CLENPIQ 10-3.5-12 MG-GM -GM/160ML SOLN, , Disp: , Rfl:    denosumab (PROLIA) 60 MG/ML SOSY injection, Inject 60 mg into the skin every 6 (six) months. Courier to rheum: [address], [address], [place] [address]. Appt on 09/16/23, Disp: 1 mL, Rfl: 0   hydrocortisone (  ANUSOL-HC) 2.5 % rectal cream, Place 1 Application rectally 2 (two) times daily. As needed, Disp: 30 g, Rfl: 0   ibuprofen (ADVIL) 800 MG tablet, Take 800 mg by mouth every 6 (six) hours as needed., Disp: , Rfl:    metFORMIN (GLUCOPHAGE) 1000 MG tablet, Take 1 tablet (1,000 mg total) by mouth 2 (two) times daily., Disp: 180 tablet, Rfl: 2   Multiple Vitamins-Minerals (ONE-A-DAY WOMENS 50+ ADVANTAGE PO), Take 1 tablet by mouth daily at  12 noon., Disp: , Rfl:    olmesartan-hydrochlorothiazide (BENICAR HCT) 40-12.5 MG tablet, Take 1 tablet by mouth daily., Disp: 90 tablet, Rfl: 1   No Known Allergies   Review of Systems  Constitutional: Negative.   HENT: Negative.    Eyes: Negative.  Negative for blurred vision.  Respiratory: Negative.    Cardiovascular:  Negative for chest pain, palpitations and leg swelling.  Endocrine: Negative for polydipsia, polyphagia and polyuria.  Musculoskeletal: Negative.   Skin: Negative.   Psychiatric/Behavioral: Negative.       Today's Vitals   09/10/23 0936  BP: 110/60  Pulse: 77  Temp: 98.2 F (36.8 C)  TempSrc: Oral  Weight: 128 lb (58.1 kg)  Height: 5' (1.524 m)  PainSc: 0-No pain   Body mass index is 25 kg/m.  Wt Readings from Last 3 Encounters:  09/10/23 128 lb (58.1 kg)  07/24/23 128 lb (58.1 kg)  03/18/23 126 lb 12.8 oz (57.5 kg)    The 10-year ASCVD risk score (Arnett DK, et al., 2019) is: 17.7%   Values used to calculate the score:     Age: 14 years     Sex: Female     Is Non-Hispanic African American: No     Diabetic: Yes     Tobacco smoker: No     Systolic Blood Pressure: 110 mmHg     Is BP treated: Yes     HDL Cholesterol: 33 mg/dL     Total Cholesterol: 151 mg/dL  Objective:  Physical Exam HENT:     Head: Normocephalic.  Cardiovascular:     Rate and Rhythm: Normal rate and regular rhythm.  Pulmonary:     Effort: Pulmonary effort is normal.     Breath sounds: Normal breath sounds.  Skin:    General: Skin is warm and dry.  Neurological:     General: No focal deficit present.     Mental Status: She is alert and oriented to person, place, and time.  Psychiatric:        Mood and Affect: Mood normal.        Behavior: Behavior normal.         Assessment And Plan:  Type 2 diabetes mellitus with stage 3a chronic kidney disease, without long-term current use of insulin (HCC) Assessment & Plan: Chronic, well controlled. A1c 5.9, continue current  treatment metformin 1000 mg BID and farxiga 10 mg every day.  Orders: -     AMB Referral [facility] Care Management  Parenchymal renal hypertension, stage 1 through stage 4 or unspecified chronic kidney disease Assessment & Plan: Encouraged to keep BP well controlled and avoid use of NSAIDs   Orders: -     Basic metabolic panel with GFR  Pure hypercholesterolemia Assessment & Plan: Chronic. Continue atorvastatin 20 mg every day      Return if symptoms worsen or fail to improve, for keep next appt.  Patient was given opportunity to ask questions. Patient verbalized understanding of the plan and was able to repeat key  elements of the plan. All questions were answered to their satisfaction.    I, Ellender Hose, NP, have reviewed all documentation for this visit. The documentation on 09/15/2023 for the exam, diagnosis, procedures, and orders are all accurate and complete.     IF YOU HAVE BEEN REFERRED TO A SPECIALIST, IT MAY TAKE 1-2 WEEKS TO SCHEDULE/PROCESS THE REFERRAL. IF YOU HAVE NOT HEARD FROM US/SPECIALIST IN TWO WEEKS, PLEASE GIVE [place] A CALL AT [phone].

## 2023-09-10 NOTE — Assessment & Plan Note (Signed)
 Chronic, well controlled. A1c 5.9, continue current treatment metformin 1000 mg BID and farxiga 10 mg every day.

## 2023-09-10 NOTE — Assessment & Plan Note (Addendum)
 Encouraged to keep BP well controlled and avoid use of NSAIDs , continue current treatment plan Benicar 40/12.5mg   every day

## 2023-09-11 ENCOUNTER — Telehealth: Payer: Self-pay | Admitting: Pharmacist

## 2023-09-11 ENCOUNTER — Other Ambulatory Visit: Payer: Self-pay | Admitting: Internal Medicine

## 2023-09-11 DIAGNOSIS — Z0000 Encounter for general adult medical examination without abnormal findings: Secondary | ICD-10-CM

## 2023-09-11 DIAGNOSIS — Z79899 Other long term (current) drug therapy: Secondary | ICD-10-CM

## 2023-09-11 LAB — BASIC METABOLIC PANEL WITH GFR
BUN/Creatinine Ratio: 24 (ref 12–28)
BUN: 27 mg/dL (ref 8–27)
CO2: 22 mmol/L (ref 20–29)
Calcium: 10 mg/dL (ref 8.7–10.3)
Chloride: 104 mmol/L (ref 96–106)
Creatinine, Ser: 1.11 mg/dL — ABNORMAL HIGH (ref 0.57–1.00)
Glucose: 98 mg/dL (ref 70–99)
Potassium: 4.5 mmol/L (ref 3.5–5.2)
Sodium: 141 mmol/L (ref 134–144)
eGFR: 53 mL/min/{1.73_m2} — ABNORMAL LOW (ref >59)

## 2023-09-11 NOTE — Progress Notes (Signed)
   09/11/2023  Patient ID: Cindy Ross, female   DOB: 1950-07-07, 73 y.o.   MRN: [id number]  Patient was called per referral for medication assistance from her Provider. Unfortunately, she did not answer the phone. HIPAA compliant message could not be left because her voicemail box was not set up.  Plan: Call patient back next week.  Beecher Mcardle, PharmD, BCACP Clinical Pharmacist [phone]

## 2023-09-15 ENCOUNTER — Encounter: Payer: Self-pay | Admitting: Family Medicine

## 2023-09-15 DIAGNOSIS — I129 Hypertensive chronic kidney disease with stage 1 through stage 4 chronic kidney disease, or unspecified chronic kidney disease: Secondary | ICD-10-CM | POA: Insufficient documentation

## 2023-09-15 NOTE — Progress Notes (Signed)
 Kidney function has not changed much since last month, so this is chronic kidney disease, advised to avoid NSAIDs such as ibuprofen, aleve,naproxen etc and stay hydrated with water  Thanks

## 2023-09-16 ENCOUNTER — Ambulatory Visit: Payer: Medicare Other | Attending: Rheumatology | Admitting: Rheumatology

## 2023-09-16 ENCOUNTER — Encounter: Payer: Self-pay | Admitting: Rheumatology

## 2023-09-16 ENCOUNTER — Telehealth: Payer: Self-pay | Admitting: Pharmacist

## 2023-09-16 VITALS — BP 133/75 | HR 75 | Resp 14 | Ht 60.5 in | Wt 125.6 lb

## 2023-09-16 DIAGNOSIS — M25561 Pain in right knee: Secondary | ICD-10-CM

## 2023-09-16 DIAGNOSIS — E119 Type 2 diabetes mellitus without complications: Secondary | ICD-10-CM

## 2023-09-16 DIAGNOSIS — M19041 Primary osteoarthritis, right hand: Secondary | ICD-10-CM

## 2023-09-16 DIAGNOSIS — M19042 Primary osteoarthritis, left hand: Secondary | ICD-10-CM | POA: Diagnosis not present

## 2023-09-16 DIAGNOSIS — E559 Vitamin D deficiency, unspecified: Secondary | ICD-10-CM | POA: Diagnosis not present

## 2023-09-16 DIAGNOSIS — M810 Age-related osteoporosis without current pathological fracture: Secondary | ICD-10-CM | POA: Diagnosis not present

## 2023-09-16 DIAGNOSIS — G8929 Other chronic pain: Secondary | ICD-10-CM

## 2023-09-16 DIAGNOSIS — E7800 Pure hypercholesterolemia, unspecified: Secondary | ICD-10-CM | POA: Diagnosis not present

## 2023-09-16 DIAGNOSIS — I10 Essential (primary) hypertension: Secondary | ICD-10-CM | POA: Diagnosis not present

## 2023-09-16 DIAGNOSIS — Z5181 Encounter for therapeutic drug level monitoring: Secondary | ICD-10-CM | POA: Diagnosis not present

## 2023-09-16 DIAGNOSIS — Z758 Other problems related to medical facilities and other health care: Secondary | ICD-10-CM

## 2023-09-16 DIAGNOSIS — Z603 Acculturation difficulty: Secondary | ICD-10-CM

## 2023-09-16 DIAGNOSIS — E1122 Type 2 diabetes mellitus with diabetic chronic kidney disease: Secondary | ICD-10-CM

## 2023-09-16 MED ORDER — DENOSUMAB 60 MG/ML ~~LOC~~ SOSY
60.0000 mg | PREFILLED_SYRINGE | Freq: Once | SUBCUTANEOUS | Status: AC
Start: 2023-09-16 — End: 2023-09-16
  Administered 2023-09-16: 60 mg via SUBCUTANEOUS

## 2023-09-16 MED ORDER — DENOSUMAB 60 MG/ML ~~LOC~~ SOSY: 60.0000 mg | PREFILLED_SYRINGE | SUBCUTANEOUS | Status: DC

## 2023-09-16 NOTE — Patient Instructions (Addendum)
 Please return for lab work 1 week prior to your next Prolia injection.  Hand Exercises Hand exercises can be helpful for almost anyone. They can strengthen your hands and improve flexibility and movement. The exercises can also increase blood flow to the hands. These results can make your work and daily tasks easier for you. Hand exercises can be especially helpful for people who have joint pain from arthritis or nerve damage from using their hands over and over. These exercises can also help people who injure a hand. Exercises Most of these hand exercises are gentle stretching and motion exercises. It is usually safe to do them often throughout the day. Warming up your hands before exercise may help reduce stiffness. You can do this with gentle massage or by placing your hands in warm water for 10-15 minutes. It is normal to feel some stretching, pulling, tightness, or mild discomfort when you begin new exercises. In time, this will improve. Remember to always be careful and stop right away if you feel sudden, very bad pain or your pain gets worse. You want to get better and be safe. Ask your health care provider which exercises are safe for you. Do exercises exactly as told by your provider and adjust them as told. Do not begin these exercises until told by your provider. Knuckle bend or "claw" fist  Stand or sit with your arm, hand, and all five fingers pointed straight up. Make sure to keep your wrist straight. Gently bend your fingers down toward your palm until the tips of your fingers are touching your palm. Keep your big knuckle straight and only bend the small knuckles in your fingers. Hold this position for 10 seconds. Straighten your fingers back to your starting position. Repeat this exercise 5-10 times with each hand. Full finger fist  Stand or sit with your arm, hand, and all five fingers pointed straight up. Make sure to keep your wrist straight. Gently bend your fingers into your  palm until the tips of your fingers are touching the middle of your palm. Hold this position for 10 seconds. Extend your fingers back to your starting position, stretching every joint fully. Repeat this exercise 5-10 times with each hand. Straight fist  Stand or sit with your arm, hand, and all five fingers pointed straight up. Make sure to keep your wrist straight. Gently bend your fingers at the big knuckle, where your fingers meet your hand, and at the middle knuckle. Keep the knuckle at the tips of your fingers straight and try to touch the bottom of your palm. Hold this position for 10 seconds. Extend your fingers back to your starting position, stretching every joint fully. Repeat this exercise 5-10 times with each hand. Tabletop  Stand or sit with your arm, hand, and all five fingers pointed straight up. Make sure to keep your wrist straight. Gently bend your fingers at the big knuckle, where your fingers meet your hand, as far down as you can. Keep the small knuckles in your fingers straight. Think of forming a tabletop with your fingers. Hold this position for 10 seconds. Extend your fingers back to your starting position, stretching every joint fully. Repeat this exercise 5-10 times with each hand. Finger spread  Place your hand flat on a table with your palm facing down. Make sure your wrist stays straight. Spread your fingers and thumb apart from each other as far as you can until you feel a gentle stretch. Hold this position for 10 seconds. Bring  your fingers and thumb tight together again. Hold this position for 10 seconds. Repeat this exercise 5-10 times with each hand. Making circles  Stand or sit with your arm, hand, and all five fingers pointed straight up. Make sure to keep your wrist straight. Make a circle by touching the tip of your thumb to the tip of your index finger. Hold for 10 seconds. Then open your hand wide. Repeat this motion with your thumb and each of  your fingers. Repeat this exercise 5-10 times with each hand. Thumb motion  Sit with your forearm resting on a table and your wrist straight. Your thumb should be facing up toward the ceiling. Keep your fingers relaxed as you move your thumb. Lift your thumb up as high as you can toward the ceiling. Hold for 10 seconds. Bend your thumb across your palm as far as you can, reaching the tip of your thumb for the small finger (pinkie) side of your palm. Hold for 10 seconds. Repeat this exercise 5-10 times with each hand. Grip strengthening  Hold a stress ball or other soft ball in the middle of your hand. Slowly increase the pressure, squeezing the ball as much as you can without causing pain. Think of bringing the tips of your fingers into the middle of your palm. All of your finger joints should bend when doing this exercise. Hold your squeeze for 10 seconds, then relax. Repeat this exercise 5-10 times with each hand. Contact a health care provider if: Your hand pain or discomfort gets much worse when you do an exercise. Your hand pain or discomfort does not improve within 2 hours after you exercise. If you have either of these problems, stop doing these exercises right away. Do not do them again unless your provider says that you can. Get help right away if: You develop sudden, severe hand pain or swelling. If this happens, stop doing these exercises right away. Do not do them again unless your provider says that you can. This information is not intended to replace advice given to you by your health care provider. Make sure you discuss any questions you have with your health care provider. Document Revised: 06/18/2022 Document Reviewed: 06/18/2022 Elsevier Patient Education  2024 Elsevier Inc.Cc bi t?p cho ?au ??u g?i m?n tnh Exercises for Chronic Knee Pain ?au ??u g?i m?n tnh l ?au ko di h?n 3 thng. ??i v?i h?u h?t nh?ng ng??i b? ?au ??u g?i m?n tnh, t?p th? d?c v gi?m cn l m?t  ph?n quan tr?ng c?a qu trnh ?i?u tr?Shaune Pascal gia ch?m so?c s??c kho?e c th? mu?n qu v? t?p trung vo: T?ng c??ng s?c m?nh cc c? nng ?? ??u g?i c?a qu v?. Qua ? c th? lm gi?m p l?c ??t ln ??u g?i v gi?m ?au. Phng ng?a c?ng kh?p g?i. Qu v? c th? c? ??ng ??u g?i bao xa, duy tr ? m?c ? ho?c xa h?n. Gi?m cn (n?u c th?) ?? gi?m p l?c ln ??u g?i, gi?m nguy c? t?n th??ng v gip qu v? t?p th? d?c d? dng h?n. Chuyn gia ch?m [place] s?c kh?e s? gip qu v? xy d?ng m?t ch??ng trnh t?p th? d?c ph h?p v?i nhu c?u v kh? n?ng th? ch?t c?a qu v?. D??i ?y l cc bi t?p ??n gi?n, tc ??ng th?p m qu v? c th? th?c hi?n t?i nh. Hy h?i chuyn gia ch?m [place] s?c kh?e ho?c chuyn gia v?t l tr? li?u v? t?n su?t  qu v? nn th?c hi?n ch??ng trnh t?p th? d?c v l?p l?i bao nhiu l?n m?i bi t?p. L?i [employer] v? an ton  H?i chuyn gia ch?m [place] s?c kh?e ?? ???c ch?p thu?n tr??c khi th?c hi?n b?t k? bi t?p no. B?t ??u t? t? v d?ng l?i b?t c? lc no qu v? c?m th?y ?au. Khng t?p th? d?c n?u ?au ??u g?i ?ang bng pht. Kh?i ??ng tr??c tin. Gin c? ch?a kh?i ??ng c th? gy ra t?n th??ng. Th?c hi?n 5-10 pht c? ??ng d? dng ho?c gin c? nh? nhng tr??c khi b?t ??u bi t?p. Th?c hi?n 5-10 pht ho?t ??ng tc ??ng th?p (nh? ?i b? ho?c ??p xe) tr??c khi b?t ??u cc bi t?p t?ng c??ng s?c m?nh. Lin h? v?i chuyn gia ch?m [place] s?c kh?e b?t c? lc no qu v? b? ?au trong ho?c sau khi t?p th? d?c. T?p th? d?c c th? gy kh ch?u nh?ng khng ???c gy ?au ??n. C?m gic h?i c?ng kh?p ho?c ?au sau khi t?p th? d?c l bnh th??ng. Cc bi t?p gin c? v bi t?p ph?m vi v?n ??ng Gin c? ?i tr??c  ??ng th?ng v nng ?? c? th? b?ng cch v?n vo gh? ho?c t? m?t bn tay ln t??ng. Gi? hai chn th?ng v st vo nhau, g?p m?t ??u g?i ?? nng gt chn ln v? pha mng. Dng m?t tay ?? ??, dng bn tay kia n?m c? chn. Ko bn chn ln g?n h?n v? pha mng ?? c?m nh?n s?c c?ng ? tr??c ?i. Gi? cho c? c?ng  trong 30 giy. L?p l?i __________ l?n. Hon t?t bi t?p ny __________ l?n m?i ngy. Gin c? ?i sau  Ng?i trn sn nh, l?ng th?ng v hai chn du?i th?ng ra pha tr??c. ??t hai lng bn tay ln sn v tr??t v? pha hai bn chn khi qu v? g?p hng. C? g?ng ch?m m?i vo ??u g?i v c?m th?y c?ng c? ? m?t sau hai bn ?i. Gi? trong 30 giy. L?p l?i __________ l?n. Hon t?t bi t?p ny __________ l?n m?i ngy. Gin c? b?p chn  ??ng ??i di?n v?i m?t b?c t??ng. ??t hai lng bn tay t? b?ng ph?ng trn t??ng, cnh tay m? r?ng v h?i nghing ng??i v? pha t??ng. ??ng ? t? th? chng chn v?i m?t chn g?p l?i ? ??u g?i v chn kia du?i th?ng ra pha sau qu v?. Gi? c? hai bn chn quay vo t??ng v t?ng m?c ?? g?p ? ??u g?i trong khi gi? cho gt c?a chn kia b?ng ph?ng trn m?t ??t. Qu v? c?n c?m th?y c?ng c? ? b?p chn. Gi? trong 30 giy. L?p l?i __________ l?n. Hon t?t bi t?p ny __________ l?n m?i ngy. Cc bi t?p c? l?c Nng chn th?ng  N?m ng?a, m?t ??u g?i g?p l?i v chn kia du?i th?ng. T? t? nng chn th?ng ln m khng g?p ??u g?i. Nng cho ??n khi bn chn cao h?n sn kho?ng 12 inch (30 cm). Gi? trong 3-5 giy v t? t? h? th?p chn. L?p l?i __________ l?n. Hon t?t bi t?p ny __________ l?n m?i ngy. Nhn m?t chn  ??ng gi?a hai chi?c gh? v ??t c? hai bn tay ln l?ng gh? ?? lm ?i?m t?a. Du?i th?ng m?t chn, ?? tr?ng l??ng c? th? d?n trn gt chn ?ang ??ng. T? t? g?p ??u g?i c?a chn ?ang ??ng ?? nhn ng??i xu?ng m?c m qu  v? th?y tho?i mi. Gi? trong 3-5 giy. L?p l?i __________ l?n. Hon t?t bi t?p ny __________ l?n m?i ngy. Cu?n t? ?i sau  ??ng th?ng, hai ??u g?i st vo nhau, quay m?t v? pha l?ng c?a m?t chi?c gh?. Dng c? hai bn tay bm vo l?ng gh?Sandria Manly? m?t chn th?ng. G?p ??u g?i cn l?i trong khi ??a gt chn ln pha mng cho ??n khi ??u g?i g?p ? gc 90 ?? (gc ph?i). Gi? trong 3-5 giy. L?p l?i __________ l?n. Hon t?t bi t?p ny __________ l?n m?i  ngy. T?a l?ng vo t??ng  ??ng th?ng v?i l?ng, hng v ??u t?a vo t??ng. B??c v? pha tr??c m?t b??c m?i l?n trong khi l?ng v?n t?a vo t??ng. Chn c?a qu v? ph?i cch t??ng 2 feet (61 cm) ? chi?u r?ng b?ng vai. Gi? l?ng, hng v ??u t?a vo t??ng, tr??t xu?ng t??ng ??n cng g?n t? th? ng?i cng t?t. Gi? trong 5-10 giy, sau ? t? t? tr??t ng??c ln. L?p l?i __________ l?n. Hon t?t bi t?p ny __________ l?n m?i ngy. B??c ln  ??ng tr??c b? v?ng ch?c ho?c gh? cao kho?ng 6 inch (15 cm). T? t? b??c ln b?ng bn chn tri / ph?i, gi? cho ??u g?i th?ng hng v?i hng v bn chn. Khng ?? ??u g?i g?p ??n m?c qu v? khng th? nhn th?y ngn chn. Bm vo gh? ?? gi? th?ng b?ng, nh?ng khng dng gh? ?? ??. T? t? th? l?ng ??u g?i v t? h? th?p xu?ng v? tr b?t ??u. L?p l?i __________ l?n. Hon t?t bi t?p ny __________ l?n m?i ngy. Hy lin l?c v?i chuyn gia ch?m [place] s?c kh?e n?u: Bi t?p th? d?c c?a qu v? gy ?au. ?au tr?m tr?ng h?n sau khi t?p th? d?c. ?au khi?n qu v? khng th? t?p th? d?c. Thng tin ny khng nh?m m?c ?ch thay th? cho l?i khuyn m chuyn gia ch?m [place] s?c kh?e ni v?i qu v?. Hy b?o ??m qu v? ph?i th?o [place]?n b?t k? v?n ?? g m qu v? c v?i chuyn gia ch?m [place] s?c kh?e c?a qu v?. Document Revised: 07/17/2022 Document Reviewed: 07/17/2022 Elsevier Patient Education  2024 [employer].

## 2023-09-16 NOTE — Progress Notes (Signed)
 Pharmacy Note  Subjective:   Patient presents to clinic today to receive bi-annual dose of Prolia. Patient's last dose of Prolia was on 03/18/2023. She has routine dental cleaning later this month but denies upcoming surgeries or invasive dental procedures  Patient running a fever or have signs/symptoms of infection? No  Patient currently on antibiotics for the treatment of infection? No  Patient had fall in the last 6 months?  No    Patient taking calcium 1200 mg daily through diet or supplement and at least 800 units vitamin D? Yes  Objective: CMP     Component Value Date/Time   NA 141 09/10/2023 1024   K 4.5 09/10/2023 1024   CL 104 09/10/2023 1024   CO2 22 09/10/2023 1024   GLUCOSE 98 09/10/2023 1024   GLUCOSE 99 09/12/2022 1000   BUN 27 09/10/2023 1024   CREATININE 1.11 (H) 09/10/2023 1024   CREATININE 1.26 (H) 09/12/2022 1000   CALCIUM 10.0 09/10/2023 1024   PROT 7.9 07/24/2023 0929   ALBUMIN 4.5 07/24/2023 0929   AST 22 07/24/2023 0929   ALT 16 07/24/2023 0929   ALKPHOS 70 07/24/2023 0929   BILITOT 0.3 07/24/2023 0929   GFRNONAA 56 (L) 09/12/2020 1336   GFRAA 65 09/12/2020 1336    CBC    Component Value Date/Time   WBC 5.7 07/24/2023 0929   WBC 5.3 09/12/2022 1000   RBC 3.44 (L) 07/24/2023 0929   RBC 3.5 (A) 10/14/2022 0000   HGB 10.3 (L) 07/24/2023 0929   HCT 32.0 (L) 07/24/2023 0929   PLT 279 07/24/2023 0929   MCV 93 07/24/2023 0929   MCH 29.9 07/24/2023 0929   MCH 31.0 09/12/2022 1000   MCHC 32.2 07/24/2023 0929   MCHC 32.3 09/12/2022 1000   RDW 12.0 07/24/2023 0929   LYMPHSABS 2,724 09/12/2022 1000   LYMPHSABS 3.9 (H) 11/18/2016 0936   EOSABS 413 09/12/2022 1000   EOSABS 0.6 (H) 11/18/2016 0936   BASOSABS 58 09/12/2022 1000   BASOSABS 0.0 11/18/2016 0936    Lab Results  Component Value Date   VD25OH 47.5 03/13/2023   DEXA scan on March 11, 2022 showed a T-score of -3.4 and BMD 0.475 in the right femoral neck with no comparison    Assessment/Plan:   Reviewed importance of adequate dietary intake of calcium in addition to supplementation due to risk of hypocalcemia with Prolia.   Patient tolerated injection well.   Administration details as below: Administrations This Visit     denosumab (PROLIA) injection 60 mg     Admin Date 09/16/2023 Action Given Dose 60 mg Route Subcutaneous Documented By Murrell Redden, RPH-CPP           Patient's next Prolia dose is due on 03/19/2024.  Patient is due for updated DEXA in September 2025.   All questions encouraged and answered.  Instructed patient to call with any further questions or concerns.   Chesley Mires, PharmD, MPH, BCPS, CPP Clinical Pharmacist (Rheumatology and Pulmonology)

## 2023-09-17 ENCOUNTER — Telehealth: Payer: Self-pay

## 2023-09-17 NOTE — Telephone Encounter (Signed)
 PAP: Patient assistance application for Farxiga through [employer] ([employer]) has been mailed to pt's home address on file. Provider portion of application will be faxed to provider's office.

## 2023-09-17 NOTE — Progress Notes (Signed)
 09/17/2023 Name: Cindy Ross MRN: [id number] DOB: 05-Oct-1950  Chief Complaint  Patient presents with   Medication Assistance    Cindy Ross is a 73 y.o. year old female who presented for a telephone visit.   They were referred to the pharmacist by their PCP for assistance in managing medication access.    Subjective:  Care Team: Primary Care Provider: Dorothyann Peng, MD ; Next Scheduled Visit: 11/25/2023  Medication Access/Adherence  Current Pharmacy:  [id number] [place], [place] - [address] AT [facility] [address] [place] [address] Phone: [phone] Fax: [phone]   Patient reports affordability concerns with their medications: Yes  Farxiga Patient reports access/transportation concerns to their pharmacy: No  Patient reports adherence concerns with their medications:  No      Diabetes:  Current medications:  Farxiga 10 mg 1 tablet daily Metformin 1000 mg 1 tablet twice daily   On statin therapy:  Atorvastatin   Hypertension:  Olmesartan/HCTZ 40/12.5. 1 tablet twice daily    Objective:  Lab Results  Component Value Date   HGBA1C 5.9 (H) 07/24/2023    Lab Results  Component Value Date   CREATININE 1.11 (H) 09/10/2023   BUN 27 09/10/2023   NA 141 09/10/2023   K 4.5 09/10/2023   CL 104 09/10/2023   CO2 22 09/10/2023    Lab Results  Component Value Date   CHOL 151 07/24/2023   HDL 33 (L) 07/24/2023   LDLCALC 82 07/24/2023   TRIG 215 (H) 07/24/2023   CHOLHDL 4.6 (H) 07/24/2023    Medications Reviewed Today     Reviewed by Beecher Mcardle, RPH (Pharmacist) on 09/16/23 at 1428  Med List Status: <None>   Medication Order Taking? Sig Documenting Provider Last Dose Status Informant  Accu-Chek FastClix Lancets MISC [id number] Yes : Use as directed to check blood sugars 2 times per day dx:e11.22 Dorothyann Peng, MD Taking Active   ACCU-CHEK GUIDE test strip [id number] Yes Use as instructed Dorothyann Peng, MD Taking Active   aspirin EC 81 MG tablet [id number] Yes Take 1 tablet (81 mg total) by mouth daily. Julieanne Manson, MD Taking Active   atorvastatin (LIPITOR) 20 MG tablet [id number] Yes Take 1 tablet (20 mg total) by mouth daily. Dorothyann Peng, MD Taking Active   Blood Glucose Monitoring Suppl (ACCU-CHEK GUIDE) w/Device KIT [id number] Yes : Use as directed to check blood sugars 2 times per day dx:e11.22 Dorothyann Peng, MD Taking Active   calcium carbonate (OSCAL) 1500 (600 Ca) MG TABS tablet [id number] Yes Take 600 mg of elemental calcium by mouth daily. [provider] Taking Active   cholecalciferol (VITAMIN D) 1000 units tablet [id number] Yes Take 1,000 Units by mouth daily. [provider] Taking Active Self           Med Note Luiz Blare, [name]   Tue Mar 18, 2023  8:45 AM)    CLENPIQ 10-3.5-12 MG-GM -GM/160ML Criss Rosales [id number] No   Patient not taking: Reported on 09/16/2023   [provider] Not Taking Active   dapagliflozin propanediol (FARXIGA) 10 MG TABS tablet [id number] Yes Take 10 mg by mouth daily. [provider] Taking Active   denosumab (PROLIA) 60 MG/ML SOSY injection [id number] Yes Inject 60 mg into the skin every 6 (six) months. Courier to rheum: [address], [place] [address]. Appt on 09/16/23 Pollyann Savoy, MD Taking Active   denosumab [facility]) injection 60 mg [id number]  Pollyann Savoy, MD  Active   Glucosamine HCl (GLUCOSAMINE PO) [id number] Yes Take by mouth daily. [provider] Taking Active   hydrocortisone (ANUSOL-HC) 2.5 % rectal cream [id number] Yes Place 1 Application rectally 2 (two) times daily. As needed Dorothyann Peng, MD Taking Active   metFORMIN (GLUCOPHAGE) 1000 MG tablet [id number] Yes Take 1 tablet (1,000 mg total) by mouth 2 (two) times daily.  Patient taking differently: Take 1,000 mg by mouth daily.   Dorothyann Peng, MD Taking Active   Multiple Vitamins-Minerals (ONE-A-DAY WOMENS 50+  ADVANTAGE PO) [id number] Yes Take 1 tablet by mouth daily at 12 noon. [provider] Taking Active Self  olmesartan-hydrochlorothiazide (BENICAR HCT) 40-12.5 MG tablet [id number] Yes Take 1 tablet by mouth daily. Dorothyann Peng, MD Taking Active               Assessment/Plan:   Diabetes: - Currently controlled - Meets financial criteria for Farxiga patient assistance program through [employer] . Will collaborate with provider, CPhT, and patient to pursue assistance.  -Due to Patient's income, she MAY qualify for LIS/Extra Help--an application was completed for her via telephone.  Follow Up Plan:    Route note to the RX Medication Assistance Team. Follow up on medication assistance in 1 month.  Beecher Mcardle, PharmD, BCACP Clinical Pharmacist [phone]

## 2023-09-18 DIAGNOSIS — K219 Gastro-esophageal reflux disease without esophagitis: Secondary | ICD-10-CM | POA: Diagnosis not present

## 2023-09-18 DIAGNOSIS — K625 Hemorrhage of anus and rectum: Secondary | ICD-10-CM | POA: Diagnosis not present

## 2023-09-18 DIAGNOSIS — D509 Iron deficiency anemia, unspecified: Secondary | ICD-10-CM | POA: Diagnosis not present

## 2023-09-18 DIAGNOSIS — Z860100 Personal history of colon polyps, unspecified: Secondary | ICD-10-CM | POA: Diagnosis not present

## 2023-09-19 ENCOUNTER — Ambulatory Visit: Admitting: Family Medicine

## 2023-09-19 NOTE — Telephone Encounter (Signed)
 Received Provider portion of PAP application for Faxiga (AZ&ME)- waiting on Pt to return app.

## 2023-09-26 NOTE — Telephone Encounter (Signed)
 Reached out to patient to see if She received PAP application for farxiga- she has not seen the application- so I mailed out another app.

## 2023-10-03 NOTE — Telephone Encounter (Signed)
 PAP: Application for Cindy Ross has been submitted to [employer] ([employer]), via fax

## 2023-10-06 DIAGNOSIS — I129 Hypertensive chronic kidney disease with stage 1 through stage 4 chronic kidney disease, or unspecified chronic kidney disease: Secondary | ICD-10-CM | POA: Diagnosis not present

## 2023-10-06 DIAGNOSIS — D649 Anemia, unspecified: Secondary | ICD-10-CM | POA: Diagnosis not present

## 2023-10-06 DIAGNOSIS — N1831 Chronic kidney disease, stage 3a: Secondary | ICD-10-CM | POA: Diagnosis not present

## 2023-10-06 DIAGNOSIS — E875 Hyperkalemia: Secondary | ICD-10-CM | POA: Diagnosis not present

## 2023-10-06 DIAGNOSIS — E1122 Type 2 diabetes mellitus with diabetic chronic kidney disease: Secondary | ICD-10-CM | POA: Diagnosis not present

## 2023-10-06 NOTE — Progress Notes (Addendum)
 Pharmacy Medication Assistance Program Note    10/06/2023  Patient ID: Cindy Ross, female   DOB: 01-12-1951, 73 y.o.   MRN: [id number]     10/06/2023  Outreach Medication One  Initial Outreach Date (Medication One) 09/17/2023  Type of Assistance Manufacturer Assistance  Date Application Sent to Patient 09/26/2023  Application Items Requested Application  Date Application Sent to Prescriber 09/19/2023  Name of Prescriber Cleave Curling  Date Application Received From Patient 10/03/2023  Application Items Received From Patient Application  Date Application Received From Provider 09/22/2023  Date Application Submitted to Manufacturer 10/03/2023  Method Application Sent to Manufacturer Fax  Patient Assistance Determination Approved  Patient Notification Method Telephone Call  Telephone Call Outcome Successful   Allow 7-10 Business days for shipment processed from 10/06/2023 Approved until 06/16/2024

## 2023-10-06 NOTE — Telephone Encounter (Signed)
 PAP: Patient assistance application for Farxiga has been approved by PAP Companies: AZ[employer] from 10/06/2023 to 06/16/2024. Medication should be delivered to PAP Delivery: Home. For further shipping updates, please contact [employer] ([employer]) at [phone]. Patient ID is: PEP_ID [id number]

## 2023-10-07 LAB — LAB REPORT - SCANNED
Creatinine, POC: 61.7 mg/dL
EGFR: 49

## 2023-10-17 ENCOUNTER — Telehealth: Payer: Self-pay | Admitting: Pharmacist

## 2023-10-17 DIAGNOSIS — E1122 Type 2 diabetes mellitus with diabetic chronic kidney disease: Secondary | ICD-10-CM

## 2023-10-17 NOTE — Progress Notes (Signed)
   10/17/2023  Patient ID: Cindy Ross, female   DOB: 1951/03/17, 73 y.o.   MRN: [id number]  Called Patient and spoke with her sister Cindy Ross. HIPAA identifiers were obtained.  Patient was approved for Farxiga 10 mg 1 tablet daily until 06/16/24.  Although approved, Patient's sister said the medication has not arrived yet.  Plan:  Follow up with Patient and sister in 2-3 weeks. Patient's Sister was instructed to give me a call if she does not receive the medication within the next 1-2 weeks.   Geronimo Krabbe, PharmD, BCACP Clinical Pharmacist [phone]

## 2023-10-20 ENCOUNTER — Ambulatory Visit
Admission: RE | Admit: 2023-10-20 | Discharge: 2023-10-20 | Disposition: A | Discharge status: Home / Self Care | Source: Ambulatory Visit | Attending: Internal Medicine | Admitting: Internal Medicine

## 2023-10-20 DIAGNOSIS — Z0000 Encounter for general adult medical examination without abnormal findings: Secondary | ICD-10-CM

## 2023-10-20 DIAGNOSIS — Z1231 Encounter for screening mammogram for malignant neoplasm of breast: Secondary | ICD-10-CM | POA: Diagnosis not present

## 2023-10-26 DIAGNOSIS — H5789 Other specified disorders of eye and adnexa: Secondary | ICD-10-CM | POA: Diagnosis not present

## 2023-10-26 DIAGNOSIS — H11002 Unspecified pterygium of left eye: Secondary | ICD-10-CM | POA: Diagnosis not present

## 2023-11-03 DIAGNOSIS — K2970 Gastritis, unspecified, without bleeding: Secondary | ICD-10-CM | POA: Diagnosis not present

## 2023-11-03 DIAGNOSIS — R195 Other fecal abnormalities: Secondary | ICD-10-CM | POA: Diagnosis not present

## 2023-11-03 DIAGNOSIS — K2950 Unspecified chronic gastritis without bleeding: Secondary | ICD-10-CM | POA: Diagnosis not present

## 2023-11-03 DIAGNOSIS — D509 Iron deficiency anemia, unspecified: Secondary | ICD-10-CM | POA: Diagnosis not present

## 2023-11-03 DIAGNOSIS — K219 Gastro-esophageal reflux disease without esophagitis: Secondary | ICD-10-CM | POA: Diagnosis not present

## 2023-11-06 ENCOUNTER — Telehealth: Payer: Self-pay | Admitting: Pharmacist

## 2023-11-06 DIAGNOSIS — E1165 Type 2 diabetes mellitus with hyperglycemia: Secondary | ICD-10-CM

## 2023-11-06 NOTE — Progress Notes (Signed)
   11/06/2023  Patient ID: Cindy Ross, female   DOB: 03/13/1951, 73 y.o.   MRN: [id number]  Called and spoke with Patient's sister. She confirmed receipt of Farxiga through [employer].  Patient's A1c-5.9%  On Atorvastatin  20 mg 1 tablet daily LDL ---82 TRIG 215   Follow up Upcoming appt 11/25/23 follow up on elevated triglycerides.   Geronimo Krabbe, PharmD, BCACP Clinical Pharmacist [phone]

## 2023-11-25 ENCOUNTER — Ambulatory Visit (INDEPENDENT_AMBULATORY_CARE_PROVIDER_SITE_OTHER): Payer: Self-pay | Admitting: Internal Medicine

## 2023-11-25 ENCOUNTER — Encounter: Payer: Self-pay | Admitting: Internal Medicine

## 2023-11-25 VITALS — BP 110/70 | HR 86 | Temp 98.4°F | Ht 65.0 in | Wt 127.0 lb

## 2023-11-25 DIAGNOSIS — E1122 Type 2 diabetes mellitus with diabetic chronic kidney disease: Secondary | ICD-10-CM | POA: Diagnosis not present

## 2023-11-25 DIAGNOSIS — W010XXA Fall on same level from slipping, tripping and stumbling without subsequent striking against object, initial encounter: Secondary | ICD-10-CM | POA: Insufficient documentation

## 2023-11-25 DIAGNOSIS — M810 Age-related osteoporosis without current pathological fracture: Secondary | ICD-10-CM

## 2023-11-25 DIAGNOSIS — N1831 Chronic kidney disease, stage 3a: Secondary | ICD-10-CM

## 2023-11-25 DIAGNOSIS — I129 Hypertensive chronic kidney disease with stage 1 through stage 4 chronic kidney disease, or unspecified chronic kidney disease: Secondary | ICD-10-CM

## 2023-11-25 DIAGNOSIS — M25561 Pain in right knee: Secondary | ICD-10-CM | POA: Diagnosis not present

## 2023-11-25 LAB — CBC
Hematocrit: 33.6 % — ABNORMAL LOW (ref 34.0–46.6)
Hemoglobin: 10.8 g/dL — ABNORMAL LOW (ref 11.1–15.9)
MCH: 31.3 pg (ref 26.6–33.0)
MCHC: 32.1 g/dL (ref 31.5–35.7)
MCV: 97 fL (ref 79–97)
Platelets: 222 10*3/uL (ref 150–450)
RBC: 3.45 x10E6/uL — ABNORMAL LOW (ref 3.77–5.28)
RDW: 12.6 % (ref 11.7–15.4)
WBC: 5 10*3/uL (ref 3.4–10.8)

## 2023-11-25 LAB — CMP14+EGFR
ALT: 24 IU/L (ref 0–32)
AST: 29 IU/L (ref 0–40)
Albumin: 4.5 g/dL (ref 3.8–4.8)
Alkaline Phosphatase: 60 IU/L (ref 44–121)
BUN/Creatinine Ratio: 25 (ref 12–28)
BUN: 29 mg/dL — ABNORMAL HIGH (ref 8–27)
Bilirubin Total: 0.4 mg/dL (ref 0.0–1.2)
CO2: 19 mmol/L — ABNORMAL LOW (ref 20–29)
Calcium: 9 mg/dL (ref 8.7–10.3)
Chloride: 105 mmol/L (ref 96–106)
Creatinine, Ser: 1.16 mg/dL — ABNORMAL HIGH (ref 0.57–1.00)
Globulin, Total: 3.1 g/dL (ref 1.5–4.5)
Glucose: 96 mg/dL (ref 70–99)
Potassium: 4.3 mmol/L (ref 3.5–5.2)
Sodium: 139 mmol/L (ref 134–144)
Total Protein: 7.6 g/dL (ref 6.0–8.5)
eGFR: 50 mL/min/{1.73_m2} — ABNORMAL LOW (ref >59)

## 2023-11-25 LAB — HEMOGLOBIN A1C
Est. average glucose Bld gHb Est-mCnc: 131 mg/dL
Hgb A1c MFr Bld: 6.2 % — ABNORMAL HIGH (ref 4.8–5.6)

## 2023-11-25 NOTE — Assessment & Plan Note (Signed)
 Chronic, well controlled. She will continue with olmesartan /hydrochlorothiazide daily.  - Encouraged to follow low sodium diet - Check renal function today

## 2023-11-25 NOTE — Progress Notes (Addendum)
 I,Cindy Ross, CMA,acting as a Neurosurgeon for Cindy Dung, MD.,have documented all relevant documentation on the behalf of Cindy Dung, MD,as directed by  Cindy Dung, MD while in the presence of Cindy Dung, MD.  Subjective:  Patient ID: Cindy Ross , female    DOB: 05/25/1951 , 73 y.o.   MRN: [id number]  Chief Complaint  Patient presents with   Diabetes    Patient presents today for a med check, Patient states the medication has been working well for her, makes her pee a lot. Patient doesn't have any specific questions or concerns. Denies headaches, chest pain & sob.    Hypertension    HPI Discussed the use of AI scribe software for clinical note transcription with the patient, who gave verbal consent to proceed.  History of Present Illness Cindy Ross is a 73 year old female with diabetes and hypertension who presents for follow-up.  Blood sugar levels are consistently between 108 and 110 mg/dL. She is taking metformin  1000 mg twice daily and Farxiga daily for diabetes management. Farxiga has been effective in lowering her blood sugar levels. She ensures adequate hydration.  For hypertension, she is on olmesartan  and hydrochlorothiazide. She also takes aspirin  and atorvastatin  20 mg daily for cardiovascular health.  She experienced a fall two weeks ago, tripping over a rug at home, resulting in knee pain. She did not seek medical attention or use any pain relief measures, despite her sister's suggestion.  She receives Prolia  injections twice a year for bone health and takes an iron supplement and vitamin D .   Diabetes She presents for her follow-up diabetic visit. She has type 2 diabetes mellitus. There are no hypoglycemic associated symptoms. Pertinent negatives for diabetes include no blurred vision, no polydipsia, no polyphagia and no polyuria. There are no hypoglycemic complications. She is compliant with treatment most of the time. She is following a diabetic diet.  When asked about meal planning, she reported none. She participates in exercise intermittently. Her home blood glucose trend is fluctuating minimally. An ACE inhibitor/angiotensin II receptor blocker is being taken. Eye exam is not current.  Hypertension This is a chronic problem. The current episode started more than 1 year ago. The problem has been gradually improving since onset. The problem is controlled. Pertinent negatives include no blurred vision or palpitations. Risk factors for coronary artery disease include diabetes mellitus, dyslipidemia and post-menopausal state. The current treatment provides moderate improvement.     Past Medical History:  Diagnosis Date   Diabetes mellitus without complication (HCC)    Hyperlipidemia 01/30/2016   Hypertension 2007   Respiratory crackles at left lung base 05/19/2017   CXR 02/2017 in Care Everywhere/[facility] normal     Family History  Problem Relation Age of Onset   Hyperlipidemia Sister    Diabetes Sister    Hypertension Sister    Heart disease Brother    Healthy Son    Leukemia Brother      Current Outpatient Medications:    Accu-Chek Neurosurgeon MISC, : Use as directed to check blood sugars 2 times per day dx:e11.22, Disp: 300 each, Rfl: 2   ACCU-CHEK GUIDE test strip, Use as instructed, Disp: 200 strip, Rfl: 4   aspirin  EC 81 MG tablet, Take 1 tablet (81 mg total) by mouth daily., Disp: , Rfl:    atorvastatin  (LIPITOR) 20 MG tablet, Take 1 tablet (20 mg total) by mouth daily., Disp: 90 tablet, Rfl: 1   Blood Glucose Monitoring  Suppl (ACCU-CHEK GUIDE) w/Device KIT, : Use as directed to check blood sugars 2 times per day dx:e11.22, Disp: 1 kit, Rfl: 1   calcium  carbonate (OSCAL) 1500 (600 Ca) MG TABS tablet, Take 600 mg of elemental calcium  by mouth daily., Disp: , Rfl:    cholecalciferol (VITAMIN D ) 1000 units tablet, Take 1,000 Units by mouth daily., Disp: , Rfl:    dapagliflozin propanediol (FARXIGA) 10 MG TABS tablet, Take 10 mg  by mouth daily., Disp: , Rfl:    denosumab  (PROLIA ) 60 MG/ML SOSY injection, Inject 60 mg into the skin every 6 (six) months. Courier to rhe[facility]: [address], [address], [place] [address]. Appt on 09/16/23, Disp: 1 mL, Rfl: 0   Glucosamine HCl (GLUCOSAMINE PO), Take by mouth daily., Disp: , Rfl:    hydrocortisone  (ANUSOL -HC) 2.5 % rectal cream, Place 1 Application rectally 2 (two) times daily. As needed, Disp: 30 g, Rfl: 0   metFORMIN  (GLUCOPHAGE ) 1000 MG tablet, Take 1 tablet (1,000 mg total) by mouth 2 (two) times daily., Disp: 180 tablet, Rfl: 2   Multiple Vitamins-Minerals (ONE-A-DAY WOMENS 50+ ADVANTAGE PO), Take 1 tablet by mouth daily at 12 noon., Disp: , Rfl:    olmesartan -hydrochlorothiazide (BENICAR  HCT) 40-12.5 MG tablet, Take 1 tablet by mouth daily., Disp: 90 tablet, Rfl: 1   omeprazole (PRILOSEC) 40 MG capsule, Take 40 mg by mouth every morning., Disp: , Rfl:    CLENPIQ 10-3.5-12 MG-GM -GM/160ML SOLN, , Disp: , Rfl:   Current Facility-Administered Medications:    [START ON 03/14/2024] denosumab  (PROLIA ) injection 60 mg, 60 mg, Subcutaneous, Q6 months,    No Known Allergies   Review of Systems  Constitutional: Negative.   Eyes:  Negative for blurred vision.  Respiratory: Negative.    Cardiovascular: Negative.  Negative for palpitations.  Gastrointestinal: Negative.   Endocrine: Negative for polydipsia, polyphagia and polyuria.  Musculoskeletal:  Positive for arthralgias.  Neurological: Negative.   Psychiatric/Behavioral: Negative.       Today's Vitals   11/25/23 0827  BP: 110/70  Pulse: 86  Temp: 98.4 F (36.9 C)  SpO2: 98%  Weight: 127 lb (57.6 kg)  Height: 5\' 5"  (1.651 m)   Body mass index is 21.13 kg/m.  Wt Readings from Last 3 Encounters:  11/25/23 127 lb (57.6 kg)  09/16/23 125 lb 9.6 oz (57 kg)  09/10/23 128 lb (58.1 kg)    The 10-year ASCVD risk score (Arnett DK, et al., 2019) is: 19.4%   Values used to calculate the score:     Age: 89 years      Sex: Female     Is Non-Hispanic African American: No     Diabetic: Yes     Tobacco smoker: No     Systolic Blood Pressure: 110 mmHg     Is BP treated: Yes     HDL Cholesterol: 33 mg/dL     Total Cholesterol: 151 mg/dL  Objective:  Physical Exam Vitals and nursing note reviewed.  Constitutional:      Appearance: Normal appearance.  HENT:     Head: Normocephalic and atraumatic.  Eyes:     Extraocular Movements: Extraocular movements intact.  Cardiovascular:     Rate and Rhythm: Normal rate and regular rhythm.     Heart sounds: Normal heart sounds.  Pulmonary:     Effort: Pulmonary effort is normal.     Breath sounds: Normal breath sounds.  Musculoskeletal:        General: Tenderness present.     Comments:  Crepitus r knee  Skin:    General: Skin is warm.  Neurological:     General: No focal deficit present.     Mental Status: She is alert.  Psychiatric:        Mood and Affect: Mood normal.        Behavior: Behavior normal.       Assessment And Plan:  Type 2 diabetes mellitus with stage 3a chronic kidney disease, without long-term current use of insulin (HCC) Assessment & Plan: Chronic, blood glucose levels stable with metformin  and Farxiga. Advised on hydration due to Farxiga's diuretic effect. - Continue metformin  1000 mg twice daily. - Continue Farxiga. - Advise to maintain adequate hydration.  Orders: -     CBC -     CMP14+EGFR -     Hemoglobin A1c  Parenchymal renal hypertension, stage 1 through stage 4 or unspecified chronic kidney disease Assessment & Plan: Chronic, well controlled. She will continue with olmesartan /hydrochlorothiazide daily.  - Encouraged to follow low sodium diet - Check renal function today   Fall from slip, trip, or stumble, initial encounter Assessment & Plan: She admits she fell 2 weeks ago, tripped over a throw rug in her home.  - Encouraged to remove all throw rugs, cords and loose items from her floors - Ensure all rooms are  well lit - Use railings on stairs   Acute pain of right knee Assessment & Plan: Knee pain post-fall with crepitus noted. - Recommend acetaminophen  for pain. - Advise use of topical analgesic on knee. - Refer to orthopedic specialist for evaluation and x-ray.  Orders: -     Ambulatory referral to Orthopedic Surgery  Age-related osteoporosis without current pathological fracture Assessment & Plan: Chronic, Rheumatology input is appreciated.  - Continue with Prolia  every 6 months as per Rheum - Continue with calcium /vitamin D  supplementation - Continue walking daily    Return if symptoms worsen or fail to improve.  Patient was given opportunity to ask questions. Patient verbalized understanding of the plan and was able to repeat key elements of the plan. All questions were answered to their satisfaction.    I, Cindy Dung, MD, have reviewed all documentation for this visit. The documentation on 11/25/23 for the exam, diagnosis, procedures, and orders are all accurate and complete.   IF YOU HAVE BEEN REFERRED TO A SPECIALIST, IT MAY TAKE 1-2 WEEKS TO SCHEDULE/PROCESS THE REFERRAL. IF YOU HAVE NOT HEARD FROM US /SPECIALIST IN TWO WEEKS, PLEASE GIVE US  A CALL AT [phone] X [phone].

## 2023-11-25 NOTE — Assessment & Plan Note (Signed)
 She admits she fell 2 weeks ago, tripped over a throw rug in her home.  - Encouraged to remove all throw rugs, cords and loose items from her floors - Ensure all rooms are well lit - Use railings on stairs

## 2023-11-25 NOTE — Assessment & Plan Note (Signed)
 Chronic, Rheumatology input is appreciated.  - Continue with Prolia  every 6 months as per Rheum - Continue with calcium /vitamin D  supplementation - Continue walking daily

## 2023-11-25 NOTE — Assessment & Plan Note (Signed)
 Chronic, blood glucose levels stable with metformin  and Farxiga. Advised on hydration due to Farxiga's diuretic effect. - Continue metformin  1000 mg twice daily. - Continue Farxiga. - Advise to maintain adequate hydration.

## 2023-11-25 NOTE — Assessment & Plan Note (Signed)
 Knee pain post-fall with crepitus noted. - Recommend acetaminophen  for pain. - Advise use of topical analgesic on knee. - Refer to orthopedic specialist for evaluation and x-ray.

## 2023-11-26 ENCOUNTER — Ambulatory Visit: Payer: Self-pay | Admitting: Internal Medicine

## 2023-11-27 DIAGNOSIS — D509 Iron deficiency anemia, unspecified: Secondary | ICD-10-CM | POA: Diagnosis not present

## 2023-11-27 DIAGNOSIS — K625 Hemorrhage of anus and rectum: Secondary | ICD-10-CM | POA: Diagnosis not present

## 2023-11-27 DIAGNOSIS — K641 Second degree hemorrhoids: Secondary | ICD-10-CM | POA: Diagnosis not present

## 2023-11-27 DIAGNOSIS — Z860100 Personal history of colon polyps, unspecified: Secondary | ICD-10-CM | POA: Diagnosis not present

## 2023-11-27 DIAGNOSIS — K219 Gastro-esophageal reflux disease without esophagitis: Secondary | ICD-10-CM | POA: Diagnosis not present

## 2023-12-09 DIAGNOSIS — R197 Diarrhea, unspecified: Secondary | ICD-10-CM | POA: Diagnosis not present

## 2024-01-29 DIAGNOSIS — E113293 Type 2 diabetes mellitus with mild nonproliferative diabetic retinopathy without macular edema, bilateral: Secondary | ICD-10-CM | POA: Diagnosis not present

## 2024-01-29 LAB — HM DIABETES EYE EXAM

## 2024-02-24 ENCOUNTER — Other Ambulatory Visit (HOSPITAL_COMMUNITY): Payer: Self-pay

## 2024-02-24 ENCOUNTER — Other Ambulatory Visit: Payer: Self-pay | Admitting: Pharmacy Technician

## 2024-02-24 ENCOUNTER — Other Ambulatory Visit: Payer: Self-pay

## 2024-02-24 ENCOUNTER — Telehealth: Payer: Self-pay | Admitting: Pharmacist

## 2024-02-24 DIAGNOSIS — M810 Age-related osteoporosis without current pathological fracture: Secondary | ICD-10-CM

## 2024-02-24 MED ORDER — JUBBONTI 60 MG/ML ~~LOC~~ SOSY
60.0000 mg | PREFILLED_SYRINGE | SUBCUTANEOUS | 0 refills | Status: AC
  Filled 2024-02-24: qty 1, fill #0
  Filled 2024-03-08: qty 1, 180d supply, fill #0

## 2024-02-24 NOTE — Telephone Encounter (Signed)
 Patient due for Jubbonti  (denosumab  biosimilar) on 03/19/2024. She has an appt on 04/01/2024 and will plan to administer Jubbonti  at this visit if she has labs updated prior to appt.  Called pt via interpreter [place], [phone]. Unable to reach. VM box not set up. Will continue to follow  Mehr Depaoli, PharmD, MPH, BCPS, CPP Clinical Pharmacist [facility])

## 2024-02-24 NOTE — Progress Notes (Signed)
 Confirmed with [facility]; we would not re-onboard. Routed to [place].

## 2024-02-26 NOTE — Telephone Encounter (Signed)
 ATC patient at both her mobile and home phone numbers via interpreter [place], [phone]. Unable to reach. VM box not set up. Will continue to follow up to inform patient we need updated labs prior to her appt so she can receive Jubbonti  (denosumab  biosimilar).   [place] Delauder C. Thadd Apuzzo [facility] PharmD Candidate Class of [id number]

## 2024-02-29 ENCOUNTER — Other Ambulatory Visit: Payer: Self-pay | Admitting: Internal Medicine

## 2024-03-01 ENCOUNTER — Other Ambulatory Visit: Payer: Self-pay | Admitting: Internal Medicine

## 2024-03-01 DIAGNOSIS — E7800 Pure hypercholesterolemia, unspecified: Secondary | ICD-10-CM

## 2024-03-05 ENCOUNTER — Other Ambulatory Visit: Payer: Self-pay

## 2024-03-08 ENCOUNTER — Other Ambulatory Visit: Payer: Self-pay

## 2024-03-08 NOTE — Progress Notes (Signed)
 Specialty Pharmacy Refill Coordination Note  Cindy Ross is a 73 y.o. female contacted today regarding refills of specialty medication(s) Denosumab -bbdz (Jubbonti )  Patient requested Courier to Provider Office   Delivery date: 03/25/24   Verified address: [facility] [address] [place], [place] [address]   Medication will be filled on 03/24/24. Appt on 10/16.  Attempted to acquire payment over the phone. Patient declined. Patient requested AR - was told that she could pay when bill received.  Co-Pay: $1.60  Spoke to patient's sister Giel.

## 2024-03-15 DIAGNOSIS — M810 Age-related osteoporosis without current pathological fracture: Secondary | ICD-10-CM | POA: Diagnosis not present

## 2024-03-15 LAB — HM DEXA SCAN

## 2024-03-15 NOTE — Telephone Encounter (Signed)
 ATC patient at her home number but it went straight to VM. VM box has not been set up. Called patient at her mobile number and spoke to her via interpreter [place] [phone]. Advised patient she needs to come in for labs by 10/14 so she can receive her Prolia  (Jubbonti ) at her appointment on 10/16.   Allyn Bartelson C. Madgie Dhaliwal [facility] PharmD Candidate Class of [phone]

## 2024-03-17 ENCOUNTER — Telehealth: Payer: Self-pay | Admitting: *Deleted

## 2024-03-17 ENCOUNTER — Ambulatory Visit

## 2024-03-17 DIAGNOSIS — Z0000 Encounter for general adult medical examination without abnormal findings: Secondary | ICD-10-CM | POA: Diagnosis not present

## 2024-03-17 NOTE — Telephone Encounter (Signed)
 Received DEXA results from [facility].  Date of Scan: 03/15/2024  Lowest T-score: -3.4  BMD: 0.467  Lowest site measured: Right Femoral Neck  DX: Osteoporosis  Significant changes in BMD and site measured (5% and above): n/a  Current Regimen: Calcium , Vitamin D , Prolia : last injection 09/16/2023  Recommendation: Discuss at follow up Visit   Reviewed by:Dr. Maya Nash   Next Appointment:  04/01/2024

## 2024-03-17 NOTE — Patient Instructions (Signed)
 Ms. Buist,  Thank you for taking the time for your Medicare Wellness Visit. I appreciate your continued commitment to your health goals. Please review the care plan we discussed, and feel free to reach out if I can assist you further.  Medicare recommends these wellness visits once per year to help you and your care team stay ahead of potential health issues. These visits are designed to focus on prevention, allowing your provider to concentrate on managing your acute and chronic conditions during your regular appointments.  Please note that Annual Wellness Visits do not include a physical exam. Some assessments may be limited, especially if the visit was conducted virtually. If needed, we may recommend a separate in-person follow-up with your provider.  Ongoing Care Seeing your primary care provider every 3 to 6 months helps us  monitor your health and provide consistent, personalized care.   Referrals If a referral was made during today's visit and you haven't received any updates within two weeks, please contact the referred provider directly to check on the status.  Recommended Screenings:  Health Maintenance  Topic Date Due   Flu Shot  01/16/2024   COVID-19 Vaccine (5 - 2025-26 season) 02/16/2024   Yearly kidney health urinalysis for diabetes  03/13/2024   Hemoglobin A1C  05/26/2024   Complete foot exam   07/23/2024   Yearly kidney function blood test for diabetes  11/24/2024   Eye exam for diabetics  01/28/2025   Medicare Annual Wellness Visit  03/17/2025   Colon Cancer Screening  03/19/2025   Breast Cancer Screening  10/19/2025   DTaP/Tdap/Td vaccine (4 - Td or Tdap) 01/29/2026   Pneumococcal Vaccine for age over 64  Completed   DEXA scan (bone density measurement)  Completed   Hepatitis C Screening  Completed   Zoster (Shingles) Vaccine  Completed   HPV Vaccine  Aged Out   Meningitis B Vaccine  Aged Out       03/17/2024    4:22 PM  Advanced Directives  Does Patient Have a  Medical Advance Directive? No   Advance Care Planning is important because it: Ensures you receive medical care that aligns with your values, goals, and preferences. Provides guidance to your family and loved ones, reducing the emotional burden of decision-making during critical moments.  Vision: Annual vision screenings are recommended for early detection of glaucoma, cataracts, and diabetic retinopathy. These exams can also reveal signs of chronic conditions such as diabetes and high blood pressure.  Dental: Annual dental screenings help detect early signs of oral cancer, gum disease, and other conditions linked to overall health, including heart disease and diabetes.  Please see the attached documents for additional preventive care recommendations.

## 2024-03-17 NOTE — Progress Notes (Signed)
 Subjective:   DANECIA UNDERDOWN is a 73 y.o. who presents for a Medicare Wellness preventive visit.  As a reminder, Annual Wellness Visits don't include a physical exam, and some assessments may be limited, especially if this visit is performed virtually. We may recommend an in-person follow-up visit with your provider if needed.  Visit Complete: Virtual I connected with  Leann N Grist on 03/17/24 by a audio enabled telemedicine application and verified that I am speaking with the correct person using two identifiers.  Patient Location: Home  Provider Location: Office/Clinic  I discussed the limitations of evaluation and management by telemedicine. The patient expressed understanding and agreed to proceed.  Vital Signs: Because this visit was a virtual/telehealth visit, some criteria may be missing or patient reported. Any vitals not documented were not able to be obtained and vitals that have been documented are patient reported.  VideoError- Librarian, academic were attempted between this provider and patient, however failed, due to patient having technical difficulties OR patient did not have access to video capability.  We continued and completed visit with audio only.   Persons Participating in Visit: Patient assisted by interpreter.  AWV Questionnaire: No: Patient Medicare AWV questionnaire was not completed prior to this visit.  Cardiac Risk Factors include: advanced age (>11men, >67 women);diabetes mellitus;dyslipidemia;hypertension     Objective:    Today's Vitals   There is no height or weight on file to calculate BMI.     03/17/2024    4:22 PM 03/13/2023    8:52 AM 02/28/2022    8:24 AM 02/22/2021    9:34 AM 01/20/2020   12:25 PM 12/15/2018    8:59 AM 11/18/2016    9:15 AM  Advanced Directives  Does Patient Have a Medical Advance Directive? No No No No No No No   Would patient like information on creating a medical advance directive?  No - Patient declined    No - Patient declined Yes (MAU/Ambulatory/Procedural Areas - Information given)  Yes (MAU/Ambulatory/Procedural Areas - Information given)      Data saved with a previous flowsheet row definition    Current Medications (verified) Outpatient Encounter Medications as of 03/17/2024  Medication Sig   Accu-Chek FastClix Lancets MISC : Use as directed to check blood sugars 2 times per day dx:e11.22   ACCU-CHEK GUIDE test strip Use as instructed   aspirin  EC 81 MG tablet Take 1 tablet (81 mg total) by mouth daily.   atorvastatin  (LIPITOR) 20 MG tablet TAKE 1 TABLET BY MOUTH EVERY DAY   Blood Glucose Monitoring Suppl (ACCU-CHEK GUIDE) w/Device KIT : Use as directed to check blood sugars 2 times per day dx:e11.22   calcium  carbonate (OSCAL) 1500 (600 Ca) MG TABS tablet Take 600 mg of elemental calcium  by mouth daily.   cholecalciferol (VITAMIN D ) 1000 units tablet Take 1,000 Units by mouth daily.   dapagliflozin propanediol (FARXIGA) 10 MG TABS tablet Take 10 mg by mouth daily.   denosumab -bbdz (JUBBONTI ) 60 MG/ML SOSY Inject 60 mg into the skin every 6 (six) months. Courier to rheum: [address], [place] [address]. Appt on 03/19/2024   Glucosamine HCl (GLUCOSAMINE PO) Take by mouth daily.   hydrocortisone  (ANUSOL -HC) 2.5 % rectal cream Place 1 Application rectally 2 (two) times daily. As needed   metFORMIN  (GLUCOPHAGE ) 1000 MG tablet Take 1 tablet (1,000 mg total) by mouth 2 (two) times daily.   Multiple Vitamins-Minerals (ONE-A-DAY WOMENS 50+ ADVANTAGE PO) Take 1 tablet by  mouth daily at 12 noon.   olmesartan -hydrochlorothiazide (BENICAR  HCT) 40-12.5 MG tablet TAKE 1 TABLET BY MOUTH EVERY DAY   omeprazole (PRILOSEC) 40 MG capsule Take 40 mg by mouth every morning.   CLENPIQ 10-3.5-12 MG-GM -GM/160ML SOLN  (Patient not taking: Reported on 03/17/2024)   Facility-Administered Encounter Medications as of 03/17/2024  Medication   denosumab  (PROLIA ) injection 60 mg    Allergies  (verified) Patient has no known allergies.   History: Past Medical History:  Diagnosis Date   Diabetes mellitus without complication (HCC)    Hyperlipidemia 01/30/2016   Hypertension 2007   Respiratory crackles at left lung base 05/19/2017   CXR 02/2017 in Care [facility] normal   History reviewed. No pertinent surgical history. Family History  Problem Relation Age of Onset   Hyperlipidemia Sister    Diabetes Sister    Hypertension Sister    Heart disease Brother    Healthy Son    Leukemia Brother    Social History   Socioeconomic History   Marital status: Widowed    Spouse name: Not on file   Number of children: 1   Years of education: 66   Highest education level: Not on file  Occupational History   Occupation: house cleaning.    Comment: Worked for bakery in past   Occupation: retired  Tobacco Use   Smoking status: Never    Passive exposure: Never   Smokeless tobacco: Never  Vaping Use   Vaping status: Never Used  Substance and Sexual Activity   Alcohol use: No    Alcohol/week: 0.0 standard drinks of alcohol   Drug use: No   Sexual activity: Not Currently  Other Topics Concern   Not on file  Social History Narrative   Originally from [place]   Came to [employer]. In 48   Lives with younger sister, Amela Handley   Son lives in [place] .  Works in [employer].     Social Drivers of Corporate investment banker Strain: Low Risk  (03/17/2024)   Overall Financial Resource Strain (CARDIA)    Difficulty of Paying Living Expenses: Not hard at all  Food Insecurity: No Food Insecurity (03/17/2024)   Hunger Vital Sign    Worried About Running Out of Food in the Last Year: Never true    Ran Out of Food in the Last Year: Never true  Transportation Needs: No Transportation Needs (03/17/2024)   PRAPARE - Administrator, Civil Service (Medical): No    Lack of Transportation (Non-Medical): No  Physical Activity: Sufficiently Active (03/17/2024)   Exercise  Vital Sign    Days of Exercise per Week: 7 days    Minutes of Exercise per Session: 60 min  Stress: No Stress Concern Present (03/17/2024)   [employer] of Occupational Health - Occupational Stress Questionnaire    Feeling of Stress: Not at all  Social Connections: Socially Isolated (03/17/2024)   Social Connection and Isolation Panel    Frequency of Communication with Friends and Family: Once a week    Frequency of Social Gatherings with Friends and Family: More than three times a week    Attends Religious Services: Never    Database administrator or Organizations: No    Attends Banker Meetings: Never    Marital Status: Widowed    Tobacco Counseling Counseling given: Not Answered    Clinical Intake:  Pre-visit preparation completed: Yes  Pain : No/denies pain     Nutritional Risks: None Diabetes:  Yes CBG done?: No Did pt. bring in CBG monitor from home?: No  Lab Results  Component Value Date   HGBA1C 6.2 (H) 11/25/2023   HGBA1C 5.9 (H) 07/24/2023   HGBA1C 5.8 (H) 03/13/2023     How often do you need to have someone help you when you read instructions, pamphlets, or other written materials from your doctor or pharmacy?: 3 - Sometimes  Interpreter Needed?: Yes Interpreter Agency: [employer] Interpreter Name: Jenkins Ames ID: [id number]  Information entered by :: NAllen LPN   Activities of Daily Living     03/17/2024    4:14 PM  In your present state of health, do you have any difficulty performing the following activities:  Hearing? 0  Vision? 0  Difficulty concentrating or making decisions? 0  Walking or climbing stairs? 0  Dressing or bathing? 0  Doing errands, shopping? 0  Preparing Food and eating ? N  Using the Toilet? N  In the past six months, have you accidently leaked urine? N  Do you have problems with loss of bowel control? N  Managing your Medications? N  Managing your Finances? N  Housekeeping or managing your  Housekeeping? N    Patient Care Team: Jarold Medici, MD as PCP - General (Internal Medicine) Abigail, Maude POUR [facility])  I have updated your Care Teams any recent Medical Services you may have received from other providers in the past year.     Assessment:   This is a routine wellness examination for Guiselle.  Hearing/Vision screen Hearing Screening - Comments:: Denies hearing issues Vision Screening - Comments:: Regular eye exams, Dr. Maude Abigail   Goals Addressed             This Visit's Progress    Patient Stated       03/17/2024, denies goals       Depression Screen     03/17/2024    4:24 PM 11/25/2023    8:28 AM 09/10/2023    9:35 AM 03/13/2023    8:53 AM 05/21/2022    9:10 AM 02/28/2022    8:26 AM 02/22/2021    9:36 AM  PHQ 2/9 Scores  PHQ - 2 Score 0 0 0 0 0 0 0  PHQ- 9 Score 0 0 0 0       Fall Risk     03/17/2024    4:23 PM 11/25/2023    8:28 AM 07/24/2023    8:35 AM 03/13/2023    8:53 AM 05/21/2022    9:10 AM  Fall Risk   Falls in the past year? 0 1 0 0 1  Number falls in past yr: 0 0 0 0 0  Injury with Fall? 0 1 0 0 1  Risk for fall due to : Medication side effect No Fall Risks No Fall Risks Medication side effect No Fall Risks  Follow up Falls evaluation completed;Falls prevention discussed Falls evaluation completed Falls evaluation completed Falls prevention discussed;Falls evaluation completed Falls evaluation completed      Data saved with a previous flowsheet row definition    MEDICARE RISK AT HOME:  Medicare Risk at Home Any stairs in or around the home?: Yes If so, are there any without handrails?: No Home free of loose throw rugs in walkways, pet beds, electrical cords, etc?: Yes Adequate lighting in your home to reduce risk of falls?: Yes Life alert?: No Use of a cane, walker or w/c?: No Grab bars in the bathroom?: Yes Shower chair or bench in  shower?: Yes Elevated toilet seat or a handicapped toilet?: No  TIMED UP AND GO:  Was the test  performed?  No  Cognitive Function: Unable: Due to language barrier, hearing or vision limitations or other  visit was completed with the help of an interpreter     06/02/2018    9:55 AM  MMSE - Mini Mental State Exam  Orientation to time 5  Orientation to Place 5  Attention/ Calculation 5  Language- name 2 objects 2  Language- repeat 1  Language- read & follow direction 1  Write a sentence 1  Copy design 1        03/13/2023    8:54 AM 06/02/2018   10:25 AM  6CIT Screen  What Year? 0 points 0 points  What month? 0 points 0 points  What time? 0 points 0 points  Count back from 20 0 points 0 points  Months in reverse 0 points 0 points  Repeat phrase 10 points 0 points  Total Score 10 points 0 points    Immunizations Immunization History  Administered Date(s) Administered   Fluad Quad(high Dose 65+) 04/18/2020, 04/02/2021, 02/28/2022   Fluad Trivalent(High Dose 65+) 03/13/2023   INFLUENZA, HIGH DOSE SEASONAL PF 04/30/2018, 03/30/2019   Influenza Inj Mdck Quad Pf 07/31/2016, 05/19/2017   Influenza-Unspecified 04/17/2018, 03/30/2019   PFIZER Comirnaty(Gray Top)Covid-19 Tri-Sucrose Vaccine 12/14/2020   PFIZER(Purple Top)SARS-COV-2 Vaccination 07/23/2019, 08/13/2019, 03/14/2020   PNEUMOCOCCAL CONJUGATE-20 08/31/2020   Pneumococcal Conjugate-13 09/14/2018, 12/29/2018   Pneumococcal Polysaccharide-23 03/19/2016   Td 10/31/1994, 04/10/1995   Tdap 01/30/2016   Zoster Recombinant(Shingrix) 05/06/2019, 07/12/2019    Screening Tests Health Maintenance  Topic Date Due   Influenza Vaccine  01/16/2024   COVID-19 Vaccine (5 - 2025-26 season) 02/16/2024   Diabetic kidney evaluation - Urine ACR  03/13/2024   HEMOGLOBIN A1C  05/26/2024   FOOT EXAM  07/23/2024   Diabetic kidney evaluation - eGFR measurement  11/24/2024   OPHTHALMOLOGY EXAM  01/28/2025   Medicare Annual Wellness (AWV)  03/17/2025   Colonoscopy  03/19/2025   Mammogram  10/19/2025   DTaP/Tdap/Td (4 - Td or Tdap)  01/29/2026   Pneumococcal Vaccine: 50+ Years  Completed   DEXA SCAN  Completed   Hepatitis C Screening  Completed   Zoster Vaccines- Shingrix  Completed   HPV VACCINES  Aged Out   Meningococcal B Vaccine  Aged Out    Health Maintenance Items Addressed: Has appointment with PCP tomorrow.  Additional Screening:  Vision Screening: Recommended annual ophthalmology exams for early detection of glaucoma and other disorders of the eye. Is the patient up to date with their annual eye exam?  Yes  Who is the provider or what is the name of the office in which the patient attends annual eye exams? Dr. Abigail  Dental Screening: Recommended annual dental exams for proper oral hygiene  Community Resource Referral / Chronic Care Management: CRR required this visit?  No   CCM required this visit?  No   Plan:    I have personally reviewed and noted the following in the patient's chart:   Medical and social history Use of alcohol, tobacco or illicit drugs  Current medications and supplements including opioid prescriptions. Patient is not currently taking opioid prescriptions. Functional ability and status Nutritional status Physical activity Advanced directives List of other physicians Hospitalizations, surgeries, and ER visits in previous 12 months Vitals Screenings to include cognitive, depression, and falls Referrals and appointments  In addition, I have reviewed and discussed with patient  certain preventive protocols, quality metrics, and best practice recommendations. A written personalized care plan for preventive services as well as general preventive health recommendations were provided to patient.   Ardella FORBES Dawn, LPN   [id number]   After Visit Summary: (Pick Up) Due to this being a telephonic visit, with patients personalized plan was offered to patient and patient has requested to Pick up at office.  Notes: Nothing significant to report at this time.

## 2024-03-18 ENCOUNTER — Ambulatory Visit (INDEPENDENT_AMBULATORY_CARE_PROVIDER_SITE_OTHER): Payer: Medicare Other | Admitting: Internal Medicine

## 2024-03-18 ENCOUNTER — Encounter: Payer: Self-pay | Admitting: Internal Medicine

## 2024-03-18 ENCOUNTER — Ambulatory Visit: Payer: Medicare Other

## 2024-03-18 VITALS — BP 126/80 | HR 77 | Temp 98.1°F | Ht 65.0 in | Wt 126.2 lb

## 2024-03-18 DIAGNOSIS — E1122 Type 2 diabetes mellitus with diabetic chronic kidney disease: Secondary | ICD-10-CM

## 2024-03-18 DIAGNOSIS — I129 Hypertensive chronic kidney disease with stage 1 through stage 4 chronic kidney disease, or unspecified chronic kidney disease: Secondary | ICD-10-CM

## 2024-03-18 DIAGNOSIS — M810 Age-related osteoporosis without current pathological fracture: Secondary | ICD-10-CM

## 2024-03-18 DIAGNOSIS — Z23 Encounter for immunization: Secondary | ICD-10-CM

## 2024-03-18 DIAGNOSIS — N1831 Chronic kidney disease, stage 3a: Secondary | ICD-10-CM | POA: Diagnosis not present

## 2024-03-18 DIAGNOSIS — E7800 Pure hypercholesterolemia, unspecified: Secondary | ICD-10-CM

## 2024-03-18 DIAGNOSIS — M816 Localized osteoporosis [Lequesne]: Secondary | ICD-10-CM

## 2024-03-18 NOTE — Progress Notes (Signed)
 I,Victoria T Emmitt, CMA,acting as a Neurosurgeon for Catheryn LOISE Slocumb, MD.,have documented all relevant documentation on the behalf of Catheryn LOISE Slocumb, MD,as directed by  Catheryn LOISE Slocumb, MD while in the presence of Catheryn LOISE Slocumb, MD.  Subjective:  Patient ID: Cindy Ross , female    DOB: 04-29-1951 , 73 y.o.   MRN: [id number]  Chief Complaint  Patient presents with   Diabetes    She presents today for bp/dm check. She is accompanied by interpreter today. She reports compliance with her meds. She denies headaches, chest pain and shortness of breath.     Hypertension   Hyperlipidemia    HPI Discussed the use of AI scribe software for clinical note transcription with the patient, who gave verbal consent to proceed.  History of Present Illness Cindy Ross is a 73 year old female with diabetes who presents for a diabetes check.  Interpreter is present.   She has been monitoring her blood sugar levels, which are reported as normal in the mornings. She continues to take her diabetes medications, including Farxiga and metformin .  She is taking aspirin , olmesartan /hydrochlorothiazide for blood pressure, and omeprazole. She also receives Jubbontiy injections for osteoporosis, with the next injection scheduled for October 16th. She takes calcium  and vitamin D  supplements.  She has chronic kidney disease and is scheduled for a kidney test in the urine today. She did not eat breakfast this morning.  She received a flu shot today and is up to date with her eye exam, which was last done in August. Her last bone density test was in 2023, and she has a follow-up scheduled at [facility].   Diabetes She presents for her follow-up diabetic visit. She has type 2 diabetes mellitus. There are no hypoglycemic associated symptoms. Pertinent negatives for diabetes include no blurred vision, no polydipsia, no polyphagia and no polyuria. There are no hypoglycemic complications. She is compliant with treatment most of the  time. She is following a diabetic diet. When asked about meal planning, she reported none. She participates in exercise intermittently. Her home blood glucose trend is fluctuating minimally. An ACE inhibitor/angiotensin II receptor blocker is being taken. Eye exam is not current.  Hypertension This is a chronic problem. The current episode started more than 1 year ago. The problem has been gradually improving since onset. The problem is controlled. Pertinent negatives include no blurred vision or palpitations. Risk factors for coronary artery disease include diabetes mellitus, dyslipidemia and post-menopausal state. The current treatment provides moderate improvement.     Past Medical History:  Diagnosis Date   Diabetes mellitus without complication (HCC)    Hyperlipidemia 01/30/2016   Hypertension 2007   Respiratory crackles at left lung base 05/19/2017   CXR 02/2017 in [facility] normal     Family History  Problem Relation Age of Onset   Hyperlipidemia Sister    Diabetes Sister    Hypertension Sister    Heart disease Brother    Healthy Son    Leukemia Brother      Current Outpatient Medications:    Accu-Chek Neurosurgeon MISC, : Use as directed to check blood sugars 2 times per day dx:e11.22, Disp: 300 each, Rfl: 2   ACCU-CHEK GUIDE test strip, Use as instructed, Disp: 200 strip, Rfl: 4   aspirin  EC 81 MG tablet, Take 1 tablet (81 mg total) by mouth daily., Disp: , Rfl:    atorvastatin  (LIPITOR) 20 MG tablet, TAKE 1 TABLET BY MOUTH EVERY DAY, Disp: 90  tablet, Rfl: 1   Blood Glucose Monitoring Suppl (ACCU-CHEK GUIDE) w/Device KIT, : Use as directed to check blood sugars 2 times per day dx:e11.22, Disp: 1 kit, Rfl: 1   calcium  carbonate (OSCAL) 1500 (600 Ca) MG TABS tablet, Take 600 mg of elemental calcium  by mouth daily., Disp: , Rfl:    cholecalciferol (VITAMIN D ) 1000 units tablet, Take 1,000 Units by mouth daily., Disp: , Rfl:    dapagliflozin propanediol (FARXIGA)  10 MG TABS tablet, Take 10 mg by mouth daily., Disp: , Rfl:    denosumab -bbdz (JUBBONTI ) 60 MG/ML SOSY, Inject 60 mg into the skin every 6 (six) months. Courier to rheum: [address], [place] [address]. Appt on 03/19/2024, Disp: 1 mL, Rfl: 0   Glucosamine HCl (GLUCOSAMINE PO), Take by mouth daily., Disp: , Rfl:    hydrocortisone  (ANUSOL -HC) 2.5 % rectal cream, Place 1 Application rectally 2 (two) times daily. As needed, Disp: 30 g, Rfl: 0   metFORMIN  (GLUCOPHAGE ) 1000 MG tablet, Take 1 tablet (1,000 mg total) by mouth 2 (two) times daily., Disp: 180 tablet, Rfl: 2   Multiple Vitamins-Minerals (ONE-A-DAY WOMENS 50+ ADVANTAGE PO), Take 1 tablet by mouth daily at 12 noon., Disp: , Rfl:    olmesartan -hydrochlorothiazide (BENICAR  HCT) 40-12.5 MG tablet, TAKE 1 TABLET BY MOUTH EVERY DAY, Disp: 90 tablet, Rfl: 1   omeprazole (PRILOSEC) 40 MG capsule, Take 40 mg by mouth every morning., Disp: , Rfl:    CLENPIQ 10-3.5-12 MG-GM -GM/160ML SOLN, , Disp: , Rfl:   Current Facility-Administered Medications:    denosumab  (PROLIA ) injection 60 mg, 60 mg, Subcutaneous, Q6 months,    No Known Allergies   Review of Systems  Constitutional: Negative.   Eyes:  Negative for blurred vision.  Respiratory: Negative.    Cardiovascular: Negative.  Negative for palpitations.  Gastrointestinal: Negative.   Endocrine: Negative for polydipsia, polyphagia and polyuria.  Neurological: Negative.   Psychiatric/Behavioral: Negative.       Today's Vitals   03/18/24 0929  BP: 126/80  Pulse: 77  Temp: 98.1 F (36.7 C)  SpO2: 98%  Weight: 126 lb 3.2 oz (57.2 kg)  Height: 5' 5 (1.651 m)   Body mass index is 21 kg/m.  Wt Readings from Last 3 Encounters:  03/18/24 126 lb 3.2 oz (57.2 kg)  11/25/23 127 lb (57.6 kg)  09/16/23 125 lb 9.6 oz (57 kg)     Objective:  Physical Exam Constitutional:      Appearance: Normal appearance.  HENT:     Head: Normocephalic and atraumatic.  Eyes:      Extraocular Movements: Extraocular movements intact.  Cardiovascular:     Rate and Rhythm: Normal rate and regular rhythm.  Pulmonary:     Effort: Pulmonary effort is normal.     Breath sounds: Normal breath sounds.  Musculoskeletal:     Cervical back: Normal range of motion.  Skin:    General: Skin is warm and dry.  Neurological:     General: No focal deficit present.     Mental Status: She is alert and oriented to person, place, and time.  Psychiatric:        Mood and Affect: Mood normal.        Behavior: Behavior normal.         Assessment And Plan:  Parenchymal renal hypertension, stage 1 through stage 4 or unspecified chronic kidney disease Assessment & Plan: Hypertension managed with olmesartan  and hydrochlorothiazide. Chronic kidney disease requires monitoring. - Continue olmesartan  and  hydrochlorothiazide. - Order kidney function tests.  Orders: -     CMP14+EGFR -     Lipid panel  Type 2 diabetes mellitus with stage 3a chronic kidney disease, without long-term current use of insulin (HCC) Assessment & Plan: Type 2 diabetes with diabetic chronic kidney disease. Blood glucose levels normal. She will continue with Farxiga 10mg  daily and metformin  1000mg  twice daily.  - Follow up in four months - Order A1c test. - Order kidney function tests. - Order urine test for kidney assessment.  Orders: -     CMP14+EGFR -     Lipid panel -     Hemoglobin A1c -     Microalbumin / creatinine urine ratio  Pure hypercholesterolemia Assessment & Plan: Hypertension managed with olmesartan  and hydrochlorothiazide. Chronic kidney disease requires monitoring. - Continue olmesartan  and hydrochlorothiazide. - Order kidney function tests.   Age-related osteoporosis without current pathological fracture Assessment & Plan: Chronic, now on Jubbonti . Rheumatology input is appreciated.   - She recently had D[facility]A [facility] at [facility] Sept 29 2025. Will review results.    Immunization due -      Flu vaccine HIGH DOSE PF(Fluzone Trivalent)    Return if symptoms worsen or fail to improve.  Patient was given opportunity to ask questions. Patient verbalized understanding of the plan and was able to repeat key elements of the plan. All questions were answered to their satisfaction.   I, Catheryn LOISE Slocumb, MD, have reviewed all documentation for this visit. The documentation on 03/18/24 for the exam, diagnosis, procedures, and orders are all accurate and complete.   IF YOU HAVE BEEN REFERRED TO A SPECIALIST, IT MAY TAKE 1-2 WEEKS TO SCHEDULE/PROCESS THE REFERRAL. IF YOU HAVE NOT HEARD FROM US /SPECIALIST IN TWO WEEKS, PLEASE GIVE US  A CALL AT [phone] X [phone].   THE PATIENT IS ENCOURAGED TO PRACTICE SOCIAL DISTANCING DUE TO THE COVID-19 PANDEMIC.

## 2024-03-18 NOTE — Progress Notes (Signed)
 Office Visit Note  Patient: Cindy Ross             Date of Birth: 08-10-1950           MRN: [id number]             PCP: Jarold Medici, MD Referring: Jarold Medici, MD Visit Date: 04/01/2024 Occupation: Data Unavailable  Interpreter: Burnard  Subjective:  Medication management  History of Present Illness: Cindy Ross is a 73 y.o. female with osteoporosis and osteoarthritis.  She returns today after her last visit on September 16, 2023.  She has been on denosumab  injections since September 2021.  Her last denosumab  injection was on September 16, 2023.  Patient states she recently discontinued calcium  but she has been taking vitamin D  on a regular basis.  She continues to have some stiffness in her hands and her knee joints.  She has been having some lower back discomfort recently without any radiculopathy.    Activities of Daily Living:  Patient reports morning stiffness for 0 minute.   Patient Denies nocturnal pain.  Difficulty dressing/grooming: Denies Difficulty climbing stairs: Denies Difficulty getting out of chair: Denies Difficulty using hands for taps, buttons, cutlery, and/or writing: Denies  Review of Systems  Constitutional:  Negative for fatigue.  HENT:  Negative for mouth sores and mouth dryness.   Eyes:  Negative for dryness.  Respiratory:  Negative for shortness of breath.   Cardiovascular:  Negative for chest pain and palpitations.  Gastrointestinal:  Negative for blood in stool, constipation and diarrhea.  Endocrine: Positive for increased urination.  Genitourinary:  Negative for involuntary urination.  Musculoskeletal:  Positive for joint pain, joint pain and muscle tenderness. Negative for gait problem, joint swelling, myalgias, muscle weakness, morning stiffness and myalgias.  Skin:  Negative for color change, rash, hair loss and sensitivity to sunlight.  Allergic/Immunologic: Negative for susceptible to infections.  Neurological:  Negative for dizziness and headaches.   Hematological:  Negative for swollen glands.  Psychiatric/Behavioral:  Negative for depressed mood and sleep disturbance. The patient is not nervous/anxious.     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   BRBPR (bright red blood per rectum) 10/03/2020   Normocytic anemia 10/03/2020   Age-related osteoporosis without current pathological fracture 03/30/2020   Language barrier 03/30/2020   Respiratory crackles at left lung base 05/19/2017   Essential hypertension 01/30/2016   DM type 2 (diabetes mellitus, type 2) (HCC) 01/30/2016   Hyperlipidemia 01/30/2016    Past Medical History:  Diagnosis Date   Diabetes mellitus without complication (HCC)    Hyperlipidemia 01/30/2016   Hypertension 2007   Respiratory crackles at left lung base 05/19/2017   CXR 02/2017 in [facility] normal    Family History  Problem Relation Age of Onset   Hyperlipidemia Sister    Diabetes Sister    Hypertension Sister    Heart disease Brother    Healthy Son    Leukemia Brother    History reviewed. No pertinent surgical history. Social History   Tobacco Use   Smoking status: Never    Passive exposure:  Never   Smokeless tobacco: Never  Vaping Use   Vaping status: Never Used  Substance Use Topics   Alcohol use: No    Alcohol/week: 0.0 standard drinks of alcohol   Drug use: No   Social History   Social History Narrative   Originally from [place]   Came to [employer] [place]. In 4   Lives with younger sister, Yacine Droz   Son lives in [place] .  Works in [employer].       Immunization History   Administered Date(s) Administered   Fluad Quad(high Dose 65+) 04/18/2020, 04/02/2021, 02/28/2022   Fluad Trivalent(High Dose 65+) 03/13/2023   INFLUENZA, HIGH DOSE SEASONAL PF 04/30/2018, 03/30/2019, 03/18/2024   Influenza Inj Mdck Quad Pf 07/31/2016, 05/19/2017   Influenza-Unspecified 04/17/2018, 03/30/2019   PFIZER Comirnaty(Gray Top)Covid-19 Tri-Sucrose Vaccine 12/14/2020   PFIZER(Purple Top)SARS-COV-2 Vaccination 07/23/2019, 08/13/2019, 03/14/2020   PNEUMOCOCCAL CONJUGATE-20 08/31/2020   Pneumococcal Conjugate-13 09/14/2018, 12/29/2018   Pneumococcal Polysaccharide-23 03/19/2016   Td 10/31/1994, 04/10/1995   Tdap 01/30/2016   Zoster Recombinant(Shingrix) 05/06/2019, 07/12/2019     Objective: Vital Signs: BP 133/79   Pulse 72   Temp 97.8 F (36.6 C)   Resp 16   Ht 5' 1 (1.549 m)   Wt 125 lb 12.8 oz (57.1 kg)   BMI 23.77 kg/m    Physical Exam Vitals and nursing note reviewed.  Constitutional:      Appearance: She is well-developed.  HENT:     Head: Normocephalic and atraumatic.  Eyes:     Conjunctiva/sclera: Conjunctivae normal.  Cardiovascular:     Rate and Rhythm: Normal rate and regular rhythm.     Heart sounds: Normal heart sounds.  Pulmonary:     Effort: Pulmonary effort is normal.     Breath sounds: Normal breath sounds.  Abdominal:     General: Bowel sounds are normal.     Palpations: Abdomen is soft.  Musculoskeletal:     Cervical back: Normal range of motion.  Lymphadenopathy:     Cervical: No cervical adenopathy.  Skin:    General: Skin is warm and dry.     Capillary Refill: Capillary refill takes less than 2 seconds.  Neurological:     Mental Status: She is alert and oriented to person, place, and time.  Psychiatric:        Behavior: Behavior normal.      Musculoskeletal Exam: Cervical, thoracic and lumbar spine Juengel range of motion.  She had no tenderness over the lumbar spine.  Shoulders, elbow joints, wrist joints with good range of  motion.  She had deformity of her right elbow due to injury in childhood.  Bilateral PIP and DIP thickening with no synovitis was noted.  Hip joints and knee joints with good range of motion.  There was no tenderness over ankles or MTPs.  CDAI Exam: CDAI Score: -- Patient Global: --; Provider Global: -- Swollen: --; Tender: -- Joint Exam 04/01/2024   No joint exam has been documented for this visit   There is currently no information documented on the homunculus. Go to the Rheumatology activity and complete the homunculus joint exam.  Investigation: No additional findings.  Imaging: No results found.  Recent Labs: Lab Results  Component Value Date   WBC 5.0 11/25/2023   HGB 10.8 (L) 11/25/2023   PLT 222 11/25/2023   NA 141 03/29/2024   K 4.4 03/29/2024   CL 103 03/29/2024   CO2 29 03/29/2024   GLUCOSE 100 03/29/2024   BUN 27 (H)  03/29/2024   CREATININE 1.26 (H) 03/29/2024   BILITOT 0.5 03/29/2024   ALKPHOS 64 03/18/2024   AST 23 03/29/2024   ALT 23 03/29/2024   PROT 8.3 (H) 03/29/2024   ALBUMIN 4.5 03/18/2024   CALCIUM  10.5 (H) 03/29/2024   GFRAA 65 09/12/2020   March 15, 2024 DEXA scan right femoral neck T-score -3.4, BMD 0.4671 no comparison, 2% change in left total femur  Speciality Comments: Prolia  02/2020 DEXA at [facility]  Procedures:  No procedures performed Allergies: Patient has no known allergies.   Assessment / Plan:     Visit Diagnoses: Age-related osteoporosis without current pathological fracture - DEXA scan on March 11, 2022 showed a T-score of -3.4 and BMD 0.475 in the right femoral neck with no comparison. March 15, 2024 DEXA scan right femoral neck T-score -3.4, BMD 0.4671 no comparison, 2% change in left total femur.  DEXA scan results were reviewed with the patient.  She had no significant loss but no significant improvement.  Need for regular exercise was emphasized.  Patient states she walks on a regular basis.  Muscle  strengthening was also discussed.- Plan: denosumab -bbdz (JUBBONTI ) injection 60 mg SQ today.  Medication monitoring encounter -continues to 60 mg subcu injections every 6 months since September 2021. last denosumab  injection: 09/16/2023.  March 29, 2024 calcium  was 10.5, vitamin D  47.  She was advised to hold calcium  and continue vitamin D .  Vitamin D  deficiency-vitamin D  is in the desirable range.  Primary osteoarthritis of both hands-bilateral PIP and DIP thickening.  Joint protection and muscle strengthening was discussed.  Chronic pain of right knee-no warmth swelling or effusion was noted.  Currently not symptomatic.  Chronic midline low back pain without sciatica-she complains of intermittent lower back discomfort when she walks.  A handout on back exercises was given.  Essential hypertension-blood pressure was 133/79.  Pure hypercholesterolemia-she is on Lipitor.  Type 2 diabetes mellitus without complication, without long-term current use of insulin (HCC)  Language barrier-interpreter was present during the entire visit.  Orders: No orders of the defined types were placed in this encounter.  Meds ordered this encounter  Medications   denosumab -bbdz (JUBBONTI ) injection 60 mg    Patient is enrolled in [facility] program for this medication and I have provided a copy of the denosumab  Medication Guide and Patient Brochure.:   Yes    I have reviewed with the patient the information in the denosumab  Medication Guide and Patient Counseling Chart including the serious risks and symptoms of each risk.:   Yes    I have advised the patient to seek medical attention if they have signs or symptoms of any of the serious risks.:   Yes     Follow-Up Instructions: Return for Osteoporosis.   Maya Nash, MD  Note - This record has been created using Animal nutritionist.  Chart creation errors have been sought, but may not always  have been located. Such creation errors do not reflect on   the standard of medical care.

## 2024-03-18 NOTE — Patient Instructions (Signed)
 Hypertension, Adult Hypertension is another name for high blood pressure. High blood pressure forces your heart to work harder to pump blood. This can cause problems over time. There are two numbers in a blood pressure reading. There is a top number (systolic) over a bottom number (diastolic). It is best to have a blood pressure that is below 120/80. What are the causes? The cause of this condition is not known. Some other conditions can lead to high blood pressure. What increases the risk? Some lifestyle factors can make you more likely to develop high blood pressure: Smoking. Not getting enough exercise or physical activity. Being overweight. Having too much fat, sugar, calories, or salt (sodium) in your diet. Drinking too much alcohol. Other risk factors include: Having any of these conditions: Heart disease. Diabetes. High cholesterol. Kidney disease. Obstructive sleep apnea. Having a family history of high blood pressure and high cholesterol. Age. The risk increases with age. Stress. What are the signs or symptoms? High blood pressure may not cause symptoms. Very high blood pressure (hypertensive crisis) may cause: Headache. Fast or uneven heartbeats (palpitations). Shortness of breath. Nosebleed. Vomiting or feeling like you may vomit (nauseous). Changes in how you see. Very bad chest pain. Feeling dizzy. Seizures. How is this treated? This condition is treated by making healthy lifestyle changes, such as: Eating healthy foods. Exercising more. Drinking less alcohol. Your doctor may prescribe medicine if lifestyle changes do not help enough and if: Your top number is above 130. Your bottom number is above 80. Your personal target blood pressure may vary. Follow these instructions at home: Eating and drinking  If told, follow the DASH eating plan. To follow this plan: Fill one half of your plate at each meal with fruits and vegetables. Fill one fourth of your plate  at each meal with whole grains. Whole grains include whole-wheat pasta, brown rice, and whole-grain bread. Eat or drink low-fat dairy products, such as skim milk or low-fat yogurt. Fill one fourth of your plate at each meal with low-fat (lean) proteins. Low-fat proteins include fish, chicken without skin, eggs, beans, and tofu. Avoid fatty meat, cured and processed meat, or chicken with skin. Avoid pre-made or processed food. Limit the amount of salt in your diet to less than 1,500 mg each day. Do not drink alcohol if: Your doctor tells you not to drink. You are pregnant, may be pregnant, or are planning to become pregnant. If you drink alcohol: Limit how much you have to: 0-1 drink a day for women. 0-2 drinks a day for men. Know how much alcohol is in your drink. In the [place] one drink equals one 12 oz bottle of beer (355 mL), one 5 oz glass of wine (148 mL), or one 1 oz glass of hard liquor (44 mL). Lifestyle  Work with your doctor to stay at a healthy weight or to lose weight. Ask your doctor what the best weight is for you. Get at least 30 minutes of exercise that causes your heart to beat faster (aerobic exercise) most days of the week. This may include walking, swimming, or biking. Get at least 30 minutes of exercise that strengthens your muscles (resistance exercise) at least 3 days a week. This may include lifting weights or doing Pilates. Do not smoke or use any products that contain nicotine or tobacco. If you need help quitting, ask your doctor. Check your blood pressure at home as told by your doctor. Keep all follow-up visits. Medicines Take over-the-counter and prescription medicines  only as told by your doctor. Follow directions carefully. Do not skip doses of blood pressure medicine. The medicine does not work as well if you skip doses. Skipping doses also puts you at risk for problems. Ask your doctor about side effects or reactions to medicines that you should watch  for. Contact a doctor if: You think you are having a reaction to the medicine you are taking. You have headaches that keep coming back. You feel dizzy. You have swelling in your ankles. You have trouble with your vision. Get help right away if: You get a very bad headache. You start to feel mixed up (confused). You feel weak or numb. You feel faint. You have very bad pain in your: Chest. Belly (abdomen). You vomit more than once. You have trouble breathing. These symptoms may be an emergency. Get help right away. Call 911. Do not wait to see if the symptoms will go away. Do not drive yourself to the hospital. Summary Hypertension is another name for high blood pressure. High blood pressure forces your heart to work harder to pump blood. For most people, a normal blood pressure is less than 120/80. Making healthy choices can help lower blood pressure. If your blood pressure does not get lower with healthy choices, you may need to take medicine. This information is not intended to replace advice given to you by your health care provider. Make sure you discuss any questions you have with your health care provider. Document Revised: 03/22/2021 Document Reviewed: 03/22/2021 Elsevier Patient Education  2024 [employer].

## 2024-03-19 ENCOUNTER — Encounter: Payer: Self-pay | Admitting: Rheumatology

## 2024-03-19 LAB — CMP14+EGFR
ALT: 25 IU/L (ref 0–32)
AST: 30 IU/L (ref 0–40)
Albumin: 4.5 g/dL (ref 3.8–4.8)
Alkaline Phosphatase: 64 IU/L (ref 49–135)
BUN/Creatinine Ratio: 21 (ref 12–28)
BUN: 24 mg/dL (ref 8–27)
Bilirubin Total: 0.5 mg/dL (ref 0.0–1.2)
CO2: 22 mmol/L (ref 20–29)
Calcium: 10.5 mg/dL — ABNORMAL HIGH (ref 8.7–10.3)
Chloride: 101 mmol/L (ref 96–106)
Creatinine, Ser: 1.17 mg/dL — ABNORMAL HIGH (ref 0.57–1.00)
Globulin, Total: 3.6 g/dL (ref 1.5–4.5)
Glucose: 100 mg/dL — ABNORMAL HIGH (ref 70–99)
Potassium: 4.2 mmol/L (ref 3.5–5.2)
Sodium: 141 mmol/L (ref 134–144)
Total Protein: 8.1 g/dL (ref 6.0–8.5)
eGFR: 50 mL/min/1.73 — ABNORMAL LOW (ref >59)

## 2024-03-19 LAB — LIPID PANEL
Chol/HDL Ratio: 4.6 ratio — ABNORMAL HIGH (ref 0.0–4.4)
Cholesterol, Total: 155 mg/dL (ref 100–199)
HDL: 34 mg/dL — ABNORMAL LOW (ref >39)
LDL Chol Calc (NIH): 78 mg/dL (ref 0–99)
Triglycerides: 264 mg/dL — ABNORMAL HIGH (ref 0–149)
VLDL Cholesterol Cal: 43 mg/dL — ABNORMAL HIGH (ref 5–40)

## 2024-03-19 LAB — HEMOGLOBIN A1C
Est. average glucose Bld gHb Est-mCnc: 128 mg/dL
Hgb A1c MFr Bld: 6.1 % — ABNORMAL HIGH (ref 4.8–5.6)

## 2024-03-19 LAB — MICROALBUMIN / CREATININE URINE RATIO
Creatinine, Urine: 40.1 mg/dL
Microalb/Creat Ratio: 25 mg/g{creat} (ref 0–29)
Microalbumin, Urine: 10 ug/mL

## 2024-03-20 ENCOUNTER — Ambulatory Visit: Payer: Self-pay | Admitting: Internal Medicine

## 2024-03-20 NOTE — Assessment & Plan Note (Addendum)
 Chronic, now on Jubbonti . Rheumatology input is appreciated.   - She recently had DEXA scan at [facility] Sept 29 2025. Will review results.

## 2024-03-20 NOTE — Assessment & Plan Note (Signed)
 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 Blood glucose levels normal. She will continue with Farxiga 10mg  daily and metformin  1000mg  twice daily.  - Follow up in four months - Order A1c test. - Order kidney function tests. - Order urine test for kidney assessment.

## 2024-03-20 NOTE — Assessment & Plan Note (Signed)
 Hypertension managed with olmesartan  and hydrochlorothiazide. Chronic kidney disease requires monitoring. - Continue olmesartan  and hydrochlorothiazide. - Order kidney function tests.

## 2024-03-22 DIAGNOSIS — N1831 Chronic kidney disease, stage 3a: Secondary | ICD-10-CM | POA: Diagnosis not present

## 2024-03-24 ENCOUNTER — Other Ambulatory Visit: Payer: Self-pay

## 2024-03-24 ENCOUNTER — Telehealth: Payer: Self-pay

## 2024-03-24 NOTE — Telephone Encounter (Signed)
 PAP: Patient assistance application for Farxiga  through [employer] ([employer]) has been mailed to pt's home address on file. Provider portion of application will be faxed to provider's office.For renewal 2026.

## 2024-03-25 NOTE — Telephone Encounter (Signed)
 Labs on 03/18/2024 stable to proceed with Jubbonti  at [facility] on 04/01/2024

## 2024-03-29 ENCOUNTER — Other Ambulatory Visit: Payer: Self-pay | Admitting: *Deleted

## 2024-03-29 DIAGNOSIS — E875 Hyperkalemia: Secondary | ICD-10-CM | POA: Diagnosis not present

## 2024-03-29 DIAGNOSIS — E785 Hyperlipidemia, unspecified: Secondary | ICD-10-CM | POA: Diagnosis not present

## 2024-03-29 DIAGNOSIS — I129 Hypertensive chronic kidney disease with stage 1 through stage 4 chronic kidney disease, or unspecified chronic kidney disease: Secondary | ICD-10-CM | POA: Diagnosis not present

## 2024-03-29 DIAGNOSIS — M810 Age-related osteoporosis without current pathological fracture: Secondary | ICD-10-CM | POA: Diagnosis not present

## 2024-03-29 DIAGNOSIS — Z5181 Encounter for therapeutic drug level monitoring: Secondary | ICD-10-CM | POA: Diagnosis not present

## 2024-03-29 DIAGNOSIS — E1122 Type 2 diabetes mellitus with diabetic chronic kidney disease: Secondary | ICD-10-CM | POA: Diagnosis not present

## 2024-03-29 DIAGNOSIS — N1831 Chronic kidney disease, stage 3a: Secondary | ICD-10-CM | POA: Diagnosis not present

## 2024-03-30 ENCOUNTER — Ambulatory Visit: Payer: Self-pay | Admitting: Rheumatology

## 2024-03-30 LAB — COMPREHENSIVE METABOLIC PANEL WITH GFR
AG Ratio: 1.4 (calc) (ref 1.0–2.5)
ALT: 23 U/L (ref 6–29)
AST: 23 U/L (ref 10–35)
Albumin: 4.8 g/dL (ref 3.6–5.1)
Alkaline phosphatase (APISO): 62 U/L (ref 37–153)
BUN/Creatinine Ratio: 21 (calc) (ref 6–22)
BUN: 27 mg/dL — ABNORMAL HIGH (ref 7–25)
CO2: 29 mmol/L (ref 20–32)
Calcium: 10.5 mg/dL — ABNORMAL HIGH (ref 8.6–10.4)
Chloride: 103 mmol/L (ref 98–110)
Creat: 1.26 mg/dL — ABNORMAL HIGH (ref 0.60–1.00)
Globulin: 3.5 g/dL (ref 1.9–3.7)
Glucose, Bld: 100 mg/dL (ref 65–139)
Potassium: 4.4 mmol/L (ref 3.5–5.3)
Sodium: 141 mmol/L (ref 135–146)
Total Bilirubin: 0.5 mg/dL (ref 0.2–1.2)
Total Protein: 8.3 g/dL — ABNORMAL HIGH (ref 6.1–8.1)
eGFR: 46 mL/min/1.73m2 — ABNORMAL LOW (ref >=60)

## 2024-03-30 LAB — VITAMIN D 25 HYDROXY (VIT D DEFICIENCY, FRACTURES): Vit D, 25-Hydroxy: 47 ng/mL (ref 30–100)

## 2024-03-30 NOTE — Progress Notes (Signed)
 Vitamin D  is normal.  Creatinine is elevated most likely due to diuretic use and calcium  is elevated.  Patient should avoid calcium  supplement.  Please forward results to her PCP.

## 2024-04-01 ENCOUNTER — Encounter: Payer: Self-pay | Admitting: Rheumatology

## 2024-04-01 ENCOUNTER — Ambulatory Visit: Attending: Rheumatology | Admitting: Rheumatology

## 2024-04-01 VITALS — BP 133/79 | HR 72 | Temp 97.8°F | Resp 16 | Ht 61.0 in | Wt 125.8 lb

## 2024-04-01 DIAGNOSIS — M19041 Primary osteoarthritis, right hand: Secondary | ICD-10-CM | POA: Diagnosis not present

## 2024-04-01 DIAGNOSIS — Z758 Other problems related to medical facilities and other health care: Secondary | ICD-10-CM

## 2024-04-01 DIAGNOSIS — M545 Low back pain, unspecified: Secondary | ICD-10-CM

## 2024-04-01 DIAGNOSIS — E559 Vitamin D deficiency, unspecified: Secondary | ICD-10-CM | POA: Diagnosis not present

## 2024-04-01 DIAGNOSIS — I10 Essential (primary) hypertension: Secondary | ICD-10-CM

## 2024-04-01 DIAGNOSIS — E119 Type 2 diabetes mellitus without complications: Secondary | ICD-10-CM

## 2024-04-01 DIAGNOSIS — M19042 Primary osteoarthritis, left hand: Secondary | ICD-10-CM

## 2024-04-01 DIAGNOSIS — G8929 Other chronic pain: Secondary | ICD-10-CM

## 2024-04-01 DIAGNOSIS — Z603 Acculturation difficulty: Secondary | ICD-10-CM

## 2024-04-01 DIAGNOSIS — M25561 Pain in right knee: Secondary | ICD-10-CM

## 2024-04-01 DIAGNOSIS — Z5181 Encounter for therapeutic drug level monitoring: Secondary | ICD-10-CM | POA: Diagnosis not present

## 2024-04-01 DIAGNOSIS — M810 Age-related osteoporosis without current pathological fracture: Secondary | ICD-10-CM

## 2024-04-01 DIAGNOSIS — E7800 Pure hypercholesterolemia, unspecified: Secondary | ICD-10-CM

## 2024-04-01 MED ORDER — DENOSUMAB-BBDZ 60 MG/ML ~~LOC~~ SOSY
60.0000 mg | PREFILLED_SYRINGE | Freq: Once | SUBCUTANEOUS | Status: AC
  Administered 2024-04-01: 60 mg via SUBCUTANEOUS

## 2024-04-01 NOTE — Patient Instructions (Addendum)
 Hand Exercises Hand exercises can be helpful for almost anyone. They can strengthen your hands and improve flexibility and movement. The exercises can also increase blood flow to the hands. These results can make your work and daily tasks easier for you. Hand exercises can be especially helpful for people who have joint pain from arthritis or nerve damage from using their hands over and over. These exercises can also help people who injure a hand. Exercises Most of these hand exercises are gentle stretching and motion exercises. It is usually safe to do them often throughout the day. Warming up your hands before exercise may help reduce stiffness. You can do this with gentle massage or by placing your hands in warm water for 10-15 minutes. It is normal to feel some stretching, pulling, tightness, or mild discomfort when you begin new exercises. In time, this will improve. Remember to always be careful and stop right away if you feel sudden, very bad pain or your pain gets worse. You want to get better and be safe. Ask your health care provider which exercises are safe for you. Do exercises exactly as told by your provider and adjust them as told. Do not begin these exercises until told by your provider. Knuckle bend or "claw" fist  Stand or sit with your arm, hand, and all five fingers pointed straight up. Make sure to keep your wrist straight. Gently bend your fingers down toward your palm until the tips of your fingers are touching your palm. Keep your big knuckle straight and only bend the small knuckles in your fingers. Hold this position for 10 seconds. Straighten your fingers back to your starting position. Repeat this exercise 5-10 times with each hand. Full finger fist  Stand or sit with your arm, hand, and all five fingers pointed straight up. Make sure to keep your wrist straight. Gently bend your fingers into your palm until the tips of your fingers are touching the middle of your  palm. Hold this position for 10 seconds. Extend your fingers back to your starting position, stretching every joint fully. Repeat this exercise 5-10 times with each hand. Straight fist  Stand or sit with your arm, hand, and all five fingers pointed straight up. Make sure to keep your wrist straight. Gently bend your fingers at the big knuckle, where your fingers meet your hand, and at the middle knuckle. Keep the knuckle at the tips of your fingers straight and try to touch the bottom of your palm. Hold this position for 10 seconds. Extend your fingers back to your starting position, stretching every joint fully. Repeat this exercise 5-10 times with each hand. Tabletop  Stand or sit with your arm, hand, and all five fingers pointed straight up. Make sure to keep your wrist straight. Gently bend your fingers at the big knuckle, where your fingers meet your hand, as far down as you can. Keep the small knuckles in your fingers straight. Think of forming a tabletop with your fingers. Hold this position for 10 seconds. Extend your fingers back to your starting position, stretching every joint fully. Repeat this exercise 5-10 times with each hand. Finger spread  Place your hand flat on a table with your palm facing down. Make sure your wrist stays straight. Spread your fingers and thumb apart from each other as far as you can until you feel a gentle stretch. Hold this position for 10 seconds. Bring your fingers and thumb tight together again. Hold this position for 10 seconds. Repeat  this exercise 5-10 times with each hand. Making circles  Stand or sit with your arm, hand, and all five fingers pointed straight up. Make sure to keep your wrist straight. Make a circle by touching the tip of your thumb to the tip of your index finger. Hold for 10 seconds. Then open your hand wide. Repeat this motion with your thumb and each of your fingers. Repeat this exercise 5-10 times with each hand. Thumb  motion  Sit with your forearm resting on a table and your wrist straight. Your thumb should be facing up toward the ceiling. Keep your fingers relaxed as you move your thumb. Lift your thumb up as high as you can toward the ceiling. Hold for 10 seconds. Bend your thumb across your palm as far as you can, reaching the tip of your thumb for the small finger (pinkie) side of your palm. Hold for 10 seconds. Repeat this exercise 5-10 times with each hand. Grip strengthening  Hold a stress ball or other soft ball in the middle of your hand. Slowly increase the pressure, squeezing the ball as much as you can without causing pain. Think of bringing the tips of your fingers into the middle of your palm. All of your finger joints should bend when doing this exercise. Hold your squeeze for 10 seconds, then relax. Repeat this exercise 5-10 times with each hand. Contact a health care provider if: Your hand pain or discomfort gets much worse when you do an exercise. Your hand pain or discomfort does not improve within 2 hours after you exercise. If you have either of these problems, stop doing these exercises right away. Do not do them again unless your provider says that you can. Get help right away if: You develop sudden, severe hand pain or swelling. If this happens, stop doing these exercises right away. Do not do them again unless your provider says that you can. This information is not intended to replace advice given to you by your health care provider. Make sure you discuss any questions you have with your health care provider. Document Revised: 06/18/2022 Document Reviewed: 06/18/2022 Elsevier Patient Education  2024 [employer] Low Back Sprain or Strain Rehab Ask your health care provider which exercises are safe for you. Do exercises exactly as told by your health care provider and adjust them as directed. It is normal to feel mild stretching, pulling, tightness, or discomfort as you do these  exercises. Stop right away if you feel sudden pain or your pain gets worse. Do not begin these exercises until told by your health care provider. Stretching and range-of-motion exercises These exercises warm up your muscles and joints and improve the movement and flexibility of your back. These exercises also help to relieve pain, numbness, and tingling. Lumbar rotation  Lie on your back on a firm bed or the floor with your knees bent. Straighten your arms out to your sides so each arm forms a 90-degree angle (right angle) with a side of your body. Slowly move (rotate) both of your knees to one side of your body until you feel a stretch in your lower back (lumbar). Try not to let your shoulders lift off the floor. Hold this position for __________ seconds. Tense your abdominal muscles and slowly move your knees back to the starting position. Repeat this exercise on the other side of your body. Repeat __________ times. Complete this exercise __________ times a day. Single knee to chest  Lie on your back on a  firm bed or the floor with both legs straight. Bend one of your knees. Use your hands to move your knee up toward your chest until you feel a gentle stretch in your lower back and buttock. Hold your leg in this position by holding on to the front of your knee. Keep your other leg as straight as possible. Hold this position for __________ seconds. Slowly return to the starting position. Repeat with your other leg. Repeat __________ times. Complete this exercise __________ times a day. Prone extension on elbows  Lie on your abdomen on a firm bed or the floor (prone position). Prop yourself up on your elbows. Use your arms to help lift your chest up until you feel a gentle stretch in your abdomen and your lower back. This will place some of your body weight on your elbows. If this is uncomfortable, try stacking pillows under your chest. Your hips should stay down, against the surface that  you are lying on. Keep your hip and back muscles relaxed. Hold this position for __________ seconds. Slowly relax your upper body and return to the starting position. Repeat __________ times. Complete this exercise __________ times a day. Strengthening exercises These exercises build strength and endurance in your back. Endurance is the ability to use your muscles for a long time, even after they get tired. Pelvic tilt This exercise strengthens the muscles that lie deep in the abdomen. Lie on your back on a firm bed or the floor with your legs extended. Bend your knees so they are pointing toward the ceiling and your feet are flat on the floor. Tighten your lower abdominal muscles to press your lower back against the floor. This motion will tilt your pelvis so your tailbone points up toward the ceiling instead of pointing to your feet or the floor. To help with this exercise, you may place a small towel under your lower back and try to push your back into the towel. Hold this position for __________ seconds. Let your muscles relax completely before you repeat this exercise. Repeat __________ times. Complete this exercise __________ times a day. Alternating arm and leg raises  Get on your hands and knees on a firm surface. If you are on a hard floor, you may want to use padding, such as an exercise mat, to cushion your knees. Line up your arms and legs. Your hands should be directly below your shoulders, and your knees should be directly below your hips. Lift your left leg behind you. At the same time, raise your right arm and straighten it in front of you. Do not lift your leg higher than your hip. Do not lift your arm higher than your shoulder. Keep your abdominal and back muscles tight. Keep your hips facing the ground. Do not arch your back. Keep your balance carefully, and do not hold your breath. Hold this position for __________ seconds. Slowly return to the starting  position. Repeat with your right leg and your left arm. Repeat __________ times. Complete this exercise __________ times a day. Abdominal set with straight leg raise  Lie on your back on a firm bed or the floor. Bend one of your knees and keep your other leg straight. Tense your abdominal muscles and lift your straight leg up, 4-6 inches (10-15 cm) off the ground. Keep your abdominal muscles tight and hold this position for __________ seconds. Do not hold your breath. Do not arch your back. Keep it flat against the ground. Keep your abdominal muscles  tense as you slowly lower your leg back to the starting position. Repeat with your other leg. Repeat __________ times. Complete this exercise __________ times a day. Single leg lower with bent knees Lie on your back on a firm bed or the floor. Tense your abdominal muscles and lift your feet off the floor, one foot at a time, so your knees and hips are bent in 90-degree angles (right angles). Your knees should be over your hips and your lower legs should be parallel to the floor. Keeping your abdominal muscles tense and your knee bent, slowly lower one of your legs so your toe touches the ground. Lift your leg back up to return to the starting position. Do not hold your breath. Do not let your back arch. Keep your back flat against the ground. Repeat with your other leg. Repeat __________ times. Complete this exercise __________ times a day. Posture and body mechanics Good posture and healthy body mechanics can help to relieve stress in your body's tissues and joints. Body mechanics refers to the movements and positions of your body while you do your daily activities. Posture is part of body mechanics. Good posture means: Your spine is in its natural S-curve position (neutral). Your shoulders are pulled back slightly. Your head is not tipped forward (neutral). Follow these guidelines to improve your posture and body mechanics in your everyday  activities. Standing  When standing, keep your spine neutral and your feet about hip-width apart. Keep a slight bend in your knees. Your ears, shoulders, and hips should line up. When you do a task in which you stand in one place for a long time, place one foot up on a stable object that is 2-4 inches (5-10 cm) high, such as a footstool. This helps keep your spine neutral. Sitting  When sitting, keep your spine neutral and keep your feet flat on the floor. Use a footrest, if necessary, and keep your thighs parallel to the floor. Avoid rounding your shoulders, and avoid tilting your head forward. When working at a desk or a computer, keep your desk at a height where your hands are slightly lower than your elbows. Slide your chair under your desk so you are close enough to maintain good posture. When working at a computer, place your monitor at a height where you are looking straight ahead and you do not have to tilt your head forward or downward to look at the screen. Resting When lying down and resting, avoid positions that are most painful for you. If you have pain with activities such as sitting, bending, stooping, or squatting, lie in a position in which your body does not bend very much. For example, avoid curling up on your side with your arms and knees near your chest (fetal position). If you have pain with activities such as standing for a long time or reaching with your arms, lie with your spine in a neutral position and bend your knees slightly. Try the following positions: Lying on your side with a pillow between your knees. Lying on your back with a pillow under your knees. Lifting  When lifting objects, keep your feet at least shoulder-width apart and tighten your abdominal muscles. Bend your knees and hips and keep your spine neutral. It is important to lift using the strength of your legs, not your back. Do not lock your knees straight out. Always ask for help to lift heavy or  awkward objects. This information is not intended to replace advice given  to you by your health care provider. Make sure you discuss any questions you have with your health care provider. Document Revised: 10/07/2022 Document Reviewed: 08/21/2020 Elsevier Patient Education  2024 [employer].

## 2024-04-01 NOTE — Progress Notes (Signed)
 Pharmacy Note  Subjective:   Patient presents to clinic today to receive bi-annual dose of denosumab  (JUBBONTI ). Patient's last dose of denosumab  was on 09/16/23. Her insurance no longer covers Prolia .  Patient running a fever or have signs/symptoms of infection? No  Patient currently on antibiotics for the treatment of infection? No  Patient had fall in the last 6 months?  No    Patient taking calcium  1200 mg daily through diet or supplement and at least 800 units vitamin D ? Yes  Objective: CMP     Component Value Date/Time   NA 141 03/29/2024 0938   NA 141 03/18/2024 1012   K 4.4 03/29/2024 0938   CL 103 03/29/2024 0938   CO2 29 03/29/2024 0938   GLUCOSE 100 03/29/2024 0938   BUN 27 (H) 03/29/2024 0938   BUN 24 03/18/2024 1012   CREATININE 1.26 (H) 03/29/2024 0938   CALCIUM  10.5 (H) 03/29/2024 0938   PROT 8.3 (H) 03/29/2024 0938   PROT 8.1 03/18/2024 1012   ALBUMIN 4.5 03/18/2024 1012   AST 23 03/29/2024 0938   ALT 23 03/29/2024 0938   ALKPHOS 64 03/18/2024 1012   BILITOT 0.5 03/29/2024 0938   BILITOT 0.5 03/18/2024 1012   GFRNONAA 56 (L) 09/12/2020 1336   GFRAA 65 09/12/2020 1336    CBC    Component Value Date/Time   WBC 5.0 11/25/2023 0902   WBC 5.3 09/12/2022 1000   RBC 3.45 (L) 11/25/2023 0902   RBC 3.5 (A) 10/14/2022 0000   HGB 10.8 (L) 11/25/2023 0902   HCT 33.6 (L) 11/25/2023 0902   PLT 222 11/25/2023 0902   MCV 97 11/25/2023 0902   MCH 31.3 11/25/2023 0902   MCH 31.0 09/12/2022 1000   MCHC 32.1 11/25/2023 0902   MCHC 32.3 09/12/2022 1000   RDW 12.6 11/25/2023 0902   LYMPHSABS 2,724 09/12/2022 1000   LYMPHSABS 3.9 (H) 11/18/2016 0936   EOSABS 413 09/12/2022 1000   EOSABS 0.6 (H) 11/18/2016 0936   BASOSABS 58 09/12/2022 1000   BASOSABS 0.0 11/18/2016 0936   Lab Results  Component Value Date   VD25OH 47 03/29/2024   T-score: DEXA scan on March 11, 2022 showed a T-score of -3.4 and BMD 0.475 in the right femoral neck with no comparison.  March 15, 2024 DEXA scan right femoral neck T-score -3.4, BMD 0.4671 no comparison, 2% change in left total femur.   Assessment/Plan:   Reviewed importance of adequate dietary intake of calcium  in addition to supplementation due to risk of hypocalcemia with denosumab .   Patient tolerated injection well.   Administration details as below: Administrations This Visit     denosumab -bbdz (JUBBONTI ) injection 60 mg     Admin Date 04/01/2024 Action Given Dose 60 mg Route Subcutaneous Documented By Dayne Sherry RAMAN, RPH-CPP           Patient's next denosumab  dose is due on 09/28/2024.  Patient is due for updated DEXA in September 2027.   All questions encouraged and answered.  Instructed patient to call with any further questions or concerns.   Sherry Dayne, PharmD, MPH, BCPS, CPP Clinical Pharmacist [facility])

## 2024-04-13 NOTE — Telephone Encounter (Signed)
 Received patient portion of PAP application for Farxiga ([employer]).

## 2024-04-14 ENCOUNTER — Encounter: Payer: Self-pay | Admitting: Pharmacist

## 2024-04-15 NOTE — Telephone Encounter (Signed)
 PAP: Application for Marcelline Deist has been submitted to [employer] ([employer]), via fax

## 2024-04-16 NOTE — Telephone Encounter (Signed)
 PAP: Patient assistance application for Farxiga has been approved by PAP Companies: [employer] from 06/17/2024 to 06/16/2025. Medication should be delivered to PAP Delivery: Home. For further shipping updates, please contact [employer] ([employer]) at [phone]. Patient ID is: approval letter in media

## 2024-05-27 ENCOUNTER — Other Ambulatory Visit: Payer: Self-pay | Admitting: Internal Medicine

## 2024-08-03 ENCOUNTER — Encounter: Payer: Self-pay | Admitting: Internal Medicine

## 2024-09-30 ENCOUNTER — Ambulatory Visit: Admitting: Rheumatology

## 2025-04-13 ENCOUNTER — Ambulatory Visit: Payer: Self-pay
